# Patient Record
Sex: Female | Born: 1970 | Race: White | Hispanic: No | Marital: Single | Smoking: Current every day smoker
Health system: Southern US, Community
[De-identification: ages and names within clinical notes are randomized; demographics above are authoritative.]

## PROBLEM LIST (undated history)

## (undated) DIAGNOSIS — I8500 Esophageal varices without bleeding: Secondary | ICD-10-CM

## (undated) DIAGNOSIS — J45909 Unspecified asthma, uncomplicated: Secondary | ICD-10-CM

## (undated) DIAGNOSIS — R161 Splenomegaly, not elsewhere classified: Secondary | ICD-10-CM

## (undated) DIAGNOSIS — T7840XA Allergy, unspecified, initial encounter: Secondary | ICD-10-CM

## (undated) DIAGNOSIS — R059 Cough, unspecified: Secondary | ICD-10-CM

## (undated) DIAGNOSIS — G43909 Migraine, unspecified, not intractable, without status migrainosus: Secondary | ICD-10-CM

## (undated) DIAGNOSIS — F1010 Alcohol abuse, uncomplicated: Secondary | ICD-10-CM

## (undated) DIAGNOSIS — F32A Depression, unspecified: Secondary | ICD-10-CM

## (undated) DIAGNOSIS — D696 Thrombocytopenia, unspecified: Secondary | ICD-10-CM

## (undated) DIAGNOSIS — F419 Anxiety disorder, unspecified: Secondary | ICD-10-CM

## (undated) DIAGNOSIS — E119 Type 2 diabetes mellitus without complications: Secondary | ICD-10-CM

## (undated) DIAGNOSIS — E669 Obesity, unspecified: Secondary | ICD-10-CM

## (undated) DIAGNOSIS — A630 Anogenital (venereal) warts: Secondary | ICD-10-CM

## (undated) DIAGNOSIS — K7460 Unspecified cirrhosis of liver: Secondary | ICD-10-CM

## (undated) DIAGNOSIS — M1990 Unspecified osteoarthritis, unspecified site: Secondary | ICD-10-CM

## (undated) DIAGNOSIS — K8020 Calculus of gallbladder without cholecystitis without obstruction: Secondary | ICD-10-CM

## (undated) DIAGNOSIS — K219 Gastro-esophageal reflux disease without esophagitis: Secondary | ICD-10-CM

## (undated) DIAGNOSIS — I10 Essential (primary) hypertension: Secondary | ICD-10-CM

## (undated) DIAGNOSIS — G5603 Carpal tunnel syndrome, bilateral upper limbs: Secondary | ICD-10-CM

## (undated) HISTORY — DX: Calculus of gallbladder without cholecystitis without obstruction: K80.20

## (undated) HISTORY — DX: Unspecified osteoarthritis, unspecified site: M19.90

## (undated) HISTORY — DX: Gastro-esophageal reflux disease without esophagitis: K21.9

## (undated) HISTORY — DX: Unspecified asthma, uncomplicated: J45.909

## (undated) HISTORY — DX: Allergy, unspecified, initial encounter: T78.40XA

## (undated) HISTORY — DX: Anogenital (venereal) warts: A63.0

## (undated) HISTORY — DX: Alcohol abuse, uncomplicated: F10.10

## (undated) HISTORY — DX: Migraine, unspecified, not intractable, without status migrainosus: G43.909

## (undated) HISTORY — DX: Anxiety disorder, unspecified: F41.9

## (undated) HISTORY — DX: Obesity, unspecified: E66.9

## (undated) HISTORY — DX: Depression, unspecified: F32.A

## (undated) HISTORY — DX: Type 2 diabetes mellitus without complications: E11.9

---

## 2007-11-18 ENCOUNTER — Other Ambulatory Visit: Payer: Self-pay

## 2007-11-18 ENCOUNTER — Emergency Department: Payer: Self-pay | Admitting: Emergency Medicine

## 2007-11-22 ENCOUNTER — Emergency Department: Payer: Self-pay | Admitting: Emergency Medicine

## 2012-01-30 ENCOUNTER — Emergency Department: Payer: Self-pay | Admitting: Emergency Medicine

## 2012-02-22 ENCOUNTER — Ambulatory Visit: Payer: Self-pay

## 2015-02-05 ENCOUNTER — Encounter: Payer: Self-pay | Admitting: Emergency Medicine

## 2015-02-05 ENCOUNTER — Emergency Department
Admission: EM | Admit: 2015-02-05 | Discharge: 2015-02-05 | Disposition: A | Discharge status: Home / Self Care | Payer: Self-pay | Attending: Emergency Medicine | Admitting: Emergency Medicine

## 2015-02-05 ENCOUNTER — Emergency Department: Payer: Self-pay

## 2015-02-05 DIAGNOSIS — E1165 Type 2 diabetes mellitus with hyperglycemia: Secondary | ICD-10-CM | POA: Insufficient documentation

## 2015-02-05 DIAGNOSIS — Z880 Allergy status to penicillin: Secondary | ICD-10-CM | POA: Insufficient documentation

## 2015-02-05 DIAGNOSIS — I10 Essential (primary) hypertension: Secondary | ICD-10-CM | POA: Insufficient documentation

## 2015-02-05 DIAGNOSIS — Z720 Tobacco use: Secondary | ICD-10-CM | POA: Insufficient documentation

## 2015-02-05 HISTORY — DX: Essential (primary) hypertension: I10

## 2015-02-05 LAB — CBC WITH DIFFERENTIAL/PLATELET
BASOS PCT: 1 %
Basophils Absolute: 0.1 10*3/uL (ref 0–0.1)
EOS ABS: 0.1 10*3/uL (ref 0–0.7)
EOS PCT: 1 %
HCT: 44.1 % (ref 35.0–47.0)
Hemoglobin: 14.9 g/dL (ref 12.0–16.0)
Lymphocytes Relative: 33 %
Lymphs Abs: 2.5 10*3/uL (ref 1.0–3.6)
MCH: 29.5 pg (ref 26.0–34.0)
MCHC: 33.8 g/dL (ref 32.0–36.0)
MCV: 87.4 fL (ref 80.0–100.0)
Monocytes Absolute: 0.4 10*3/uL (ref 0.2–0.9)
Monocytes Relative: 6 %
Neutro Abs: 4.5 10*3/uL (ref 1.4–6.5)
Neutrophils Relative %: 59 %
PLATELETS: 110 10*3/uL — AB (ref 150–440)
RBC: 5.04 MIL/uL (ref 3.80–5.20)
RDW: 13.6 % (ref 11.5–14.5)
WBC: 7.5 10*3/uL (ref 3.6–11.0)

## 2015-02-05 LAB — BASIC METABOLIC PANEL
Anion gap: 9 (ref 5–15)
BUN: 10 mg/dL (ref 6–20)
CALCIUM: 9 mg/dL (ref 8.9–10.3)
CO2: 26 mmol/L (ref 22–32)
CREATININE: 0.63 mg/dL (ref 0.44–1.00)
Chloride: 97 mmol/L — ABNORMAL LOW (ref 101–111)
GFR calc Af Amer: >60 mL/min (ref >60)
GLUCOSE: 357 mg/dL — AB (ref 65–99)
Potassium: 3.8 mmol/L (ref 3.5–5.1)
SODIUM: 132 mmol/L — AB (ref 135–145)

## 2015-02-05 LAB — BRAIN NATRIURETIC PEPTIDE: B Natriuretic Peptide: 9 pg/mL (ref 0.0–100.0)

## 2015-02-05 LAB — TROPONIN I: Troponin I: <0.03 ng/mL (ref <0.031)

## 2015-02-05 IMAGING — CR DG CHEST 2V
1 series · 2 of 2 positions shown · non-contrast
Comparison: None.

CLINICAL DATA: Dyspnea, rapid heart beat

EXAM:
CHEST  2 VIEW

[Series 1: dg chest 2 view · 0.14mm/px · 2 of 2 slices shown]
[im 1/2]
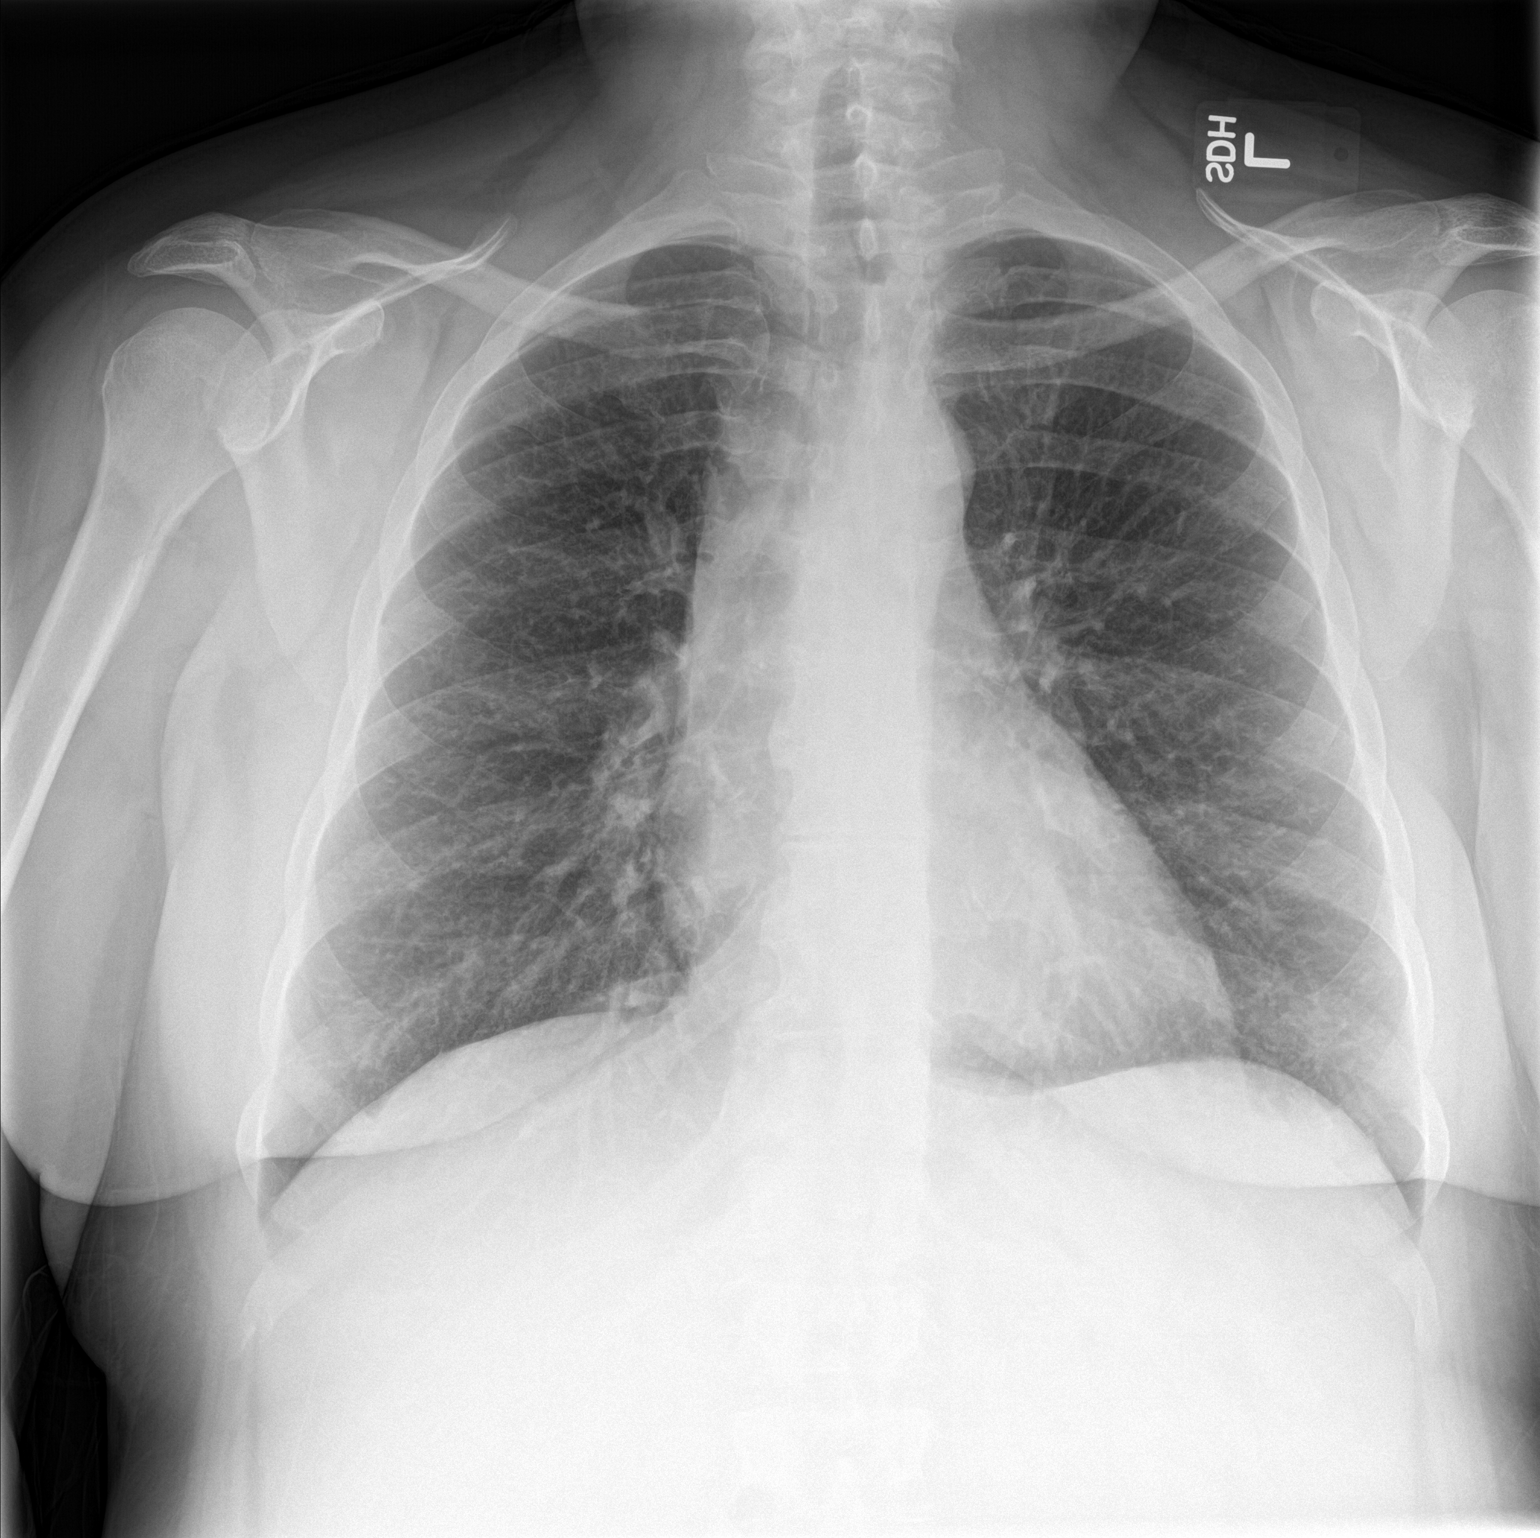
[im 2/2]
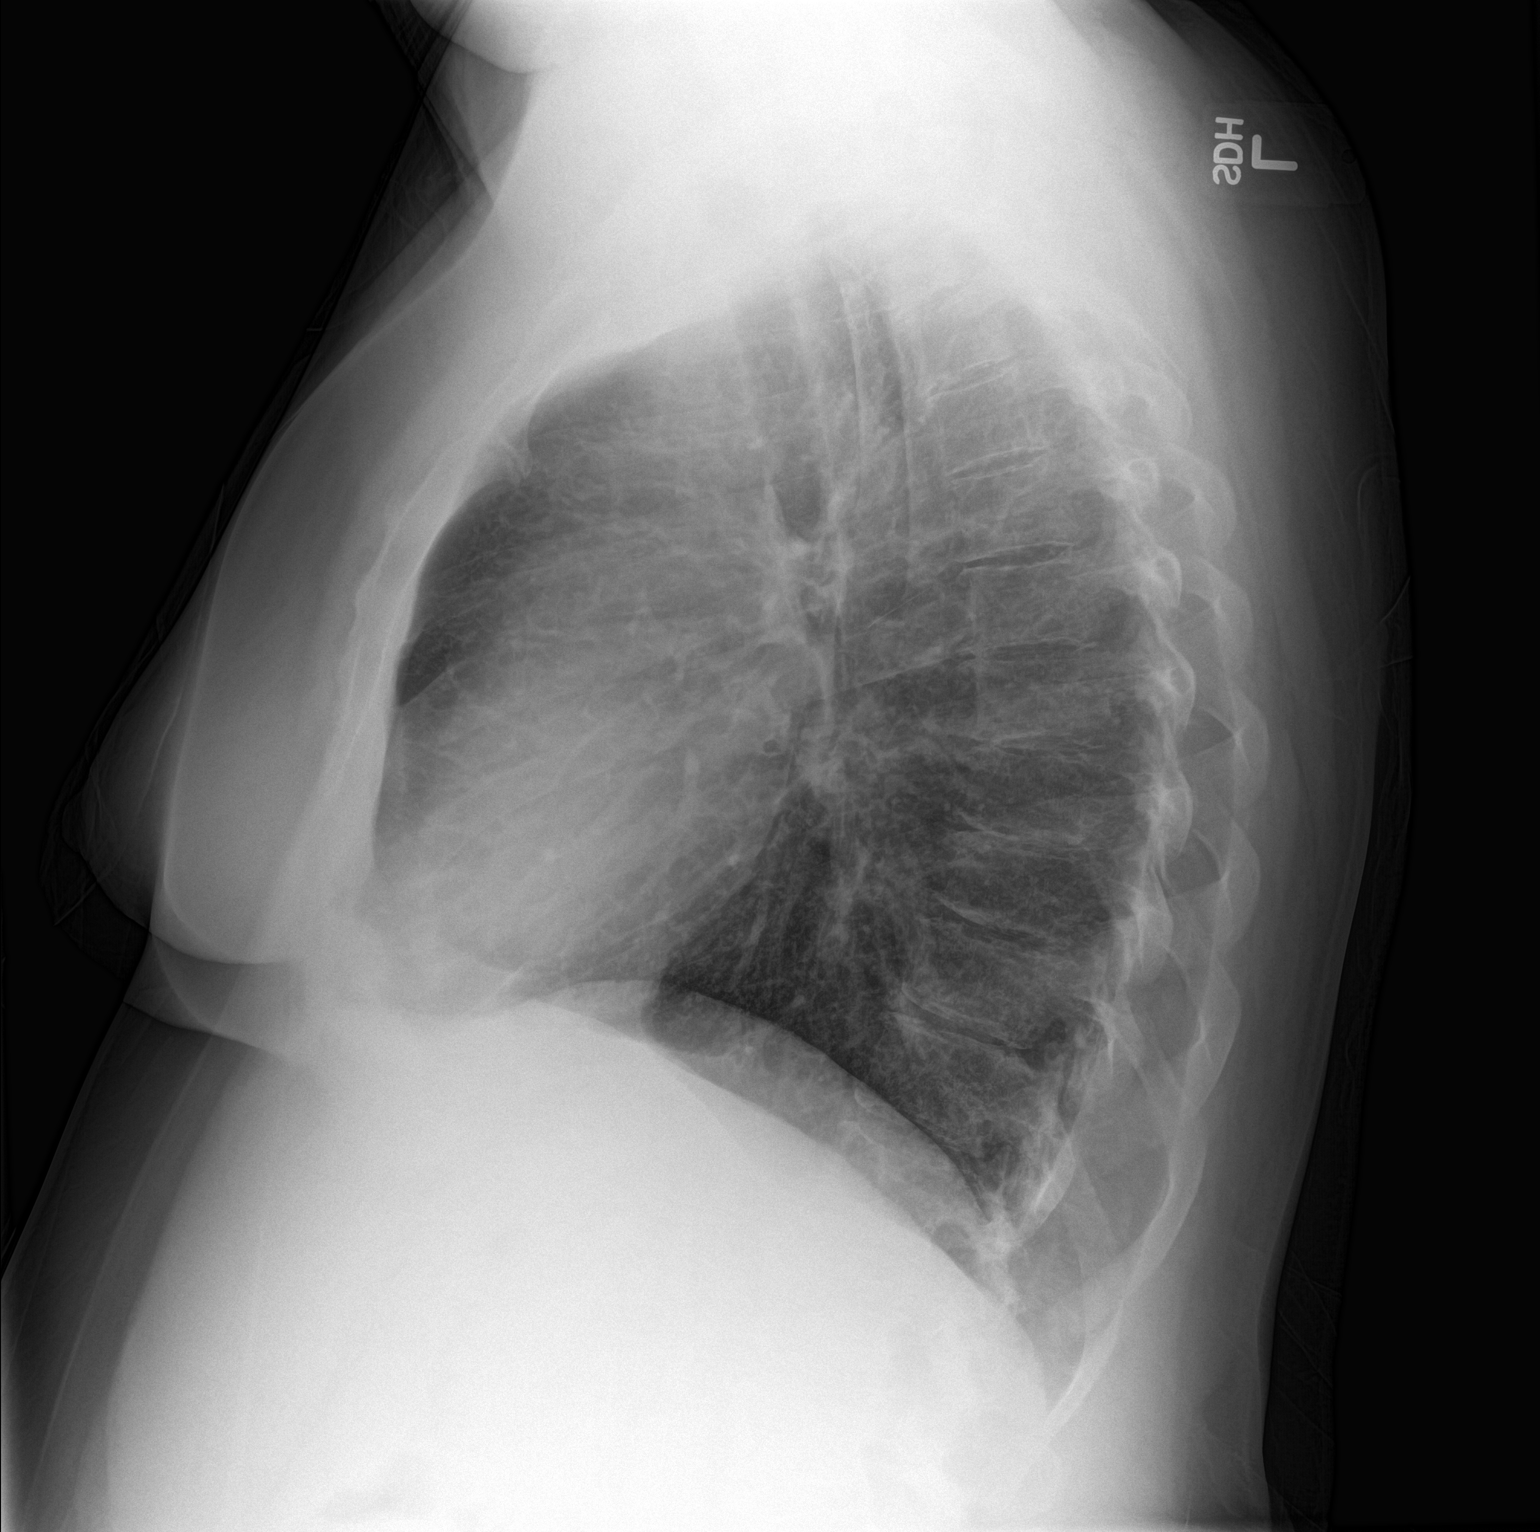

[2 of 2 positions shown; findings below may reference images not displayed]

FINDINGS: Cardiomediastinal silhouette is unremarkable. No acute infiltrate or
pleural effusion. No pulmonary edema. Mild degenerative changes
thoracic spine.
IMPRESSION: No active cardiopulmonary disease.

## 2015-02-05 MED ORDER — METFORMIN HCL 500 MG PO TABS: 500.0000 mg | ORAL_TABLET | Freq: Two times a day (BID) | ORAL | Status: DC

## 2015-02-05 MED ORDER — DIAZEPAM 5 MG PO TABS: 5.0000 mg | ORAL_TABLET | Freq: Three times a day (TID) | ORAL | Status: DC | PRN

## 2015-02-05 NOTE — ED Provider Notes (Signed)
[facility] Emergency Department Provider Note     Time seen: ----------------------------------------- 3:38 PM on 02/05/2015 -----------------------------------------    I have reviewed the triage vital signs and the nursing notes.   HISTORY  Chief Complaint Cough    HPI Alexandria Leblanc is a 44 y.o. female who presents ER for cough and fever for about 3 weeks. Patient states she has several attacks of chest pain and cough with shortness of breath. Patient states it lasts several minutes, nothing makes it better. Exertion seems to make her symptoms worse. She has noticed this over the last 3 weeks, prior to this she did not have this problem   Past Medical History  Diagnosis Date  . Hypertension     There are no active problems to display for this patient.   History reviewed. No pertinent past surgical history.  Allergies Penicillins and Sulfa antibiotics  Social History Social History  Substance Use Topics  . Smoking status: Current Every Day Smoker  . Smokeless tobacco: None  . Alcohol Use: Yes    Review of Systems Constitutional: Positive for fever Eyes: Negative for visual changes. ENT: Negative for sore throat. Cardiovascular: Positive for pleuritic chest pain Respiratory: Positive shortness of breath Gastrointestinal: Negative for abdominal pain, vomiting and diarrhea. Genitourinary: Negative for dysuria. Musculoskeletal: Negative for back pain. Skin: Negative for rash. Neurological: Negative for headaches, focal weakness or numbness.  10-point ROS otherwise negative.  ____________________________________________   PHYSICAL EXAM:  VITAL SIGNS: ED Triage Vitals  Enc Vitals Group     BP 02/05/15 1512 147/83 mmHg     Pulse Rate 02/05/15 1512 73     Resp 02/05/15 1512 20     Temp 02/05/15 1512 99.2 F (37.3 C)     Temp Source 02/05/15 1512 Oral     SpO2 02/05/15 1512 95 %     Weight 02/05/15 1512 198 lb (89.812 kg)      Height 02/05/15 1512 5\' 4"  (1.626 m)     Head Cir --      Peak Flow --      Pain Score --      Pain Loc --      Pain Edu? --      Excl. in [place]? --     Constitutional: Alert and oriented. Well appearing and in no distress. Eyes: Conjunctivae are normal. PERRL. Normal extraocular movements. ENT   Head: Normocephalic and atraumatic.   Nose: No congestion/rhinnorhea.   Mouth/Throat: Mucous membranes are moist.   Neck: No stridor. Cardiovascular: Normal rate, regular rhythm. Normal and symmetric distal pulses are present in all extremities. No murmurs, rubs, or gallops. Respiratory: Normal respiratory effort without tachypnea nor retractions. Breath sounds are clear and equal bilaterally. No wheezes/rales/rhonchi. Gastrointestinal: Soft and nontender. No distention. No abdominal bruits.  Musculoskeletal: Nontender with normal range of motion in all extremities. No joint effusions.  No lower extremity tenderness nor edema. Neurologic:  Normal speech and language. No gross focal neurologic deficits are appreciated. Speech is normal. No gait instability. Skin:  Skin is warm, dry and intact. No rash noted. Psychiatric: Mood and affect are normal. Speech and behavior are normal. Patient exhibits appropriate insight and judgment. ____________________________________________  EKG: Interpreted by me. Normal sinus rhythm with normal axis normal intervals. No evidence of hypertrophy or acute infarction. EKG is unchanged from June 2009  ____________________________________________  ED COURSE:  Pertinent labs & imaging results that were available during my care of the patient were reviewed by me and  considered in my medical decision making (see chart for details). Patient is in no acute distress, will check cardiac labs and x-ray and reevaluate. ____________________________________________    LABS (pertinent positives/negatives)  Labs Reviewed  CBC WITH DIFFERENTIAL/PLATELET -  Abnormal; Notable for the following:    Platelets 110 (*)    All other components within normal limits  BASIC METABOLIC PANEL - Abnormal; Notable for the following:    Sodium 132 (*)    Chloride 97 (*)    Glucose, Bld 357 (*)    All other components within normal limits  BRAIN NATRIURETIC PEPTIDE  TROPONIN I    RADIOLOGY   Chest x-ray Is unremarkable ____________________________________________  FINAL ASSESSMENT AND PLAN  Cough, hyperglycemia  Plan: Patient with labs and imaging as dictated above. Patient apparently with new onset diabetes. I wonder also if her symptoms are anxiety related. She'll be discharged with Valium and metformin. She is advised to follow-up with her doctor in the next week.   Earleen Newport, MD   Earleen Newport, MD 02/05/15 [id number]

## 2015-02-05 NOTE — ED Notes (Signed)
Pt presents with cough, fever for three weeks.

## 2015-02-05 NOTE — Discharge Instructions (Signed)
Blood Glucose Monitoring °Monitoring your blood glucose (also know as blood sugar) helps you to manage your diabetes. It also helps you and your health care provider monitor your diabetes and determine how well your treatment plan is working. °WHY SHOULD YOU MONITOR YOUR BLOOD GLUCOSE? °· It can help you understand how food, exercise, and medicine affect your blood glucose. °· It allows you to know what your blood glucose is at any given moment. You can quickly tell if you are having low blood glucose (hypoglycemia) or high blood glucose (hyperglycemia). °· It can help you and your health care provider know how to adjust your medicines. °· It can help you understand how to manage an illness or adjust medicine for exercise. °WHEN SHOULD YOU TEST? °Your health care provider will help you decide how often you should check your blood glucose. This may depend on the type of diabetes you have, your diabetes control, or the types of medicines you are taking. Be sure to write down all of your blood glucose readings so that this information can be reviewed with your health care provider. See below for examples of testing times that your health care provider may suggest. °Type 1 Diabetes °· Test 4 times a day if you are in good control, using an insulin pump, or perform multiple daily injections. °· If your diabetes is not well controlled or if you are sick, you may need to monitor more often. °· It is a good idea to also monitor: °¨ Before and after exercise. °¨ Between meals and 2 hours after a meal. °¨ Occasionally between 2:00 a.m. and 3:00 a.m. °Type 2 Diabetes °· It can vary with each person, but generally, if you are on insulin, test 4 times a day. °· If you take medicines by mouth (orally), test 2 times a day. °· If you are on a controlled diet, test once a day. °· If your diabetes is not well controlled or if you are sick, you may need to monitor more often. °HOW TO MONITOR YOUR BLOOD GLUCOSE °Supplies  Needed °· Blood glucose meter. °· Test strips for your meter. Each meter has its own strips. You must use the strips that go with your own meter. °· A pricking needle (lancet). °· A device that holds the lancet (lancing device). °· A journal or log book to write down your results. °Procedure °· Wash your hands with soap and water. Alcohol is not preferred. °· Prick the side of your finger (not the tip) with the lancet. °· Gently milk the finger until a small drop of blood appears. °· Follow the instructions that come with your meter for inserting the test strip, applying blood to the strip, and using your blood glucose meter. °Other Areas to Get Blood for Testing °Some meters allow you to use other areas of your body (other than your finger) to test your blood. These areas are called alternative sites. The most common alternative sites are: °· The forearm. °· The thigh. °· The back area of the lower leg. °· The palm of the hand. °The blood flow in these areas is slower. Therefore, the blood glucose values you get may be delayed, and the numbers are different from what you would get from your fingers. Do not use alternative sites if you think you are having hypoglycemia. Your reading will not be accurate. Always use a finger if you are having hypoglycemia. Also, if you cannot feel your lows (hypoglycemia unawareness), always use your fingers for your   blood glucose checks. °ADDITIONAL TIPS FOR GLUCOSE MONITORING °· Do not reuse lancets. °· Always carry your supplies with you. °· All blood glucose meters have a 24-hour "hotline" number to call if you have questions or need help. °· Adjust (calibrate) your blood glucose meter with a control solution after finishing a few boxes of strips. °BLOOD GLUCOSE RECORD KEEPING °It is a good idea to keep a daily record or log of your blood glucose readings. Most glucose meters, if not all, keep your glucose records stored in the meter. Some meters come with the ability to download  your records to your home computer. Keeping a record of your blood glucose readings is especially helpful if you are wanting to look for patterns. Make notes to go along with the blood glucose readings because you might forget what happened at that exact time. Keeping good records helps you and your health care provider to work together to achieve good diabetes management.  °Document Released: 05/20/2003 Document Revised: 10/01/2013 Document Reviewed: 10/09/2012 °ExitCare® Patient Information ©2015 [employer]. This information is not intended to replace advice given to you by your health care provider. Make sure you discuss any questions you have with your health care provider. ° °Diabetes and Exercise °Exercising regularly is important. It is not just about losing weight. It has many health benefits, such as: °· Improving your overall fitness, flexibility, and endurance. °· Increasing your bone density. °· Helping with weight control. °· Decreasing your body fat. °· Increasing your muscle strength. °· Reducing stress and tension. °· Improving your overall health. °People with diabetes who exercise gain additional benefits because exercise: °· Reduces appetite. °· Improves the body's use of blood sugar (glucose). °· Helps lower or control blood glucose. °· Decreases blood pressure. °· Helps control blood lipids (such as cholesterol and triglycerides). °· Improves the body's use of the hormone insulin by: °¨ Increasing the body's insulin sensitivity. °¨ Reducing the body's insulin needs. °· Decreases the risk for heart disease because exercising: °¨ Lowers cholesterol and triglycerides levels. °¨ Increases the levels of good cholesterol (such as high-density lipoproteins [HDL]) in the body. °¨ Lowers blood glucose levels. °YOUR ACTIVITY PLAN  °Choose an activity that you enjoy and set realistic goals. Your health care provider or diabetes educator can help you make an activity plan that works for you. Exercise  regularly as directed by your health care provider. This includes: °· Performing resistance training twice a week such as push-ups, sit-ups, lifting weights, or using resistance bands. °· Performing 150 minutes of cardio exercises each week such as walking, running, or playing sports. °· Staying active and spending no more than 90 minutes at one time being inactive. °Even short bursts of exercise are good for you. Three 10-minute sessions spread throughout the day are just as beneficial as a single 30-minute session. Some exercise ideas include: °· Taking the dog for a walk. °· Taking the stairs instead of the elevator. °· Dancing to your favorite song. °· Doing an exercise video. °· Doing your favorite exercise with a friend. °RECOMMENDATIONS FOR EXERCISING WITH TYPE 1 OR TYPE 2 DIABETES  °· Check your blood glucose before exercising. If blood glucose levels are greater than 240 mg/dL, check for urine ketones. Do not exercise if ketones are present. °· Avoid injecting insulin into areas of the body that are going to be exercised. For example, avoid injecting insulin into: °¨ The arms when playing tennis. °¨ The legs when jogging. °· Keep a record of: °¨   Food intake before and after you exercise. °¨ Expected peak times of insulin action. °¨ Blood glucose levels before and after you exercise. °¨ The type and amount of exercise you have done. °· Review your records with your health care provider. Your health care provider will help you to develop guidelines for adjusting food intake and insulin amounts before and after exercising. °· If you take insulin or oral hypoglycemic agents, watch for signs and symptoms of hypoglycemia. They include: °¨ Dizziness. °¨ Shaking. °¨ Sweating. °¨ Chills. °¨ Confusion. °· Drink plenty of water while you exercise to prevent dehydration or heat stroke. Body water is lost during exercise and must be replaced. °· Talk to your health care provider before starting an exercise program to  make sure it is safe for you. Remember, almost any type of activity is better than none. °Document Released: 08/07/2003 Document Revised: 10/01/2013 Document Reviewed: 10/24/2012 °ExitCare® Patient Information ©2015 [employer]. This information is not intended to replace advice given to you by your health care provider. Make sure you discuss any questions you have with your health care provider. ° °

## 2015-08-18 ENCOUNTER — Telehealth: Payer: Self-pay | Admitting: Primary Care

## 2015-08-18 ENCOUNTER — Ambulatory Visit (INDEPENDENT_AMBULATORY_CARE_PROVIDER_SITE_OTHER): Payer: Self-pay | Admitting: Primary Care

## 2015-08-18 ENCOUNTER — Encounter: Payer: Self-pay | Admitting: Primary Care

## 2015-08-18 VITALS — BP 154/94 | HR 71 | Temp 98.2°F | Ht 63.0 in | Wt 202.0 lb

## 2015-08-18 DIAGNOSIS — A6000 Herpesviral infection of urogenital system, unspecified: Secondary | ICD-10-CM

## 2015-08-18 DIAGNOSIS — E119 Type 2 diabetes mellitus without complications: Secondary | ICD-10-CM

## 2015-08-18 DIAGNOSIS — I10 Essential (primary) hypertension: Secondary | ICD-10-CM

## 2015-08-18 DIAGNOSIS — J4540 Moderate persistent asthma, uncomplicated: Secondary | ICD-10-CM

## 2015-08-18 DIAGNOSIS — E1165 Type 2 diabetes mellitus with hyperglycemia: Secondary | ICD-10-CM | POA: Insufficient documentation

## 2015-08-18 DIAGNOSIS — K219 Gastro-esophageal reflux disease without esophagitis: Secondary | ICD-10-CM

## 2015-08-18 LAB — HEMOGLOBIN A1C: HEMOGLOBIN A1C: 6.9 % — AB (ref 4.6–6.5)

## 2015-08-18 LAB — COMPREHENSIVE METABOLIC PANEL
ALT: 29 U/L (ref 0–35)
AST: 25 U/L (ref 0–37)
Albumin: 4.1 g/dL (ref 3.5–5.2)
Alkaline Phosphatase: 119 U/L — ABNORMAL HIGH (ref 39–117)
BILIRUBIN TOTAL: 0.4 mg/dL (ref 0.2–1.2)
BUN: 8 mg/dL (ref 6–23)
CO2: 29 meq/L (ref 19–32)
Calcium: 9.4 mg/dL (ref 8.4–10.5)
Chloride: 98 mEq/L (ref 96–112)
Creatinine, Ser: 0.53 mg/dL (ref 0.40–1.20)
GFR: 132.54 mL/min (ref >60.00)
GLUCOSE: 157 mg/dL — AB (ref 70–99)
Potassium: 4.1 mEq/L (ref 3.5–5.1)
SODIUM: 133 meq/L — AB (ref 135–145)
Total Protein: 7.7 g/dL (ref 6.0–8.3)

## 2015-08-18 LAB — LIPID PANEL
Cholesterol: 260 mg/dL — ABNORMAL HIGH (ref 0–200)
HDL: 40.6 mg/dL (ref >39.00)
NONHDL: 219.19
Total CHOL/HDL Ratio: 6
Triglycerides: 253 mg/dL — ABNORMAL HIGH (ref 0.0–149.0)
VLDL: 50.6 mg/dL — ABNORMAL HIGH (ref 0.0–40.0)

## 2015-08-18 LAB — LDL CHOLESTEROL, DIRECT: Direct LDL: 157 mg/dL

## 2015-08-18 MED ORDER — ACYCLOVIR 400 MG PO TABS: 400.0000 mg | ORAL_TABLET | Freq: Two times a day (BID) | ORAL | Status: DC

## 2015-08-18 MED ORDER — HYDROCHLOROTHIAZIDE 25 MG PO TABS: 25.0000 mg | ORAL_TABLET | Freq: Every day | ORAL | Status: DC

## 2015-08-18 MED ORDER — LOSARTAN POTASSIUM 50 MG PO TABS: 50.0000 mg | ORAL_TABLET | Freq: Every day | ORAL | Status: DC

## 2015-08-18 MED ORDER — OMEPRAZOLE 20 MG PO CPDR: 20.0000 mg | DELAYED_RELEASE_CAPSULE | Freq: Every day | ORAL | Status: DC

## 2015-08-18 MED ORDER — FLUTICASONE FUROATE-VILANTEROL 100-25 MCG/INH IN AEPB: 1.0000 | INHALATION_SPRAY | Freq: Every day | RESPIRATORY_TRACT | Status: DC

## 2015-08-18 MED ORDER — ALBUTEROL SULFATE HFA 108 (90 BASE) MCG/ACT IN AERS: 2.0000 | INHALATION_SPRAY | Freq: Four times a day (QID) | RESPIRATORY_TRACT | Status: DC | PRN

## 2015-08-18 NOTE — Patient Instructions (Addendum)
Refills were sent to your pharmacy for the Losartan, HCTZ, Acyclovir, albuterol inhaler, and omeprazole.  Start [employer]. Inhale 1 puff once every morning for Emphysema/Asthma.  Use the albuterol inhaler as needed for shortness of breath/wheezing.  Complete lab work prior to leaving today. I will notify you of your results once received.   Please schedule a physical with me within the next 3-6 months. You may also schedule a lab only appointment 3-4 days prior. We will discuss your lab results in detail during your physical.  It was a pleasure to meet you today! Please don't hesitate to call me with any questions. Welcome to [facility]!

## 2015-08-18 NOTE — Assessment & Plan Note (Signed)
Diagnosed in the ED in September 2016. Currently managed on Metformin BID. A1C due and is pending. Lipid panel pending.

## 2015-08-18 NOTE — Progress Notes (Signed)
Subjective:    Patient ID: Alexandria Leblanc, female    DOB: 24-Nov-1970, 45 y.o.   MRN: [id number]  HPI  Alexandria Leblanc is a 45 year old female who presents today to establish care and discuss the problems mentioned below. Will obtain old records.  1) Essential Hypertension: Currently managed on HCTZ 12.5 mg and Losartan 50 mg. BP is elevated today in the clinic at 154/94. She's taken HCTZ 12.5 mg this morning but has not had her Losartan in 2 weeks. Denies chest pain. Occasional dizziness, regular headaches.   2) GERD: Diagnosed 10 years ago. Currently managed on Omeprazole 20 mg. She's never had an endoscopy. She's tried going off of her omeprazole in the past but will experience indigestion.   3) Type 2 Diabetes: Diagnosed in September 2016. Currently managed on Metformin 500 mg BID. She's been taking her medication daily as prescribed. She's unsure of the date of her last A1C, believes it was over 3 months ago.  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 She was evaluated at the [facility] years ago and was told she had a genital wart. She's had this since her 17's. She's noticed it enlarging over the past 10 years. She's been taking her acyclovir scheduled for 10 years.   5) Asthma/Emphysema: Diagnosed years ago and is currently managed on Ventolin that she will use everyday. She is a current smoker and has smoked for the past 30 years. She is currently smoking 1.5 PPD. She experiences SOB upon exertion. She also experiences daily cough during the morning and at night, and also wheezing.   Review of Systems  Constitutional: Negative for fever.  Respiratory: Positive for shortness of breath and wheezing.   Cardiovascular: Negative for chest pain.  Gastrointestinal:       GERD  Endocrine: Negative for polyuria.  Genitourinary:       Endorses genital wart x 10 years  Allergic/Immunologic: Positive for environmental allergies.    Neurological: Positive for headaches.       Past Medical History  Diagnosis Date  . Hypertension   . GERD (gastroesophageal reflux disease)   . Type 2 diabetes mellitus ([place])     Social History   Social History  . Marital Status: Unknown    Spouse Name: N/A  . Number of Children: N/A  . Years of Education: N/A   Occupational History  . Not on file.   Social History Main Topics  . Smoking status: Current Every Day Smoker  . Smokeless tobacco: Not on file  . Alcohol Use: Yes  . Drug Use: Not on file  . Sexual Activity: Not on file   Other Topics Concern  . Not on file   Social History Narrative   Single.   No children.   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           No past surgical history on file.  Family History  Problem Relation Age of Onset  . Heart disease Mother   . COPD Father   . Hypertension Mother   . Hypertension Father   . Hyperlipidemia Brother   . Prostate cancer Paternal Uncle     Allergies  Allergen Reactions  . Penicillins Hives and Itching  . Sulfa Antibiotics     Current Outpatient Prescriptions on File Prior to Visit  Medication Sig Dispense Refill  . metFORMIN (GLUCOPHAGE) 500 MG tablet  Take 1 tablet (500 mg total) by mouth 2 (two) times daily with a meal. 60 tablet 11   No current facility-administered medications on file prior to visit.    BP 154/94 mmHg  Pulse 71  Temp(Src) 98.2 F (36.8 C) (Oral)  Ht 5\' 3"  (1.6 m)  Wt 202 lb (91.627 kg)  BMI 35.79 kg/m2  SpO2 99%  LMP 08/14/2015    Objective:   Physical Exam  Constitutional: She appears well-nourished.  Neck: Neck supple.  Cardiovascular: Normal rate and regular rhythm.   Pulmonary/Chest: Effort normal and breath sounds normal. She has no wheezes.  Skin: Skin is warm and dry.  Psychiatric: She has a normal mood and affect.          Assessment & Plan:

## 2015-08-18 NOTE — Telephone Encounter (Signed)
Please notify Ms. Eppler that i've reviewed her records and they are ready for pick up at her convenience.

## 2015-08-18 NOTE — Assessment & Plan Note (Signed)
Diagnosed in her 56's. Takes scheduled acylovir and has for past 7 years, wanting refills today. Endorses genital wart to vagina x 10 years. Would like this removed. Will consider removal at upcoming physical.

## 2015-08-18 NOTE — Assessment & Plan Note (Signed)
Diagnosed years ago. Currently managed on HCTZ  25 mg, but has taken 1/2 tablet daily due to muscle cramps. Also on Losartan 50 but has been out for 2 weeks. Refills provided on both. CMP pending today. Will continue to monitor BP.

## 2015-08-18 NOTE — Progress Notes (Signed)
Pre visit review using our clinic review tool, if applicable. No additional management support is needed unless otherwise documented below in the visit note. 

## 2015-08-18 NOTE — Assessment & Plan Note (Signed)
Managed on omeprazole 20 mg for years. No endoscopy. Will experience symptoms without meds. Continue meds for now.

## 2015-08-18 NOTE — Assessment & Plan Note (Signed)
Diagnosed years ago, was once told she had emphysema, also smoker for 30+ years. Daily use of Ventolin inhaler. Daily cough, SOB, wheezing. Symptoms consistent with moderate persistent asthma/emphysema. Start [employer] daily.  Will obtain update in 4 weeks.

## 2015-08-19 ENCOUNTER — Telehealth: Payer: Self-pay | Admitting: Primary Care

## 2015-08-19 ENCOUNTER — Other Ambulatory Visit: Payer: Self-pay | Admitting: Primary Care

## 2015-08-19 DIAGNOSIS — E785 Hyperlipidemia, unspecified: Secondary | ICD-10-CM

## 2015-08-19 MED ORDER — PRAVASTATIN SODIUM 40 MG PO TABS: 40.0000 mg | ORAL_TABLET | Freq: Every day | ORAL | Status: DC

## 2015-08-19 NOTE — Telephone Encounter (Signed)
Called and notified patient of Kate's comments. Patient verbalized understanding.  

## 2015-08-19 NOTE — Telephone Encounter (Signed)
Pt returned your call - please call [phone] Thanks

## 2015-08-19 NOTE — Telephone Encounter (Signed)
Called and notified patient of Alexandria Leblanc's comments. Patient verbalized understanding.  

## 2015-08-19 NOTE — Telephone Encounter (Signed)
Message left for patient to return my call.  

## 2015-09-01 ENCOUNTER — Telehealth: Payer: Self-pay | Admitting: Primary Care

## 2015-09-01 NOTE — Telephone Encounter (Signed)
I wish she would have notified us of the cost, we would have changed it sooner. Is she continuing to use her Ventolin inhaler daily? Any improvement in her breathing since her last visit? If not then I recommend we try another inhaler that is similar to [facility] that may be covered by her insurance. If she's agreeable I will send. Please have her notify us immediatly if the inhaler is too expensive.

## 2015-09-01 NOTE — Telephone Encounter (Signed)
Will you please call and check on Alexandria Leblanc. How's her breathing since starting the inhaler?

## 2015-09-01 NOTE — Telephone Encounter (Signed)
Spoken to patient. She stated that she is using the Ventolin daily. Patient also stated that she does not have insurance and would have to pay out of pocket.

## 2015-09-01 NOTE — Telephone Encounter (Signed)
Called and ask patient about her breathing with the [facility]. Patient stated that she did not purchased the inhaler, it was over $300.

## 2015-09-02 NOTE — Telephone Encounter (Signed)
Will send this to [facility] to see if she can offer any assistance. Rose, see notes below. This patient needs a Breo inhaler, she does not have insurance and cannot afford the cash price.  Would you mind taking a look at her options?

## 2015-09-04 NOTE — Telephone Encounter (Signed)
I have coupons for Breo, but they only benefit uninsured patients up to $100 off the cash price, so still too expensive.  I did find a web-site (https://[email or web address].com/eligibility) for patient assistance.  According to the chart and patient's income, she qualified for assistance, but I advised her she will need to go online and answer some further questions to see if she qualifies or not.  Hopefully, she will be able to get assistance that way.  I also asked her to let us know and if she doesn't qualify you may be able to call in a less expensive inhaler.  Hope this helps.  Thank you!

## 2015-09-04 NOTE — Telephone Encounter (Signed)
Noted. Will follow up in 1 week.

## 2015-09-08 ENCOUNTER — Telehealth: Payer: Self-pay

## 2015-09-08 DIAGNOSIS — J4540 Moderate persistent asthma, uncomplicated: Secondary | ICD-10-CM

## 2015-09-08 MED ORDER — ALBUTEROL SULFATE HFA 108 (90 BASE) MCG/ACT IN AERS: 2.0000 | INHALATION_SPRAY | Freq: Four times a day (QID) | RESPIRATORY_TRACT | Status: DC | PRN

## 2015-09-08 MED ORDER — FLUTICASONE FUROATE-VILANTEROL 100-25 MCG/INH IN AEPB: 1.0000 | INHALATION_SPRAY | Freq: Every day | RESPIRATORY_TRACT | Status: DC

## 2015-09-08 NOTE — Telephone Encounter (Signed)
Noted. Looks like there's a place for an advocate to place their information in section 5. Rose, do you know anything about that? Place forms and RX's on Rose's desk, please return to [place] as patient will require completed forms once we are done.

## 2015-09-08 NOTE — Telephone Encounter (Signed)
Alexandria Leblanc [phone]  [name] dropped off some forms to be filled out by doctor for her to get [place] Patient Assistance Program. Placed in RX box up front.

## 2015-09-08 NOTE — Telephone Encounter (Signed)
Placed forms in [place] for review and complete.

## 2015-09-17 NOTE — Telephone Encounter (Signed)
Great. Thank you, Rose. I appreciate your hard work to help this patient.

## 2015-09-17 NOTE — Telephone Encounter (Signed)
I have been working on this since last week.  I've been trying to get the shipping address changed from our address to patient's address.  They kept telling me someone would call back, but no one ever did.  This morning I called right at 8:00 and got Alexandria Leblanc who said he could change the address (finally).  I have faxed the signature pages and the rx to them and requested they expedite.  Hopefully, we'll hear from them soon and I will let you know.  I am also going to contact patient to make her aware we have been working on this so she doesn't feel forgotten.  If you have any questions, please let me know.  Thank you!

## 2015-09-18 NOTE — Telephone Encounter (Signed)
Alexandria Leblanc is finally set up w/the [employer]'s patient assistance program and should receive her first Memory Dance w/in 10 business days.  As long as she meets eligibility requirements she is eligible for the program through 09/16/2016.  I have notified her and advised her that the med should be delivered w/in 10 days and gave her the # to call if it doesn't rec w/in in that time and also when it's time to order her refill.  I am sending a copy of the approval letter to be scanned into her chart.  So glad they could help her!    Thank you!

## 2015-09-25 ENCOUNTER — Telehealth: Payer: Self-pay | Admitting: Primary Care

## 2015-09-25 NOTE — Telephone Encounter (Signed)
-----   Message from Pleas Koch, NP sent at 09/18/2015  7:15 AM EDT ----- Regarding: Memory Dance Will you please check on Ms. Barge to see if she's received her Breo Inhaler yet?

## 2015-09-25 NOTE — Telephone Encounter (Signed)
Glad to hear it! Thanks also to [facility] who made this possible!

## 2015-09-25 NOTE — Telephone Encounter (Signed)
Spoken to patient. She stated that she just received her Breo inhaler yesterday. She was able to get 3 months supply of Breo inhaler and albuterol inhaler. She stated she used the [facility] and noticed she breathing better. She also wanted Anda [employer] to know, she appreciated it.

## 2015-11-18 ENCOUNTER — Telehealth: Payer: Self-pay | Admitting: Primary Care

## 2015-11-18 NOTE — Telephone Encounter (Signed)
Noted  

## 2015-11-18 NOTE — Telephone Encounter (Signed)
[facility]  Patient Name: Alexandria Leblanc  DOB: [birth year]    Initial Comment Caller states c/o sinus congestion and drainage, ear pain and neck stiffness.    Nurse Assessment  Nurse: Harlow Mares, RN, Suanne Marker Date/Time (Eastern Time): 11/18/2015 2:06:15 PM  Confirm and document reason for call. If symptomatic, describe symptoms. You must click the next button to save text entered. ---Caller states c/o sinus congestion and drainage, ear pain and neck stiffness. The main concern the ear pain and neck stiffness. Reports symptoms began last Thursday but the neck and ear pain began on Monday. The left ear is involved. Denies having fever. Reports a steady headache. Has high BP and recently d/c one of her bp meds, bp is stable. Taking OTC sinus medication. Able to place her chin on her chest, but this hurts her ear.  Has the patient traveled out of the country within the last 30 days? ---No  Does the patient have any new or worsening symptoms? ---Yes  Will a triage be completed? ---Yes  Related visit to physician within the last 2 weeks? ---No  Does the PT have any chronic conditions? (i.e. diabetes, asthma, etc.) ---Yes  List chronic conditions. ---HTN, high chol, diabetes, herpes,  Is the patient pregnant or possibly pregnant? (Ask all females between the ages of 36-55) ---No  Is this a behavioral health or substance abuse call? ---No     Guidelines    Guideline Title Affirmed Question Affirmed Notes  Earache Diabetes mellitus or weak immune system (e.g., HIV positive, cancer chemo, splenectomy, organ transplant, chronic steroids)    Final Disposition User   See Physician within 4 Hours (or PCP triage) Harlow Mares, RN, Rhonda    Comments  Caller reports that she is unable to be seen today due to no $, she doesn't get paid until next week. Nurse called [facility] office and spoke with Vaughan Basta who reports that they can defer payment of visit to next week when  caller gets paid. No appts available today, so appt made for tomorrow morning at 9:30am at the [facility] location with Alma Friendly, NP. Caller voiced understanding.   Referrals  REFERRED TO PCP OFFICE   Disagree/Comply: Disagree  Disagree/Comply Reason: Disagree with instructions

## 2015-11-18 NOTE — Telephone Encounter (Signed)
TH scheduled appt 11/19/15 at 9:30 with Allie Bossier NP.

## 2015-11-19 ENCOUNTER — Encounter: Payer: Self-pay | Admitting: Primary Care

## 2015-11-19 ENCOUNTER — Ambulatory Visit (INDEPENDENT_AMBULATORY_CARE_PROVIDER_SITE_OTHER): Payer: Self-pay | Admitting: Primary Care

## 2015-11-19 VITALS — BP 160/86 | HR 66 | Temp 98.8°F | Wt 202.8 lb

## 2015-11-19 DIAGNOSIS — J019 Acute sinusitis, unspecified: Secondary | ICD-10-CM

## 2015-11-19 DIAGNOSIS — I10 Essential (primary) hypertension: Secondary | ICD-10-CM

## 2015-11-19 MED ORDER — DOXYCYCLINE HYCLATE 100 MG PO TABS: 100.0000 mg | ORAL_TABLET | Freq: Two times a day (BID) | ORAL | Status: DC

## 2015-11-19 MED ORDER — LOSARTAN POTASSIUM 100 MG PO TABS: 100.0000 mg | ORAL_TABLET | Freq: Every day | ORAL | Status: DC

## 2015-11-19 NOTE — Progress Notes (Signed)
Pre visit review using our clinic review tool, if applicable. No additional management support is needed unless otherwise documented below in the visit note. 

## 2015-11-19 NOTE — Progress Notes (Signed)
Subjective:    Patient ID: Alexandria Leblanc, female    DOB: 1970/07/09, 45 y.o.   MRN: [id number]  HPI  Ms. Cecilio is a 45 year old female who presents today with a chief complaint of ear pain and neck stiffness. Symptoms initially began Thursday last week with sinus pressure, congestion, and scratchy throat. She has a daily cough due to tobacco abuse, but cough became worse last Friday. Monday this week she noticed stiffness to her neck and right ear pain. She's taken sinus tablets, and Goody powder, and has been applying warm compresses to her neck with temporary improvement. She was around her family member who was ill with the same symptoms. She is blowing clear mucus from her nasal cavity. Denies fevers. Overall she's starting to feel worse.  Review of Systems  Constitutional: Negative for fever and chills.  HENT: Positive for congestion, ear pain and sinus pressure. Negative for sore throat.   Respiratory: Positive for cough.   Musculoskeletal: Positive for myalgias.       Past Medical History  Diagnosis Date  . Hypertension   . GERD (gastroesophageal reflux disease)   . Type 2 diabetes mellitus ([place])   . Asthma   . Genital warts   . Migraines      Social History   Social History  . Marital Status: Unknown    Spouse Name: N/A  . Number of Children: N/A  . Years of Education: N/A   Occupational History  . Not on file.   Social History Main Topics  . Smoking status: Current Every Day Smoker -- 1.00 packs/day    Types: Cigarettes  . Smokeless tobacco: Not on file  . Alcohol Use: 0.0 oz/week    0 Standard drinks or equivalent per week     Comment: occ  . Drug Use: No  . Sexual Activity: Not on file   Other Topics Concern  . Not on file   Social History Narrative   Single.   No children.   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           No past surgical history on file.  Family History  Problem Relation Age of  Onset  . Heart disease Mother   . COPD Father   . Hypertension Mother   . Hypertension Father   . Hyperlipidemia Brother   . Prostate cancer Paternal Uncle     Allergies  Allergen Reactions  . Penicillins Hives and Itching  . Sulfa Antibiotics     Current Outpatient Prescriptions on File Prior to Visit  Medication Sig Dispense Refill  . acyclovir (ZOVIRAX) 400 MG tablet Take 1 tablet (400 mg total) by mouth 2 (two) times daily. 60 tablet 5  . albuterol (PROVENTIL HFA;VENTOLIN HFA) 108 (90 Base) MCG/ACT inhaler Inhale 2 puffs into the lungs every 6 (six) hours as needed for wheezing or shortness of breath. 1 Inhaler 11  . fluticasone (FLONASE) 50 MCG/ACT nasal spray Place 2 sprays into both nostrils daily.    . fluticasone furoate-vilanterol (BREO ELLIPTA) 100-25 MCG/INH AEPB Inhale 1 puff into the lungs daily. 1 each 5  . metFORMIN (GLUCOPHAGE) 500 MG tablet Take 1 tablet (500 mg total) by mouth 2 (two) times daily with a meal. 60 tablet 11  . omeprazole (PRILOSEC) 20 MG capsule Take 1 capsule (20 mg total) by mouth daily. 30 capsule 5   No current facility-administered medications on file prior to visit.  BP 160/86 mmHg  Pulse 66  Temp(Src) 98.8 F (37.1 C) (Oral)  Wt 202 lb 12 oz (91.967 kg)  SpO2 97%  LMP 11/14/2015    Objective:   Physical Exam  Constitutional: She appears well-nourished.  HENT:  Right Ear: Tympanic membrane and ear canal normal.  Left Ear: Tympanic membrane and ear canal normal.  Nose: Right sinus exhibits maxillary sinus tenderness and frontal sinus tenderness. Left sinus exhibits maxillary sinus tenderness and frontal sinus tenderness.  Mouth/Throat: Oropharynx is clear and moist.  Eyes: Conjunctivae are normal.  Neck: Neck supple.  Cardiovascular: Normal rate and regular rhythm.   Pulmonary/Chest: Effort normal and breath sounds normal. She has no wheezes. She has no rales.  Lymphadenopathy:    She has no cervical adenopathy.  Skin: Skin  is warm and dry.          Assessment & Plan:  Acute sinusitis:  Sinus pressure, cough, fatigue 6 days. Sick contact with family member. Temporary improvement with OTC treatment. Starting to feel worse now. Exam with clear lungs, mild to moderate tenderness over frontal sinuses. Does appear tired, not toxic. Could be viral or beginning of bacterial infection. We'll print off prescription for Z-Pak and have her fill Friday if no improvement or if symptoms progress. Discussed overuse of unnecessary antibiotics and importance of refraining from antibiotic use if symptoms improve.

## 2015-11-19 NOTE — Patient Instructions (Signed)
Do not fill the antibiotic prescription until Friday this week, only if your symptoms progress.  Nasal Congestion/Sinus Pressure: Try using Flonase (fluticasone) nasal spray. Instill 2 sprays in each nostril once daily.   Throat drainage, ear pressure: Claritin or Zyrtec. These may be purchased over the counter.  Ensure your are staying hydrated with water.  Monitor your blood pressure and notify me if you get readings at or above 140/90.   It was a pleasure to see you today!

## 2015-11-28 ENCOUNTER — Encounter: Payer: Self-pay | Admitting: Primary Care

## 2016-01-14 ENCOUNTER — Other Ambulatory Visit: Payer: Self-pay

## 2016-01-21 ENCOUNTER — Other Ambulatory Visit (HOSPITAL_COMMUNITY)
Admission: RE | Admit: 2016-01-21 | Discharge: 2016-01-21 | Disposition: A | Discharge status: Home / Self Care | Payer: Self-pay | Source: Ambulatory Visit | Attending: Primary Care | Admitting: Primary Care

## 2016-01-21 ENCOUNTER — Other Ambulatory Visit (HOSPITAL_COMMUNITY)
Admission: RE | Admit: 2016-01-21 | Discharge: 2016-01-21 | Disposition: A | Discharge status: Home / Self Care | Payer: Self-pay | Source: Ambulatory Visit | Attending: Internal Medicine | Admitting: Internal Medicine

## 2016-01-21 ENCOUNTER — Encounter: Payer: Self-pay | Admitting: Primary Care

## 2016-01-21 ENCOUNTER — Ambulatory Visit (INDEPENDENT_AMBULATORY_CARE_PROVIDER_SITE_OTHER): Payer: Self-pay | Admitting: Primary Care

## 2016-01-21 VITALS — BP 138/76 | HR 72 | Temp 98.4°F | Ht 63.0 in | Wt 201.8 lb

## 2016-01-21 DIAGNOSIS — Z01411 Encounter for gynecological examination (general) (routine) with abnormal findings: Secondary | ICD-10-CM | POA: Insufficient documentation

## 2016-01-21 DIAGNOSIS — R87810 Cervical high risk human papillomavirus (HPV) DNA test positive: Secondary | ICD-10-CM | POA: Insufficient documentation

## 2016-01-21 DIAGNOSIS — J4540 Moderate persistent asthma, uncomplicated: Secondary | ICD-10-CM

## 2016-01-21 DIAGNOSIS — E785 Hyperlipidemia, unspecified: Secondary | ICD-10-CM

## 2016-01-21 DIAGNOSIS — Z124 Encounter for screening for malignant neoplasm of cervix: Secondary | ICD-10-CM

## 2016-01-21 DIAGNOSIS — I10 Essential (primary) hypertension: Secondary | ICD-10-CM

## 2016-01-21 DIAGNOSIS — E119 Type 2 diabetes mellitus without complications: Secondary | ICD-10-CM

## 2016-01-21 DIAGNOSIS — Z1151 Encounter for screening for human papillomavirus (HPV): Secondary | ICD-10-CM | POA: Insufficient documentation

## 2016-01-21 DIAGNOSIS — Z1211 Encounter for screening for malignant neoplasm of colon: Secondary | ICD-10-CM | POA: Insufficient documentation

## 2016-01-21 DIAGNOSIS — Z0000 Encounter for general adult medical examination without abnormal findings: Secondary | ICD-10-CM

## 2016-01-21 LAB — COMPREHENSIVE METABOLIC PANEL
ALBUMIN: 4.4 g/dL (ref 3.5–5.2)
ALT: 25 U/L (ref 0–35)
AST: 24 U/L (ref 0–37)
Alkaline Phosphatase: 99 U/L (ref 39–117)
BUN: 11 mg/dL (ref 6–23)
CALCIUM: 9 mg/dL (ref 8.4–10.5)
CHLORIDE: 101 meq/L (ref 96–112)
CO2: 27 mEq/L (ref 19–32)
CREATININE: 0.58 mg/dL (ref 0.40–1.20)
GFR: 119.22 mL/min (ref >60.00)
Glucose, Bld: 99 mg/dL (ref 70–99)
POTASSIUM: 4 meq/L (ref 3.5–5.1)
Sodium: 135 mEq/L (ref 135–145)
Total Bilirubin: 0.2 mg/dL (ref 0.2–1.2)
Total Protein: 8.1 g/dL (ref 6.0–8.3)

## 2016-01-21 LAB — MICROALBUMIN / CREATININE URINE RATIO
CREATININE, U: 18.5 mg/dL
MICROALB UR: 1.4 mg/dL (ref 0.0–1.9)
MICROALB/CREAT RATIO: 7.6 mg/g (ref 0.0–30.0)

## 2016-01-21 LAB — LIPID PANEL
CHOLESTEROL: 235 mg/dL — AB (ref 0–200)
HDL: 40.7 mg/dL (ref >39.00)
Total CHOL/HDL Ratio: 6

## 2016-01-21 LAB — LDL CHOLESTEROL, DIRECT: Direct LDL: 128 mg/dL

## 2016-01-21 LAB — HEMOGLOBIN A1C: HEMOGLOBIN A1C: 6.6 % — AB (ref 4.6–6.5)

## 2016-01-21 NOTE — Assessment & Plan Note (Signed)
A1c pending today. Continue metformin 500 mg twice a day. Managed on ARB. May require statin treatment, lipid panel pending. Declines pneumonia vaccination. Urine microalbumin pending.

## 2016-01-21 NOTE — Patient Instructions (Addendum)
Complete lab work prior to leaving today. I will notify you of your results once received.   We will receive your pap results in the next 3-4 days.  It is important that you improve your diet. Please limit carbohydrates in the form of white bread, rice, pasta, cakes, cookies, sugary drinks, etc. Increase your consumption of fresh fruits and vegetables, whole grains, lean protein.  Ensure you are consuming 64 ounces of water daily.  Start exercising. You should be getting 1 hour of moderate intensity exercise 3-5 days weekly.  I recommend a pneumonia vaccination. Please think about this.  Please schedule a 30 minute appointment for removal of the mass on your vagina.  Follow up in 6 months for re-evaluation.  It was a pleasure to see you today!  Diabetes Mellitus and Food It is important for you to manage your blood sugar (glucose) level. Your blood glucose level can be greatly affected by what you eat. Eating healthier foods in the appropriate amounts throughout the day at about the same time each day will help you control your blood glucose level. It can also help slow or prevent worsening of your diabetes mellitus. Healthy eating may even help you improve the level of your blood pressure and reach or maintain a healthy weight.  General recommendations for healthful eating and cooking habits include:  Eating meals and snacks regularly. Avoid going long periods of time without eating to lose weight.  Eating a diet that consists mainly of plant-based foods, such as fruits, vegetables, nuts, legumes, and whole grains.  Using low-heat cooking methods, such as baking, instead of high-heat cooking methods, such as deep frying. Work with your dietitian to make sure you understand how to use the Nutrition Facts information on food labels. HOW CAN FOOD AFFECT ME? Carbohydrates Carbohydrates affect your blood glucose level more than any other type of food. Your dietitian will help you determine  how many carbohydrates to eat at each meal and teach you how to count carbohydrates. Counting carbohydrates is important to keep your blood glucose at a healthy level, especially if you are using insulin or taking certain medicines for diabetes mellitus. Alcohol Alcohol can cause sudden decreases in blood glucose (hypoglycemia), especially if you use insulin or take certain medicines for diabetes mellitus. Hypoglycemia can be a life-threatening condition. Symptoms of hypoglycemia (sleepiness, dizziness, and disorientation) are similar to symptoms of having too much alcohol.  If your health care provider has given you approval to drink alcohol, do so in moderation and use the following guidelines:  Women should not have more than one drink per day, and men should not have more than two drinks per day. One drink is equal to:  12 oz of beer.  5 oz of wine.  1 oz of hard liquor.  Do not drink on an empty stomach.  Keep yourself hydrated. Have water, diet soda, or unsweetened iced tea.  Regular soda, juice, and other mixers might contain a lot of carbohydrates and should be counted. WHAT FOODS ARE NOT RECOMMENDED? As you make food choices, it is important to remember that all foods are not the same. Some foods have fewer nutrients per serving than other foods, even though they might have the same number of calories or carbohydrates. It is difficult to get your body what it needs when you eat foods with fewer nutrients. Examples of foods that you should avoid that are high in calories and carbohydrates but low in nutrients include:  Trans fats (most processed  foods list trans fats on the Nutrition Facts label).  Regular soda.  Juice.  Candy.  Sweets, such as cake, pie, doughnuts, and cookies.  Fried foods. WHAT FOODS CAN I EAT? Eat nutrient-rich foods, which will nourish your body and keep you healthy. The food you should eat also will depend on several factors, including:  The calories  you need.  The medicines you take.  Your weight.  Your blood glucose level.  Your blood pressure level.  Your cholesterol level. You should eat a variety of foods, including:  Protein.  Lean cuts of meat.  Proteins low in saturated fats, such as fish, egg whites, and beans. Avoid processed meats.  Fruits and vegetables.  Fruits and vegetables that may help control blood glucose levels, such as apples, mangoes, and yams.  Dairy products.  Choose fat-free or low-fat dairy products, such as milk, yogurt, and cheese.  Grains, bread, pasta, and rice.  Choose whole grain products, such as multigrain bread, whole oats, and brown rice. These foods may help control blood pressure.  Fats.  Foods containing healthful fats, such as nuts, avocado, olive oil, canola oil, and fish. DOES EVERYONE WITH DIABETES MELLITUS HAVE THE SAME MEAL PLAN? Because every person with diabetes mellitus is different, there is not one meal plan that works for everyone. It is very important that you meet with a dietitian who will help you create a meal plan that is just right for you.   This information is not intended to replace advice given to you by your health care provider. Make sure you discuss any questions you have with your health care provider.   Document Released: 02/11/2005 Document Revised: 06/07/2014 Document Reviewed: 04/13/2013 Elsevier Interactive Patient Education [employer].

## 2016-01-21 NOTE — Progress Notes (Signed)
Pre visit review using our clinic review tool, if applicable. No additional management support is needed unless otherwise documented below in the visit note. 

## 2016-01-21 NOTE — Assessment & Plan Note (Signed)
Much improved with Breo inhaler.

## 2016-01-21 NOTE — Assessment & Plan Note (Signed)
Stable on losartan 50 mg and HCTZ 12.5 mg.

## 2016-01-21 NOTE — Assessment & Plan Note (Signed)
Tetanus up-to-date, due in 2018. Declines pneumonia vaccination despite recommendations. Declines mammogram despite recommendations. Pap due and completed today, pending. Discussed the importance of a healthy diet and regular exercise in order for weight loss, especially given comorbidities of diabetes and hypertension. Exam today stable. Area of concern to vaginal labia representative of benign nevus. She will schedule a follow-up appointment for removal. Labs pending.  Follow-up in 6 months for reevaluation of chronic conditions, follow-up in one year for repeat physical examination.

## 2016-01-21 NOTE — Progress Notes (Signed)
Subjective:    Patient ID: Alexandria Leblanc, female    DOB: 01/19/1971, 45 y.o.   MRN: [id number]  HPI  Alexandria Leblanc is a 45 year old female who presents today for complete physical.  Immunizations: -Tetanus: Completed in 2008 -Influenza: Did not complete last season -Pneumonia: Declines   Diet: She endorses a fair diet. Breakfast: Left overs, eggs, sausage, bacon Lunch: Sandwich, frozen pizza Dinner: Some fried foods, chicken, pork chops, salads, vegetables Snacks: Chips, fruit Desserts: Occasionally Beverages: Water, diet soda, coffee, unsweet tea  Exercise: She does not currently exercise Eye exam: Has never completed, Declines. Dental exam: Completed in 2017 Pap Smear: Unsure, due today. Mammogram: Completed in 2015, Declines repeat mammogram.   Review of Systems  Constitutional: Negative for unexpected weight change.  HENT: Negative for rhinorrhea.   Respiratory: Negative for cough and shortness of breath.   Cardiovascular: Negative for chest pain.  Gastrointestinal: Negative for constipation and diarrhea.       Occasional constipation  Genitourinary: Negative for difficulty urinating and menstrual problem.       Vaginal skin growth 20 years.  Musculoskeletal: Positive for arthralgias. Negative for myalgias.  Skin: Negative for rash.  Allergic/Immunologic: Positive for environmental allergies.  Neurological: Positive for headaches. Negative for dizziness and numbness.  Psychiatric/Behavioral:       Does have concerns for anxiety or depression, overall manages well.        Past Medical History:  Diagnosis Date  . Asthma   . Genital warts   . GERD (gastroesophageal reflux disease)   . Hypertension   . Migraines   . Type 2 diabetes mellitus ([place])      Social History   Social History  . Marital status: Unknown    Spouse name: N/A  . Number of children: N/A  . Years of education: N/A   Occupational History  . Not on file.   Social History Main  Topics  . Smoking status: Current Every Day Smoker    Packs/day: 1.00    Types: Cigarettes  . Smokeless tobacco: Not on file  . Alcohol use 0.0 oz/week     Comment: occ  . Drug use: No  . Sexual activity: Not on file   Other Topics Concern  . Not on file   Social History Narrative   Single.   No children.   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           No past surgical history on file.  Family History  Problem Relation Age of Onset  . Heart disease Mother   . COPD Father   . Hypertension Mother   . Hypertension Father   . Hyperlipidemia Brother   . Prostate cancer Paternal Uncle     Allergies  Allergen Reactions  . Penicillins Hives and Itching  . Sulfa Antibiotics     Current Outpatient Prescriptions on File Prior to Visit  Medication Sig Dispense Refill  . acyclovir (ZOVIRAX) 400 MG tablet Take 1 tablet (400 mg total) by mouth 2 (two) times daily. 60 tablet 5  . albuterol (PROVENTIL HFA;VENTOLIN HFA) 108 (90 Base) MCG/ACT inhaler Inhale 2 puffs into the lungs every 6 (six) hours as needed for wheezing or shortness of breath. 1 Inhaler 11  . fluticasone (FLONASE) 50 MCG/ACT nasal spray Place 2 sprays into both nostrils daily.    . fluticasone furoate-vilanterol (BREO ELLIPTA) 100-25 MCG/INH AEPB Inhale 1 puff into the lungs daily. 1  each 5  . losartan (COZAAR) 100 MG tablet Take 1 tablet (100 mg total) by mouth daily. (Patient taking differently: Take 50 mg by mouth daily. ) 90 tablet 3  . metFORMIN (GLUCOPHAGE) 500 MG tablet Take 1 tablet (500 mg total) by mouth 2 (two) times daily with a meal. 60 tablet 11  . omeprazole (PRILOSEC) 20 MG capsule Take 1 capsule (20 mg total) by mouth daily. 30 capsule 5   No current facility-administered medications on file prior to visit.     BP 138/76   Pulse 72   Temp 98.4 F (36.9 C) (Oral)   Ht 5\' 3"  (1.6 m)   Wt 201 lb 12.8 oz (91.5 kg)   LMP  (LMP Unknown)   SpO2 95%   BMI  35.75 kg/m    Objective:   Physical Exam  Constitutional: She is oriented to person, place, and time. She appears well-nourished.  HENT:  Right Ear: Tympanic membrane and ear canal normal.  Left Ear: Tympanic membrane and ear canal normal.  Nose: Nose normal.  Mouth/Throat: Oropharynx is clear and moist.  Eyes: Conjunctivae and EOM are normal. Pupils are equal, round, and reactive to light.  Neck: Neck supple. No thyromegaly present.  Cardiovascular: Normal rate and regular rhythm.   No murmur heard. Pulmonary/Chest: Effort normal and breath sounds normal. She has no rales.  Mild wheezing to upper lobes, much improved when compared to last exam.  Abdominal: Soft. Bowel sounds are normal. There is no tenderness.  Genitourinary: There is no rash on the right labia. There is no rash on the left labia. Cervix exhibits no motion tenderness and no discharge. Right adnexum displays no tenderness. Left adnexum displays no tenderness. No vaginal discharge found.  Genitourinary Comments: 1 cm circular raised mass to right labia majora representative of nevus. Non-tender, nonerythematous.  Musculoskeletal: Normal range of motion.  Lymphadenopathy:    She has no cervical adenopathy.  Neurological: She is alert and oriented to person, place, and time. She has normal reflexes. No cranial nerve deficit.  Skin: Skin is warm and dry. No rash noted.  Psychiatric: She has a normal mood and affect.          Assessment & Plan:

## 2016-01-22 LAB — CYTOLOGY - PAP

## 2016-01-23 ENCOUNTER — Telehealth: Payer: Self-pay | Admitting: Primary Care

## 2016-01-23 ENCOUNTER — Other Ambulatory Visit: Payer: Self-pay | Admitting: Primary Care

## 2016-01-23 DIAGNOSIS — E785 Hyperlipidemia, unspecified: Secondary | ICD-10-CM

## 2016-01-23 MED ORDER — LOVASTATIN 20 MG PO TABS: 20.0000 mg | ORAL_TABLET | Freq: Every day | ORAL | 1 refills | Status: DC

## 2016-01-23 NOTE — Telephone Encounter (Signed)
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 I can remove this by freezing it off in the office. Please schedule this at her convenience.

## 2016-01-23 NOTE — Telephone Encounter (Signed)
Spoken and notified patient of Kate's comments. Patient verbalized understanding.  Patient will call back later for an office appointment.

## 2016-02-04 ENCOUNTER — Encounter: Payer: Self-pay | Admitting: Primary Care

## 2016-02-04 ENCOUNTER — Other Ambulatory Visit: Payer: Self-pay | Admitting: Primary Care

## 2016-02-04 DIAGNOSIS — J4540 Moderate persistent asthma, uncomplicated: Secondary | ICD-10-CM

## 2016-02-04 MED ORDER — FLUTICASONE FUROATE-VILANTEROL 100-25 MCG/INH IN AEPB: 1.0000 | INHALATION_SPRAY | Freq: Every day | RESPIRATORY_TRACT | 5 refills | Status: DC

## 2016-02-04 MED ORDER — METFORMIN HCL 500 MG PO TABS: 500.0000 mg | ORAL_TABLET | Freq: Two times a day (BID) | ORAL | 5 refills | Status: DC

## 2016-02-04 NOTE — Telephone Encounter (Signed)
Pt left v/m; pt is requesting free shipment of Breo from [place]. Pt request cb [phone].

## 2016-02-04 NOTE — Telephone Encounter (Signed)
Received faxed refill request for metformin 500 mg tablet.  Will refill as request per Kate's last assessment on patient's lab on 01/21/2016.

## 2016-02-18 ENCOUNTER — Telehealth: Payer: Self-pay

## 2016-02-18 NOTE — Telephone Encounter (Signed)
Alexandria Leblanc, please apologize for her symptoms but I just cannot send in antibiotics without formal evaluation. Rose, is there anything you can do to help this patient out financially? Does she qualify for any financial help?

## 2016-02-18 NOTE — Telephone Encounter (Signed)
Patient calls to report ongoing respiratory symptoms X 3 weeks and is concerned that she now has bronchitis.    Sx's:  Head and chest congestion, non productive cough w/ occasional clear to green sputum, SOB, wheeze.    Has been using OTC: mucinex, Alka Seltzer cold, Robitussin DM combinations to try and treat, sx's not improving.  She is afebrile and denies any sore throat or ear pain, just reports her breathing is the worst sx she is dealing with. Is using her albuterol frequently and the Breo q am.  I discussed with her needing to be seen at this point and tried to get her in tomorrow.  She refuses stating that money is a big issue for her and cannot afford to come in right now.  Actually, had an appointment this week but she called and cancelled due to fund issues.  Patient is hoping that NP will call in R/X for her to [place] until she can afford to come in.  Has an appointment scheduled for 03/05/16 currently.  Please Advise.

## 2016-02-18 NOTE — Telephone Encounter (Signed)
Spoken and notified patient of Kate's comments. Patient verbalized understanding. 

## 2016-02-20 ENCOUNTER — Ambulatory Visit: Payer: Self-pay | Admitting: Primary Care

## 2016-02-23 NOTE — Telephone Encounter (Signed)
Rose, do you mind explaining this to her? If you cannot find the time then please forward to [place].

## 2016-02-23 NOTE — Telephone Encounter (Signed)
Thank you, Rose. I appreciate all your help with this patient, I know she is grateful for your hard work.

## 2016-02-23 NOTE — Telephone Encounter (Signed)
°  I am just now seeing this since I was not in the office last Thursday or Friday and I do apologize.    Ms. Alexandria Leblanc gets a 55% discount off of whatever is normally billed to insurance since she is self pay.  If she still cannot afford medical care she needs to contact [facility] [employer] at [phone] and request a financial aid.  They will ask a few questions to see if she qualifies.  Depending on her financial situation they may be able to further discount her bill, possibly even up to 100% if she qualifies.  I hope this helps, but if not let me know and I'll dig further for her.  Thank you!

## 2016-02-23 NOTE — Telephone Encounter (Signed)
I contacted Alexandria Leblanc and gave her the info I sent to you.  She is going to contact [facility] to see if she qualifies for financial aid.  Hopefully, this will her her tremendously, but if not, let me know and we'll check for further avenues.  Thank you!

## 2016-03-05 ENCOUNTER — Other Ambulatory Visit: Payer: Self-pay | Admitting: Primary Care

## 2016-03-05 ENCOUNTER — Telehealth: Payer: Self-pay | Admitting: Primary Care

## 2016-03-05 ENCOUNTER — Encounter: Payer: Self-pay | Admitting: Primary Care

## 2016-03-05 ENCOUNTER — Ambulatory Visit (INDEPENDENT_AMBULATORY_CARE_PROVIDER_SITE_OTHER): Payer: Self-pay | Admitting: Primary Care

## 2016-03-05 VITALS — BP 138/86 | HR 97 | Temp 98.2°F | Ht 63.0 in | Wt 200.1 lb

## 2016-03-05 DIAGNOSIS — M5441 Lumbago with sciatica, right side: Secondary | ICD-10-CM

## 2016-03-05 DIAGNOSIS — Z23 Encounter for immunization: Secondary | ICD-10-CM

## 2016-03-05 DIAGNOSIS — M545 Low back pain, unspecified: Secondary | ICD-10-CM | POA: Insufficient documentation

## 2016-03-05 DIAGNOSIS — M5442 Lumbago with sciatica, left side: Secondary | ICD-10-CM

## 2016-03-05 DIAGNOSIS — J4540 Moderate persistent asthma, uncomplicated: Secondary | ICD-10-CM

## 2016-03-05 DIAGNOSIS — G8929 Other chronic pain: Secondary | ICD-10-CM | POA: Insufficient documentation

## 2016-03-05 DIAGNOSIS — A6000 Herpesviral infection of urogenital system, unspecified: Secondary | ICD-10-CM

## 2016-03-05 MED ORDER — ALBUTEROL SULFATE HFA 108 (90 BASE) MCG/ACT IN AERS: 2.0000 | INHALATION_SPRAY | Freq: Four times a day (QID) | RESPIRATORY_TRACT | 11 refills | Status: DC | PRN

## 2016-03-05 MED ORDER — PREDNISONE 10 MG PO TABS: ORAL_TABLET | ORAL | 0 refills | Status: DC

## 2016-03-05 MED ORDER — FLUTICASONE FUROATE-VILANTEROL 100-25 MCG/INH IN AEPB: 1.0000 | INHALATION_SPRAY | Freq: Every day | RESPIRATORY_TRACT | 11 refills | Status: DC

## 2016-03-05 NOTE — Assessment & Plan Note (Signed)
Used cryotherapy to treat vaginal lesion. Patient tolerated well.

## 2016-03-05 NOTE — Patient Instructions (Addendum)
Start Prednisone tablets for sciatica. Take 4 tablets for 2 days, then 3 tablets for 2 days, then 2 tablets for 2 days, then 1 tablet for 2 days.  Do not take ibuprofen when taking Prednisone.  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   It was a pleasure to see you today!   Genital Warts Genital warts are a common STD (sexually transmitted disease). They may appear as small bumps on the tissues of the genital area or anal area. Sometimes, they can become irritated and cause pain. Genital warts are easily passed to other people through sexual contact. Getting treatment is important because genital warts can lead to other problems. In females, the virus that causes genital warts may increase the risk of cervical cancer. CAUSES Genital warts are caused by a virus that is called human papillomavirus (HPV). HPV is spread by having unprotected sex with an infected person. It can be spread through vaginal, anal, and oral sex. Many people do not know that they are infected. They may be infected for years without problems. However, even if they do not have problems, they can pass the infection to their sexual partners. RISK FACTORS Genital warts are more likely to develop in:  People who have unprotected sex.  People who have multiple sexual partners.  People who become sexually active before they are 45 years of age.  Men who are not circumcised.  Women who have a female sexual partner who is not circumcised.  People who have a weakened body defense system (immune system) due to disease or medicine.  People who smoke. SYMPTOMS Symptoms of genital warts include:  Small growths in the genital area or anal area. These warts often grow in clusters.  Itching and irritation in the genital area or anal area.  Bleeding from the warts.  Painful sexual intercourse. DIAGNOSIS Genital warts can usually be diagnosed from their appearance on the vagina, vulva, penis, perineum,  anus, or rectum. Tests may also be done, such as:  Biopsy. A tissue sample is removed so it can be looked at under a microscope.  Colposcopy. In females, a magnifying tool is used to examine the vagina and cervix. Certain solutions may be used to make the HPV cells change color so they can be seen more easily.  A Pap test in females.  Tests for other STDs. TREATMENT Treatment for genital warts may include:  Applying prescription medicines to the warts. These may be solutions or creams.  Freezing the warts with liquid nitrogen (cryotherapy).  Burning the warts with:  Laser treatment.  An electrified probe (electrocautery).  Injecting a substance (Candida antigen or Trichophyton antigen) into the warts to help the body's immune system to fight off the warts.  Interferon injections.  Surgery to remove the warts. HOME CARE INSTRUCTIONS Medicines  Apply over-the-counter and prescription medicines only as told by your health care provider.  Do not treat genital warts with medicines that are used for treating hand warts.  Talk with your health care provider about using over-the-counter anti-itch creams. General Instructions  Do not touch or scratch the warts.  Do not have sex until your treatment has been completed.  Tell your current and past sexual partners about your condition because they may also need treatment.  Keep all follow-up visits as told by your health care provider. This is important.  After treatment, use condoms during sex to prevent future infections. Other Instructions for Women  Women who have genital warts might need  increased screening for cervical cancer. This type of cancer is slow growing and can be cured if it is found early. Chances of developing cervical cancer are increased with HPV.  If you become pregnant, tell your health care provider that you have had HPV. Your health care provider will monitor you closely during pregnancy to be sure that  your baby is safe. PREVENTION Talk with your health care provider about getting the HPV vaccines. These vaccines prevent some HPV infections and cancers. It is recommended that the vaccine be given to males and females who are 69-64 years of age. It will not work if you already have HPV, and it is not recommended for pregnant women. SEEK MEDICAL CARE IF:  You have redness, swelling, or pain in the area of the treated skin.  You have a fever.  You feel generally ill.  You feel lumps in and around your genital area or anal area.  You have bleeding in your genital area or anal area.  You have pain during sexual intercourse.   This information is not intended to replace advice given to you by your health care provider. Make sure you discuss any questions you have with your health care provider.   Document Released: 05/14/2000 Document Revised: 02/05/2015 Document Reviewed: 08/12/2014 Elsevier Interactive Patient Education [employer].

## 2016-03-05 NOTE — Progress Notes (Signed)
Pre visit review using our clinic review tool, if applicable. No additional management support is needed unless otherwise documented below in the visit note. 

## 2016-03-05 NOTE — Progress Notes (Signed)
Subjective:    Patient ID: Alexandria Leblanc, female    DOB: 08/05/1970, 45 y.o.   MRN: [id number]  HPI  [name] is a 45 year old female who presents today for removal of genital wart. She has a vaginal skin growth that has been present for the past 20 years and has increased in size gradually over the past 5 years. 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 Denies tenderness, drainage, no vaginal symptoms.   2) Nerve Pain/Back Pain: Sharp, burning, shock-like pain. Located within the buttocks that has been intermittently present for the past 8 years. Over the past 1 month she's noticed her pain getting worse. She's now developed radiation to bilateral lower extremities for the past 1 week. She's also noticed midline low back pain for the past 1 week. Denies injury/trauma, weakness, numbness/tinlging.   Review of Systems  Respiratory: Negative for shortness of breath.   Cardiovascular: Negative for chest pain.  Genitourinary: Positive for genital sores. Negative for frequency and vaginal discharge.  Musculoskeletal: Positive for back pain.  Neurological: Negative for weakness and numbness.       Past Medical History:  Diagnosis Date  . Asthma   . Genital warts   . GERD (gastroesophageal reflux disease)   . Hypertension   . Migraines   . Type 2 diabetes mellitus (Alexandria Leblanc)      Social History   Social History  . Marital status: Unknown    Spouse name: N/A  . Number of children: N/A  . Years of education: N/A   Occupational History  . Not on file.   Social History Main Topics  . Smoking status: Current Every Day Smoker    Packs/day: 1.00    Types: Cigarettes  . Smokeless tobacco: Not on file  . Alcohol use 0.0 oz/week     Comment: occ  . Drug use: No  . Sexual activity: Not on file   Other Topics Concern  . Not on file   Social History Narrative   Single.   No children.   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         No past surgical history on file.  Family History  Problem Relation Age of Onset  . Heart disease Mother   . COPD Father   . Hypertension Mother   . Hypertension Father   . Hyperlipidemia Brother   . Prostate cancer Paternal Uncle     Allergies  Allergen Reactions  . Penicillins Hives and Itching  . Sulfa Antibiotics     Current Outpatient Prescriptions on File Prior to Visit  Medication Sig Dispense Refill  . acyclovir (ZOVIRAX) 400 MG tablet Take 1 tablet (400 mg total) by mouth 2 (two) times daily. 60 tablet 5  . albuterol (PROVENTIL HFA;VENTOLIN HFA) 108 (90 Base) MCG/ACT inhaler Inhale 2 puffs into the lungs every 6 (six) hours as needed for wheezing or shortness of breath. 1 Inhaler 11  . fluticasone (FLONASE) 50 MCG/ACT nasal spray Place 2 sprays into both nostrils daily.    . fluticasone furoate-vilanterol (BREO ELLIPTA) 100-25 MCG/INH AEPB Inhale 1 puff into the lungs daily. 1 each 5  . hydrochlorothiazide (HYDRODIURIL) 25 MG tablet Take 12.5 mg by mouth daily.    Marland Kitchen losartan (COZAAR) 100 MG tablet Take 1 tablet (100 mg total) by mouth daily. (Patient taking differently: Take 50 mg by mouth daily. ) 90 tablet 3  . lovastatin (MEVACOR) 20 MG  tablet Take 1 tablet (20 mg total) by mouth at bedtime. 90 tablet 1  . metFORMIN (GLUCOPHAGE) 500 MG tablet Take 1 tablet (500 mg total) by mouth 2 (two) times daily with a meal. 60 tablet 5  . omeprazole (PRILOSEC) 20 MG capsule Take 1 capsule (20 mg total) by mouth daily. 30 capsule 5   No current facility-administered medications on file prior to visit.     BP 138/86   Pulse 97   Temp 98.2 F (36.8 C) (Oral)   Ht 5\' 3"  (1.6 m)   Wt 200 lb 1.9 oz (90.8 kg)   LMP 02/29/2016   SpO2 97%   BMI 35.45 kg/m    Objective:   Physical Exam  Constitutional: She appears well-nourished.  Cardiovascular: Normal rate and regular rhythm.   Pulmonary/Chest: Effort normal and breath sounds normal.  Genitourinary:    There is  lesion on the right labia. No tenderness in the vagina. No vaginal discharge found.  Genitourinary Comments: 1.5 cm circular lesion to right internal labia   Musculoskeletal:       Lumbar back: She exhibits decreased range of motion and pain. She exhibits no bony tenderness.  Skin: Skin is warm and dry.          Assessment & Plan:  Genital Lesion:  Located to right internal labia x 20 years, 1.5 cm in circumference, non tender. 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 Consent signed for Cryotherapy today. Witnessed by CMA. Prepared site with betadine soap. Sprayed site with Pain Ease. Applied Liquid Nitrogen x 3 rounds. Patient tolerated procedure well. Discussed home care instructions.  Sheral Flow, NP

## 2016-03-05 NOTE — Telephone Encounter (Signed)
I called [facility] to find out why Ms. Edds was not rec'ing her Breo.  They said they need a new prescription and you can fax it to [phone] Attn:  Tiffany.   Tiffany also said to be sure to include the pt's ID # [id number] on the Rx.  It looks like [facility] has the albuterol 0.83% inh soln  25x10ml or the 0.5%in sol 20 ml - 30 days for $10 or 90 days for $20  Hope this helps!  Let me know if you have further questions.  Thank you!

## 2016-03-05 NOTE — Assessment & Plan Note (Signed)
Chronic nerve buttocks pain x 8 years. Worse over the past month. Now with low back pain and radiculopathy to bilateral lower extremities. Suspect lower lumbar involvement. Patient declines xray given lack of insurance. Will treat with round of prednisone, heating pad, stretching. Discussed NSAID use post prednisone.

## 2016-03-05 NOTE — Telephone Encounter (Signed)
Rx printed and placed in Chan's inbox. Please notify patient this was completed.

## 2016-03-08 MED ORDER — ALBUTEROL SULFATE HFA 108 (90 BASE) MCG/ACT IN AERS: 2.0000 | INHALATION_SPRAY | Freq: Four times a day (QID) | RESPIRATORY_TRACT | 11 refills | Status: DC | PRN

## 2016-03-08 NOTE — Telephone Encounter (Signed)
Faxed Rx to [place] at [address] [facility]

## 2016-03-09 ENCOUNTER — Encounter: Payer: Self-pay | Admitting: Primary Care

## 2016-03-10 ENCOUNTER — Encounter: Payer: Self-pay | Admitting: Primary Care

## 2016-03-10 ENCOUNTER — Other Ambulatory Visit: Payer: Self-pay | Admitting: Primary Care

## 2016-03-10 DIAGNOSIS — M5441 Lumbago with sciatica, right side: Secondary | ICD-10-CM

## 2016-03-10 DIAGNOSIS — M5442 Lumbago with sciatica, left side: Secondary | ICD-10-CM

## 2016-03-11 ENCOUNTER — Encounter: Payer: Self-pay | Admitting: Primary Care

## 2016-03-11 ENCOUNTER — Ambulatory Visit (INDEPENDENT_AMBULATORY_CARE_PROVIDER_SITE_OTHER): Payer: Self-pay | Admitting: Primary Care

## 2016-03-11 VITALS — BP 144/82 | HR 79 | Temp 98.4°F | Ht 63.0 in | Wt 198.0 lb

## 2016-03-11 DIAGNOSIS — A630 Anogenital (venereal) warts: Secondary | ICD-10-CM

## 2016-03-11 MED ORDER — IMIQUIMOD 5 % EX CREA: TOPICAL_CREAM | CUTANEOUS | 0 refills | Status: DC

## 2016-03-11 NOTE — Patient Instructions (Addendum)
Apply imiquimod 5% cream three times weekly, on alternating days, until wart is gone. Leave cream on for 6-10 hours at a time. This is typically applied at bedtime.  Please e-mail me if no improvement in 4 weeks.  It was a pleasure to see you today!

## 2016-03-11 NOTE — Progress Notes (Signed)
Subjective:    Patient ID: Alexandria Leblanc, female    DOB: [birth year], 45 y.o.   MRN: [id number]  HPI  Ms. Blechman is a 45 year old female who presents today for follow up of wart destruction. She presented last week for cryotherapy of a genital wart that had been present for the past 20+ years.   Since her last visit the wart has been draining a pale pink and is tender. She believes the wart turned black for several days but is now turning back to her fleshy color. She believes the wart has reduced in size. Denies fevers, chills.   Review of Systems  Constitutional: Negative for chills and fever.  Skin: Positive for color change.       Genital wart       Past Medical History:  Diagnosis Date  . Asthma   . Genital warts   . GERD (gastroesophageal reflux disease)   . Hypertension   . Migraines   . Type 2 diabetes mellitus ([place])      Social History   Social History  . Marital status: Unknown    Spouse name: N/A  . Number of children: N/A  . Years of education: N/A   Occupational History  . Not on file.   Social History Main Topics  . Smoking status: Current Every Day Smoker    Packs/day: 1.00    Types: Cigarettes  . Smokeless tobacco: Not on file  . Alcohol use 0.0 oz/week     Comment: occ  . Drug use: No  . Sexual activity: Not on file   Other Topics Concern  . Not on file   Social History Narrative   Single.   No children.   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           No past surgical history on file.  Family History  Problem Relation Age of Onset  . Heart disease Mother   . COPD Father   . Hypertension Mother   . Hypertension Father   . Hyperlipidemia Brother   . Prostate cancer Paternal Uncle     Allergies  Allergen Reactions  . Penicillins Hives and Itching  . Sulfa Antibiotics     Current Outpatient Prescriptions on File Prior to Visit  Medication Sig Dispense Refill  . acyclovir (ZOVIRAX)  400 MG tablet Take 1 tablet (400 mg total) by mouth 2 (two) times daily. 60 tablet 5  . albuterol (PROVENTIL HFA;VENTOLIN HFA) 108 (90 Base) MCG/ACT inhaler Inhale 2 puffs into the lungs every 6 (six) hours as needed for wheezing or shortness of breath. 1 Inhaler 11  . fluticasone (FLONASE) 50 MCG/ACT nasal spray Place 2 sprays into both nostrils daily.    . fluticasone furoate-vilanterol (BREO ELLIPTA) 100-25 MCG/INH AEPB Inhale 1 puff into the lungs daily. 1 each 11  . hydrochlorothiazide (HYDRODIURIL) 25 MG tablet Take 12.5 mg by mouth daily.    Marland Kitchen losartan (COZAAR) 100 MG tablet Take 1 tablet (100 mg total) by mouth daily. (Patient taking differently: Take 50 mg by mouth daily. ) 90 tablet 3  . lovastatin (MEVACOR) 20 MG tablet Take 1 tablet (20 mg total) by mouth at bedtime. 90 tablet 1  . metFORMIN (GLUCOPHAGE) 500 MG tablet Take 1 tablet (500 mg total) by mouth 2 (two) times daily with a meal. 60 tablet 5  . omeprazole (PRILOSEC) 20 MG capsule Take 1 capsule (20 mg total) by mouth  daily. 30 capsule 5  . predniSONE (DELTASONE) 10 MG tablet Take 4 tablets for 2 days, then 3 tablets for 2 days, then 2 tablets for 2 days, then 1 tablet for 2 days. 20 tablet 0   No current facility-administered medications on file prior to visit.     BP (!) 144/82   Pulse 79   Temp 98.4 F (36.9 C) (Oral)   Ht 5\' 3"  (1.6 m)   Wt 198 lb (89.8 kg)   LMP 02/29/2016   SpO2 96%   BMI 35.07 kg/m    Objective:   Physical Exam  Constitutional: She appears well-nourished. She does not appear ill.  Neck: Neck supple.  Genitourinary:  Genitourinary Comments: Genital wart to right labia with reduction in size, tender. Does not appear acutely infected.  Skin: Skin is warm and dry.          Assessment & Plan:  Post Cryotherapy Destruction of Genital Wart:  Genital wart to right labia majora present for 20 years. Last visit cryotherapy was used for destruction. Today wart appears smaller and healing  well.  No s/s of infection.  Rx for Aldara cream provided for further destruction if needed. Return precautions provided.  Sheral Flow, NP

## 2016-03-11 NOTE — Progress Notes (Signed)
Pre visit review using our clinic review tool, if applicable. No additional management support is needed unless otherwise documented below in the visit note. 

## 2016-03-31 ENCOUNTER — Encounter: Payer: Self-pay | Admitting: Primary Care

## 2016-04-23 ENCOUNTER — Encounter: Payer: Self-pay | Admitting: Primary Care

## 2016-04-24 ENCOUNTER — Other Ambulatory Visit: Payer: Self-pay | Admitting: Primary Care

## 2016-04-24 DIAGNOSIS — E782 Mixed hyperlipidemia: Secondary | ICD-10-CM

## 2016-04-30 ENCOUNTER — Other Ambulatory Visit: Payer: Self-pay

## 2016-05-11 ENCOUNTER — Other Ambulatory Visit: Payer: Self-pay | Admitting: Primary Care

## 2016-05-11 DIAGNOSIS — E785 Hyperlipidemia, unspecified: Secondary | ICD-10-CM

## 2016-05-14 ENCOUNTER — Other Ambulatory Visit (INDEPENDENT_AMBULATORY_CARE_PROVIDER_SITE_OTHER): Payer: Self-pay

## 2016-05-14 ENCOUNTER — Other Ambulatory Visit: Payer: Self-pay | Admitting: *Deleted

## 2016-05-14 DIAGNOSIS — E782 Mixed hyperlipidemia: Secondary | ICD-10-CM

## 2016-05-14 LAB — LIPID PANEL
CHOLESTEROL: 191 mg/dL (ref 0–200)
HDL: 40.5 mg/dL (ref >39.00)
NONHDL: 150.08
TRIGLYCERIDES: 269 mg/dL — AB (ref 0.0–149.0)
Total CHOL/HDL Ratio: 5
VLDL: 53.8 mg/dL — ABNORMAL HIGH (ref 0.0–40.0)

## 2016-05-14 LAB — LDL CHOLESTEROL, DIRECT: Direct LDL: 97 mg/dL

## 2016-05-14 MED ORDER — FLUTICASONE PROPIONATE 50 MCG/ACT NA SUSP: 2.0000 | Freq: Every day | NASAL | 3 refills | Status: DC

## 2016-05-25 ENCOUNTER — Encounter: Payer: Self-pay | Admitting: Primary Care

## 2016-05-26 ENCOUNTER — Other Ambulatory Visit: Payer: Self-pay | Admitting: Primary Care

## 2016-05-26 ENCOUNTER — Encounter: Payer: Self-pay | Admitting: Primary Care

## 2016-05-26 DIAGNOSIS — A6000 Herpesviral infection of urogenital system, unspecified: Secondary | ICD-10-CM

## 2016-05-26 NOTE — Telephone Encounter (Signed)
Pt read your mychart note about it being ok to miss 1-2 doses but pt is very afraid to miss more than one dose and request acyclovir refilled ASAP. [facility].

## 2016-05-26 NOTE — Telephone Encounter (Signed)
Pt returned your call, requesting a cb °

## 2016-06-08 ENCOUNTER — Encounter: Payer: Self-pay | Admitting: Primary Care

## 2016-06-08 ENCOUNTER — Other Ambulatory Visit: Payer: Self-pay | Admitting: Primary Care

## 2016-06-08 DIAGNOSIS — K219 Gastro-esophageal reflux disease without esophagitis: Secondary | ICD-10-CM

## 2016-06-08 MED ORDER — OMEPRAZOLE 20 MG PO CPDR: 20.0000 mg | DELAYED_RELEASE_CAPSULE | Freq: Every day | ORAL | 1 refills | Status: DC

## 2016-07-26 ENCOUNTER — Encounter: Payer: Self-pay | Admitting: Primary Care

## 2016-07-27 NOTE — Telephone Encounter (Signed)
Hi Rose,  Can you assist Ms. Palmieri? Thanks! Allie Bossier, NP-C

## 2016-08-19 ENCOUNTER — Other Ambulatory Visit: Payer: Self-pay | Admitting: Primary Care

## 2016-08-19 DIAGNOSIS — E119 Type 2 diabetes mellitus without complications: Secondary | ICD-10-CM

## 2016-08-20 NOTE — Telephone Encounter (Signed)
Last lab 12/2015-abnormal.

## 2016-09-12 ENCOUNTER — Emergency Department (HOSPITAL_COMMUNITY)
Admission: EM | Admit: 2016-09-12 | Discharge: 2016-09-12 | Disposition: A | Discharge status: Home / Self Care | Payer: Self-pay | Attending: Emergency Medicine | Admitting: Emergency Medicine

## 2016-09-12 DIAGNOSIS — Z79899 Other long term (current) drug therapy: Secondary | ICD-10-CM | POA: Insufficient documentation

## 2016-09-12 DIAGNOSIS — I10 Essential (primary) hypertension: Secondary | ICD-10-CM | POA: Insufficient documentation

## 2016-09-12 DIAGNOSIS — F17210 Nicotine dependence, cigarettes, uncomplicated: Secondary | ICD-10-CM | POA: Insufficient documentation

## 2016-09-12 DIAGNOSIS — E119 Type 2 diabetes mellitus without complications: Secondary | ICD-10-CM | POA: Insufficient documentation

## 2016-09-12 DIAGNOSIS — J45909 Unspecified asthma, uncomplicated: Secondary | ICD-10-CM | POA: Insufficient documentation

## 2016-09-12 DIAGNOSIS — R197 Diarrhea, unspecified: Secondary | ICD-10-CM | POA: Insufficient documentation

## 2016-09-12 DIAGNOSIS — Z794 Long term (current) use of insulin: Secondary | ICD-10-CM | POA: Insufficient documentation

## 2016-09-12 DIAGNOSIS — R112 Nausea with vomiting, unspecified: Secondary | ICD-10-CM | POA: Insufficient documentation

## 2016-09-12 LAB — COMPREHENSIVE METABOLIC PANEL
ALT: 42 U/L (ref 14–54)
ANION GAP: 11 (ref 5–15)
AST: 36 U/L (ref 15–41)
Albumin: 4.5 g/dL (ref 3.5–5.0)
Alkaline Phosphatase: 118 U/L (ref 38–126)
BUN: 13 mg/dL (ref 6–20)
CO2: 22 mmol/L (ref 22–32)
Calcium: 9.4 mg/dL (ref 8.9–10.3)
Chloride: 106 mmol/L (ref 101–111)
Creatinine, Ser: 0.83 mg/dL (ref 0.44–1.00)
Glucose, Bld: 191 mg/dL — ABNORMAL HIGH (ref 65–99)
POTASSIUM: 4.2 mmol/L (ref 3.5–5.1)
Sodium: 139 mmol/L (ref 135–145)
TOTAL PROTEIN: 8.4 g/dL — AB (ref 6.5–8.1)
Total Bilirubin: 0.9 mg/dL (ref 0.3–1.2)

## 2016-09-12 LAB — CBC
HEMATOCRIT: 54.5 % — AB (ref 36.0–46.0)
HEMOGLOBIN: 18.9 g/dL — AB (ref 12.0–15.0)
MCH: 31.5 pg (ref 26.0–34.0)
MCHC: 34.7 g/dL (ref 30.0–36.0)
MCV: 90.8 fL (ref 78.0–100.0)
Platelets: 142 10*3/uL — ABNORMAL LOW (ref 150–400)
RBC: 6 MIL/uL — ABNORMAL HIGH (ref 3.87–5.11)
RDW: 13.3 % (ref 11.5–15.5)
WBC: 11.8 10*3/uL — AB (ref 4.0–10.5)

## 2016-09-12 LAB — LIPASE, BLOOD: Lipase: 33 U/L (ref 11–51)

## 2016-09-12 LAB — CBG MONITORING, ED: GLUCOSE-CAPILLARY: 201 mg/dL — AB (ref 65–99)

## 2016-09-12 LAB — I-STAT BETA HCG BLOOD, ED (MC, WL, AP ONLY): I-stat hCG, quantitative: <5 m[IU]/mL (ref <5)

## 2016-09-12 MED ORDER — DIPHENOXYLATE-ATROPINE 2.5-0.025 MG PO TABS
2.0000 | ORAL_TABLET | Freq: Once | ORAL | Status: AC
  Administered 2016-09-12: 2 via ORAL
  Filled 2016-09-12: qty 2

## 2016-09-12 MED ORDER — METOCLOPRAMIDE HCL 5 MG/ML IJ SOLN
10.0000 mg | Freq: Once | INTRAMUSCULAR | Status: AC
  Administered 2016-09-12: 10 mg via INTRAVENOUS
  Filled 2016-09-12: qty 2

## 2016-09-12 MED ORDER — ONDANSETRON 4 MG PO TBDP: 4.0000 mg | ORAL_TABLET | Freq: Four times a day (QID) | ORAL | 0 refills | Status: AC | PRN

## 2016-09-12 MED ORDER — ONDANSETRON HCL 4 MG/2ML IJ SOLN
4.0000 mg | Freq: Once | INTRAMUSCULAR | Status: DC | PRN
  Filled 2016-09-12: qty 2

## 2016-09-12 MED ORDER — SODIUM CHLORIDE 0.9 % IV BOLUS (SEPSIS)
1000.0000 mL | Freq: Once | INTRAVENOUS | Status: AC
Start: 2016-09-12 — End: 2016-09-12
  Administered 2016-09-12: 1000 mL via INTRAVENOUS

## 2016-09-12 MED ORDER — SODIUM CHLORIDE 0.9 % IV BOLUS (SEPSIS)
1000.0000 mL | Freq: Once | INTRAVENOUS | Status: AC
  Administered 2016-09-12: 1000 mL via INTRAVENOUS

## 2016-09-12 NOTE — ED Notes (Signed)
Pt reminded of need for urine 

## 2016-09-12 NOTE — ED Notes (Signed)
Bed: WA08 Expected date: 09/12/16 Expected time: 11:42 AM Means of arrival: Ambulance Comments: N/V

## 2016-09-12 NOTE — ED Notes (Signed)
Pt aware of need for urine specimen. 

## 2016-09-12 NOTE — ED Notes (Signed)
Pt eating ice chips

## 2016-09-12 NOTE — ED Provider Notes (Signed)
[facility] DEPT Provider Note   CSN: [id number] Arrival date & time: 09/12/16  1143     History   Chief Complaint No chief complaint on file.   HPI Alexandria Leblanc is a 46 y.o. female.  The history is provided by the patient.  GI Problem  This is a new problem. The current episode started 3 to 5 hours ago. Episode frequency: frequent. The problem has not changed since onset.Pertinent negatives include no chest pain, no abdominal pain, no headaches and no shortness of breath. Nothing aggravates the symptoms. Nothing relieves the symptoms. The treatment provided no relief.   Has had N/V/D since this am. No sick contact. No recent Abx or trips. Possible suspicious food. intake.  Past Medical History:  Diagnosis Date  . Asthma   . Genital warts   . GERD (gastroesophageal reflux disease)   . Hypertension   . Migraines   . Type 2 diabetes mellitus [facility])     Patient Active Problem List   Diagnosis Date Noted  . Acute bilateral low back pain with bilateral sciatica 03/05/2016  . Preventative health care 01/21/2016  . Asthma, moderate persistent 08/18/2015  . Genital herpes 08/18/2015  . Essential hypertension 08/18/2015  . Type 2 diabetes mellitus without complication, without long-term current use of insulin ([place]) 08/18/2015  . Esophageal reflux 08/18/2015    No past surgical history on file.  OB History    No data available       Home Medications    Prior to Admission medications   Medication Sig Start Date End Date Taking? Authorizing Provider  acyclovir (ZOVIRAX) 400 MG tablet TAKE 1 TABLET BY MOUTH TWICE A DAY 05/26/16   Pleas Koch, NP  albuterol (PROVENTIL HFA;VENTOLIN HFA) 108 (90 Base) MCG/ACT inhaler Inhale 2 puffs into the lungs every 6 (six) hours as needed for wheezing or shortness of breath. 03/08/16   Pleas Koch, NP  fluticasone (FLONASE) 50 MCG/ACT nasal spray Place 2 sprays into both nostrils daily. 05/14/16   Pleas Koch, NP    fluticasone furoate-vilanterol (BREO ELLIPTA) 100-25 MCG/INH AEPB Inhale 1 puff into the lungs daily. 03/05/16   Pleas Koch, NP  hydrochlorothiazide (HYDRODIURIL) 25 MG tablet Take 12.5 mg by mouth daily.    Historical Provider, MD  imiquimod Leroy Sea) 5 % cream Apply a thin lay three times weekly, on alternating days, until warts is gone. Leave on for 6 to 10 hours. 03/11/16   Pleas Koch, NP  losartan (COZAAR) 100 MG tablet Take 1 tablet (100 mg total) by mouth daily. Patient taking differently: Take 50 mg by mouth daily.  11/19/15   Pleas Koch, NP  lovastatin (MEVACOR) 20 MG tablet TAKE 1 TABLET BY MOUTH AT BEDTIME 05/11/16   Pleas Koch, NP  metFORMIN (GLUCOPHAGE) 500 MG tablet TAKE 1 TABLET BY MOUTH 2 TIMES DAILY WITH A MEAL 08/20/16   Pleas Koch, NP  omeprazole (PRILOSEC) 20 MG capsule Take 1 capsule (20 mg total) by mouth daily. 06/08/16   Pleas Koch, NP  ondansetron (ZOFRAN ODT) 4 MG disintegrating tablet Take 1 tablet (4 mg total) by mouth every 6 (six) hours as needed for nausea or vomiting. 09/12/16 09/17/16  Fatima Blank, MD  predniSONE (DELTASONE) 10 MG tablet Take 4 tablets for 2 days, then 3 tablets for 2 days, then 2 tablets for 2 days, then 1 tablet for 2 days. 03/05/16   Pleas Koch, NP    Family History Family  History  Problem Relation Age of Onset  . Heart disease Mother   . COPD Father   . Hypertension Mother   . Hypertension Father   . Hyperlipidemia Brother   . Prostate cancer Paternal Uncle     Social History Social History  Substance Use Topics  . Smoking status: Current Every Day Smoker    Packs/day: 1.00    Types: Cigarettes  . Smokeless tobacco: Not on file  . Alcohol use 0.0 oz/week     Comment: occ     Allergies   Penicillins and Sulfa antibiotics   Review of Systems Review of Systems  Respiratory: Negative for shortness of breath.   Cardiovascular: Negative for chest pain.  Gastrointestinal:  Negative for abdominal pain.  Neurological: Negative for headaches.  All other systems are reviewed and are negative for acute change except as noted in the HPI    Physical Exam Updated Vital Signs BP 120/68 (BP Location: Left Arm)   Pulse 91   Temp 99.4 F (37.4 C) (Oral)   Resp (!) 22   Ht 5\' 4"  (1.626 m)   Wt 203 lb (92.1 kg)   LMP 08/23/2016 Comment: irregular menses  SpO2 96%   BMI 34.84 kg/m   Physical Exam  Constitutional: She is oriented to person, place, and time. She appears well-developed and well-nourished. No distress.  HENT:  Head: Normocephalic and atraumatic.  Nose: Nose normal.  Eyes: Conjunctivae and EOM are normal. Pupils are equal, round, and reactive to light. Right eye exhibits no discharge. Left eye exhibits no discharge. No scleral icterus.  Neck: Normal range of motion. Neck supple.  Cardiovascular: Normal rate and regular rhythm.  Exam reveals no gallop and no friction rub.   No murmur heard. Pulmonary/Chest: Effort normal and breath sounds normal. No stridor. No respiratory distress. She has no rales.  Abdominal: Soft. She exhibits no distension. There is generalized tenderness (discomfort). There is no rigidity, no rebound and no guarding.  Musculoskeletal: She exhibits no edema or tenderness.  Neurological: She is alert and oriented to person, place, and time.  Skin: Skin is warm and dry. No rash noted. She is not diaphoretic. No erythema.  Psychiatric: She has a normal mood and affect.  Vitals reviewed.    ED Treatments / Results  Labs (all labs ordered are listed, but only abnormal results are displayed) Labs Reviewed  COMPREHENSIVE METABOLIC PANEL - Abnormal; Notable for the following:       Result Value   Glucose, Bld 191 (*)    Total Protein 8.4 (*)    All other components within normal limits  CBC - Abnormal; Notable for the following:    WBC 11.8 (*)    RBC 6.00 (*)    Hemoglobin 18.9 (*)    HCT 54.5 (*)    Platelets 142 (*)     All other components within normal limits  CBG MONITORING, ED - Abnormal; Notable for the following:    Glucose-Capillary 201 (*)    All other components within normal limits  LIPASE, BLOOD  I-STAT BETA HCG BLOOD, ED ([facility], [facility], [facility] ONLY)    EKG  EKG Interpretation None       Radiology No results found.  Procedures Procedures (including critical care time)  Medications Ordered in ED Medications  ondansetron (ZOFRAN) injection 4 mg (not administered)  metoCLOPramide (REGLAN) injection 10 mg (10 mg Intravenous Given 09/12/16 1419)  sodium chloride 0.9 % bolus 1,000 mL (0 mLs Intravenous Stopped 09/12/16 1420)  Initial Impression / Assessment and Plan / ED Course  I have reviewed the triage vital signs and the nursing notes.  Pertinent labs & imaging results that were available during my care of the patient were reviewed by me and considered in my medical decision making (see chart for details).     46 y.o. female presents with vomiting, diarrhea for 1 day. possible suspicious food intake. No historical evidence to suggest suspicious toxic ingestion or exposure. decreased oral tolerance. Rest of history as above.  Patient appears well, not in distress, and with no signs of toxicity or dehydration. Abdomen not peritonitic.  Rest of the exam as above  Most consistent with viral gastroenteritis vs food poisoning.   No evidence of suggestive of cholinergic toxidrome. Doubt appendicitis, diverticulitis, severe colitis, dysentery.    Able to tolerate oral intake in the ED.  Discussed symptomatic treatment with the patient and they will follow closely with their PCP.   Final Clinical Impressions(s) / ED Diagnoses   Final diagnoses:  Nausea vomiting and diarrhea   Disposition: Discharge  Condition: Good  I have discussed the results, Dx and Tx plan with the patient who expressed understanding and agree(s) with the plan. Discharge instructions discussed at great length. The  patient was given strict return precautions who verbalized understanding of the instructions. No further questions at time of discharge.    New Prescriptions   ONDANSETRON (ZOFRAN ODT) 4 MG DISINTEGRATING TABLET    Take 1 tablet (4 mg total) by mouth every 6 (six) hours as needed for nausea or vomiting.    Follow Up: Pleas Koch, NP [place] [id number] [phone]  Schedule an appointment as soon as possible for a visit  in 5-7 days, If symptoms do not improve or  worsen      Fatima Blank, MD 09/12/16 [phone]

## 2016-09-12 NOTE — ED Triage Notes (Signed)
Pt BIB EMS from home, N/V/D since 0700. Per EMS diarrhea dark brown, yellowish color emesis, generalized weakness. Denies dizziness, chest pain.

## 2016-09-13 ENCOUNTER — Encounter: Payer: Self-pay | Admitting: Primary Care

## 2016-09-13 NOTE — Telephone Encounter (Signed)
See My Chart message and schedule for follow up office visit.  Thanks.

## 2016-09-14 NOTE — Telephone Encounter (Signed)
Spoken to patient. Follow up schedule on 09/15/2016.

## 2016-09-15 ENCOUNTER — Ambulatory Visit (INDEPENDENT_AMBULATORY_CARE_PROVIDER_SITE_OTHER): Payer: Self-pay | Admitting: Primary Care

## 2016-09-15 VITALS — BP 144/82 | HR 83 | Temp 98.7°F | Ht 63.0 in | Wt 191.8 lb

## 2016-09-15 DIAGNOSIS — I10 Essential (primary) hypertension: Secondary | ICD-10-CM

## 2016-09-15 DIAGNOSIS — E785 Hyperlipidemia, unspecified: Secondary | ICD-10-CM

## 2016-09-15 DIAGNOSIS — J302 Other seasonal allergic rhinitis: Secondary | ICD-10-CM

## 2016-09-15 DIAGNOSIS — J4540 Moderate persistent asthma, uncomplicated: Secondary | ICD-10-CM

## 2016-09-15 DIAGNOSIS — M5441 Lumbago with sciatica, right side: Secondary | ICD-10-CM

## 2016-09-15 DIAGNOSIS — E119 Type 2 diabetes mellitus without complications: Secondary | ICD-10-CM

## 2016-09-15 DIAGNOSIS — M5442 Lumbago with sciatica, left side: Secondary | ICD-10-CM

## 2016-09-15 DIAGNOSIS — G8929 Other chronic pain: Secondary | ICD-10-CM

## 2016-09-15 LAB — LIPID PANEL
CHOL/HDL RATIO: 7
Cholesterol: 140 mg/dL (ref 0–200)
HDL: 21.4 mg/dL — ABNORMAL LOW (ref >39.00)
NonHDL: 118.12
Triglycerides: 395 mg/dL — ABNORMAL HIGH (ref 0.0–149.0)
VLDL: 79 mg/dL — AB (ref 0.0–40.0)

## 2016-09-15 LAB — HEMOGLOBIN A1C: Hgb A1c MFr Bld: 6.5 % (ref 4.6–6.5)

## 2016-09-15 LAB — LDL CHOLESTEROL, DIRECT: LDL DIRECT: 57 mg/dL

## 2016-09-15 MED ORDER — FLUTICASONE PROPIONATE 50 MCG/ACT NA SUSP: 1.0000 | Freq: Two times a day (BID) | NASAL | 3 refills | Status: DC

## 2016-09-15 MED ORDER — DICLOFENAC SODIUM 75 MG PO TBEC: 75.0000 mg | DELAYED_RELEASE_TABLET | Freq: Two times a day (BID) | ORAL | 0 refills | Status: DC | PRN

## 2016-09-15 NOTE — Assessment & Plan Note (Signed)
Overall improved on Breo and albuterol, some wheezing and SOB during seasonal changes. Start Zyrtec HS. She will update if no improvement.

## 2016-09-15 NOTE — Patient Instructions (Signed)
Complete lab work prior to leaving today. I will notify you of your results once received.   You may try diclofenac 75 mg tablets for back pain. Take 1 tablet by mouth twice daily as needed for pain.  Work to stay hydrated and advance your diet as tolerated. Take a look at the information below.  Start Allegra, Claritin, or Zyrtec daily for allergies/asthma. Get the off brand.  Follow up in 6 months for re-evaluation. It was a pleasure to see you today!   Food Choices to Help Relieve Diarrhea, Adult When you have diarrhea, the foods you eat and your eating habits are very important. Choosing the right foods and drinks can help:  Relieve diarrhea.  Replace lost fluids and nutrients.  Prevent dehydration. What general guidelines should I follow? Relieving diarrhea   Choose foods with less than 2 g or .07 oz. of fiber per serving.  Limit fats to less than 8 tsp (38 g or 1.34 oz.) a day.  Avoid the following:  Foods and beverages sweetened with high-fructose corn syrup, honey, or sugar alcohols such as xylitol, sorbitol, and mannitol.  Foods that contain a lot of fat or sugar.  Fried, greasy, or spicy foods.  High-fiber grains, breads, and cereals.  Raw fruits and vegetables.  Eat foods that are rich in probiotics. These foods include dairy products such as yogurt and fermented milk products. They help increase healthy bacteria in the stomach and intestines (gastrointestinal tract, or GI tract).  If you have lactose intolerance, avoid dairy products. These may make your diarrhea worse.  Take medicine to help stop diarrhea (antidiarrheal medicine) only as told by your health care provider. Replacing nutrients   Eat small meals or snacks every 3-4 hours.  Eat bland foods, such as white rice, toast, or baked potato, until your diarrhea starts to get better. Gradually reintroduce nutrient-rich foods as tolerated or as told by your health care provider. This  includes:  Well-cooked protein foods.  Peeled, seeded, and soft-cooked fruits and vegetables.  Low-fat dairy products.  Take vitamin and mineral supplements as told by your health care provider. Preventing dehydration    Start by sipping water or a special solution to prevent dehydration (oral rehydration solution, ORS). Urine that is clear or pale yellow means that you are getting enough fluid.  Try to drink at least 8-10 cups of fluid each day to help replace lost fluids.  You may add other liquids in addition to water, such as clear juice or decaffeinated sports drinks, as tolerated or as told by your health care provider.  Avoid drinks with caffeine, such as coffee, tea, or soft drinks.  Avoid alcohol. What foods are recommended? The items listed may not be a complete list. Talk with your health care provider about what dietary choices are best for you. Grains  White rice. White, [place], or pita breads (fresh or toasted), including plain rolls, buns, or bagels. White pasta. Saltine, soda, or graham crackers. Pretzels. Low-fiber cereal. Cooked cereals made with water (such as cornmeal, farina, or cream cereals). Plain muffins. Matzo. Melba toast. Zwieback. Vegetables  Potatoes (without the skin). Most well-cooked and canned vegetables without skins or seeds. Tender lettuce. Fruits  Apple sauce. Fruits canned in juice. Cooked apricots, cherries, grapefruit, peaches, pears, or plums. Fresh bananas and cantaloupe. Meats and other protein foods  Baked or boiled chicken. Eggs. Tofu. Fish. Seafood. Smooth nut butters. Ground or well-cooked tender beef, ham, veal, lamb, pork, or poultry. Dairy  Plain yogurt, kefir,  and unsweetened liquid yogurt. Lactose-free milk, buttermilk, skim milk, or soy milk. Low-fat or nonfat hard cheese. Beverages  Water. Low-calorie sports drinks. Fruit juices without pulp. Strained tomato and vegetable juices. Decaffeinated teas. Sugar-free beverages not  sweetened with sugar alcohols. Oral rehydration solutions, if approved by your health care provider. Seasoning and other foods  Bouillon, broth, or soups made from recommended foods. What foods are not recommended? The items listed may not be a complete list. Talk with your health care provider about what dietary choices are best for you. Grains  Whole grain, whole wheat, bran, or rye breads, rolls, pastas, and crackers. Wild or brown rice. Whole grain or bran cereals. Barley. Oats and oatmeal. Corn tortillas or taco shells. Granola. Popcorn. Vegetables  Raw vegetables. Fried vegetables. Cabbage, broccoli, Brussels sprouts, artichokes, baked beans, beet greens, corn, kale, legumes, peas, sweet potatoes, and yams. Potato skins. Cooked spinach and cabbage. Fruits  Dried fruit, including raisins and dates. Raw fruits. Stewed or dried prunes. Canned fruits with syrup. Meat and other protein foods  Fried or fatty meats. Deli meats. Chunky nut butters. Nuts and seeds. Beans and lentils. Berniece Salines. Hot dogs. Sausage. Dairy  High-fat cheeses. Whole milk, chocolate milk, and beverages made with milk, such as milk shakes. Half-and-half. Cream. sour cream. Ice cream. Beverages  Caffeinated beverages (such as coffee, tea, soda, or energy drinks). Alcoholic beverages. Fruit juices with pulp. Prune juice. Soft drinks sweetened with high-fructose corn syrup or sugar alcohols. High-calorie sports drinks. Fats and oils  Butter. Cream sauces. Margarine. Salad oils. Plain salad dressings. Olives. Avocados. Mayonnaise. Sweets and desserts  Sweet rolls, doughnuts, and sweet breads. Sugar-free desserts sweetened with sugar alcohols such as xylitol and sorbitol. Seasoning and other foods  Honey. Hot sauce. Chili powder. Gravy. Cream-based or milk-based soups. Pancakes and waffles. Summary  When you have diarrhea, the foods you eat and your eating habits are very important.  Make sure you get at least 8-10 cups of  fluid each day, or enough to keep your urine clear or pale yellow.  Eat bland foods and gradually reintroduce healthy, nutrient-rich foods as tolerated, or as told by your health care provider.  Avoid high-fiber, fried, greasy, or spicy foods. This information is not intended to replace advice given to you by your health care provider. Make sure you discuss any questions you have with your health care provider. Document Released: 08/07/2003 Document Revised: 05/14/2016 Document Reviewed: 05/14/2016 Elsevier Interactive Patient Education  2017 [employer].

## 2016-09-15 NOTE — Progress Notes (Signed)
Pre visit review using our clinic review tool, if applicable. No additional management support is needed unless otherwise documented below in the visit note. 

## 2016-09-15 NOTE — Assessment & Plan Note (Signed)
Will trial diclofenac BID. Discussed not to use Aleve or ibuprofen. She cannot afford PT, will continue to monitor.

## 2016-09-15 NOTE — Progress Notes (Signed)
Subjective:    Patient ID: Alexandria Leblanc, female    DOB: 1970-06-01, 46 y.o.   MRN: [id number]  HPI  Alexandria Leblanc is a 46 year old female who presents today for follow up.  1) Type 2 Diabetes: Currently managed on metformin 500 mg BID. Her last A1C was 6.6 in August 2017. She's checking her sugars infrequently which ranges in 140-150. Her lowest number was 82 after not eating all day.  2) Hyperlipidemia: Currently managed on lovastatin 20 mg. Her last lipid panel was above goal in December 2017 but improved. She was managed on Fish Oil but stopped this after three weeks as this caused diarrhea.   3) Essential Hypertension: Currently managed on HCTZ 25 mg, Losartan 100 mg. She's compliant to her BP medication, is not checking her BP at home.  4) Asthma: Currently managed on albuterol, Breo Ellipta. Overall she's noticed improvement in wheezing. She does experience wheezing and shortness of breath occasional and will use her albuterol PRN. She's using her albuterol inhaler sometimes three times daily during seasonal changes, sometimes doesn't use it all.  5) Nausea/Vomiting/Diarrhea: Also with fatigue. Symptoms began 3 days ago. She presented to [facility] on 09/12/16 and was diagnosed with viral gastroenteritis vs food poisoning. She continues to feel weak with some dizziness. She denies vomiting since Sunday, her diarrhea has improved, denies fevers and abdominal pain. She's taken the Zofran several times with improvement. She's able to eat small meals and stay hydrated with water.  Review of Systems  Constitutional: Positive for fatigue. Negative for chills and fever.  Respiratory: Negative for shortness of breath and wheezing.   Cardiovascular: Negative for chest pain.  Gastrointestinal: Positive for diarrhea and nausea. Negative for abdominal pain and vomiting.  Neurological: Negative for dizziness and numbness.       Past Medical History:  Diagnosis Date  . Asthma   . Genital warts     . GERD (gastroesophageal reflux disease)   . Hypertension   . Migraines   . Type 2 diabetes mellitus ([place])      Social History   Social History  . Marital status: Unknown    Spouse name: N/A  . Number of children: N/A  . Years of education: N/A   Occupational History  . Not on file.   Social History Main Topics  . Smoking status: Current Every Day Smoker    Packs/day: 1.00    Types: Cigarettes  . Smokeless tobacco: Not on file  . Alcohol use 0.0 oz/week     Comment: occ  . Drug use: No  . Sexual activity: Not on file   Other Topics Concern  . Not on file   Social History Narrative   Single.   No children.   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           No past surgical history on file.  Family History  Problem Relation Age of Onset  . Heart disease Mother   . COPD Father   . Hypertension Mother   . Hypertension Father   . Hyperlipidemia Brother   . Prostate cancer Paternal Uncle     Allergies  Allergen Reactions  . Penicillins Hives and Itching  . Sulfa Antibiotics     Current Outpatient Prescriptions on File Prior to Visit  Medication Sig Dispense Refill  . acyclovir (ZOVIRAX) 400 MG tablet TAKE 1 TABLET BY MOUTH TWICE A DAY 180 tablet 1  .  albuterol (PROVENTIL HFA;VENTOLIN HFA) 108 (90 Base) MCG/ACT inhaler Inhale 2 puffs into the lungs every 6 (six) hours as needed for wheezing or shortness of breath. 1 Inhaler 11  . hydrochlorothiazide (HYDRODIURIL) 25 MG tablet Take 12.5 mg by mouth daily.    . imiquimod (ALDARA) 5 % cream Apply a thin lay three times weekly, on alternating days, until warts is gone. Leave on for 6 to 10 hours. 12 each 0  . losartan (COZAAR) 100 MG tablet Take 1 tablet (100 mg total) by mouth daily. (Patient taking differently: Take 50 mg by mouth daily. ) 90 tablet 3  . lovastatin (MEVACOR) 20 MG tablet TAKE 1 TABLET BY MOUTH AT BEDTIME 90 tablet 0  . metFORMIN (GLUCOPHAGE) 500 MG tablet  TAKE 1 TABLET BY MOUTH 2 TIMES DAILY WITH A MEAL 60 tablet 0  . omeprazole (PRILOSEC) 20 MG capsule Take 1 capsule (20 mg total) by mouth daily. 90 capsule 1  . ondansetron (ZOFRAN ODT) 4 MG disintegrating tablet Take 1 tablet (4 mg total) by mouth every 6 (six) hours as needed for nausea or vomiting. 20 tablet 0  . fluticasone furoate-vilanterol (BREO ELLIPTA) 100-25 MCG/INH AEPB Inhale 1 puff into the lungs daily. (Patient not taking: Reported on 09/15/2016) 1 each 11   No current facility-administered medications on file prior to visit.     BP (!) 144/82   Pulse 83   Temp 98.7 F (37.1 C) (Oral)   Ht 5\' 3"  (1.6 m)   Wt 191 lb 12.8 oz (87 kg)   LMP 08/23/2016 Comment: irregular menses  SpO2 99%   BMI 33.98 kg/m    Objective:   Physical Exam  Constitutional: She appears well-nourished.  Neck: Neck supple.  Cardiovascular: Normal rate and regular rhythm.   Pulmonary/Chest: She has no decreased breath sounds. She has wheezes in the right upper field, the right lower field and the left upper field.  Mild wheezing  Abdominal: Soft. Bowel sounds are normal. There is no tenderness.  Skin: Skin is warm and dry.          Assessment & Plan:

## 2016-09-15 NOTE — Assessment & Plan Note (Signed)
Slightly above goal today, likely from acute GI illness. Will have her monitor at home and report elevated readings.

## 2016-09-15 NOTE — Assessment & Plan Note (Signed)
Due for repeat lipids today. Continue statin for now. Could not tolerate Fish Oil, consider fenofibrate.

## 2016-09-15 NOTE — Assessment & Plan Note (Signed)
Due for A1C recheck today. Continue Metformin BID for now. Foot exam unremarkable. Managed on ARB and statin.

## 2016-09-16 ENCOUNTER — Other Ambulatory Visit: Payer: Self-pay | Admitting: Primary Care

## 2016-09-16 DIAGNOSIS — E781 Pure hyperglyceridemia: Secondary | ICD-10-CM

## 2016-09-16 MED ORDER — FENOFIBRATE 48 MG PO TABS: 48.0000 mg | ORAL_TABLET | Freq: Every day | ORAL | 1 refills | Status: DC

## 2016-09-21 ENCOUNTER — Encounter: Payer: Self-pay | Admitting: Primary Care

## 2016-09-21 DIAGNOSIS — R1084 Generalized abdominal pain: Secondary | ICD-10-CM

## 2016-09-22 ENCOUNTER — Other Ambulatory Visit (INDEPENDENT_AMBULATORY_CARE_PROVIDER_SITE_OTHER): Payer: Self-pay

## 2016-09-22 ENCOUNTER — Encounter: Payer: Self-pay | Admitting: Primary Care

## 2016-09-22 DIAGNOSIS — R1084 Generalized abdominal pain: Secondary | ICD-10-CM

## 2016-09-22 LAB — CBC WITH DIFFERENTIAL/PLATELET
BASOS ABS: 0 10*3/uL (ref 0.0–0.1)
Basophils Relative: 0.4 % (ref 0.0–3.0)
Eosinophils Absolute: 0.1 10*3/uL (ref 0.0–0.7)
Eosinophils Relative: 0.8 % (ref 0.0–5.0)
HCT: 42 % (ref 36.0–46.0)
Hemoglobin: 14.1 g/dL (ref 12.0–15.0)
LYMPHS ABS: 4 10*3/uL (ref 0.7–4.0)
Lymphocytes Relative: 35 % (ref 12.0–46.0)
MCHC: 33.6 g/dL (ref 30.0–36.0)
MCV: 91.2 fl (ref 78.0–100.0)
MONO ABS: 0.8 10*3/uL (ref 0.1–1.0)
Monocytes Relative: 6.6 % (ref 3.0–12.0)
NEUTROS PCT: 57.2 % (ref 43.0–77.0)
Neutro Abs: 6.6 10*3/uL (ref 1.4–7.7)
PLATELETS: 170 10*3/uL (ref 150.0–400.0)
RBC: 4.61 Mil/uL (ref 3.87–5.11)
RDW: 13.2 % (ref 11.5–15.5)
WBC: 11.5 10*3/uL — ABNORMAL HIGH (ref 4.0–10.5)

## 2016-10-11 ENCOUNTER — Encounter: Payer: Self-pay | Admitting: Primary Care

## 2016-10-11 ENCOUNTER — Other Ambulatory Visit: Payer: Self-pay | Admitting: Primary Care

## 2016-10-11 DIAGNOSIS — I10 Essential (primary) hypertension: Secondary | ICD-10-CM

## 2016-10-11 DIAGNOSIS — E119 Type 2 diabetes mellitus without complications: Secondary | ICD-10-CM

## 2016-10-11 MED ORDER — HYDROCHLOROTHIAZIDE 25 MG PO TABS: 25.0000 mg | ORAL_TABLET | Freq: Every day | ORAL | 2 refills | Status: DC

## 2016-10-13 ENCOUNTER — Telehealth: Payer: Self-pay | Admitting: Primary Care

## 2016-10-13 NOTE — Telephone Encounter (Signed)
Pt called stating she was returning a missed call.

## 2016-10-14 ENCOUNTER — Encounter: Payer: Self-pay | Admitting: Primary Care

## 2016-10-14 NOTE — Telephone Encounter (Signed)
Spoken to patient that received a letter in the mail regarding the need proof of income for the [place] patient assistance program application.  Patient stated that she will bring it by the office soon.

## 2016-10-18 ENCOUNTER — Telehealth: Payer: Self-pay | Admitting: Primary Care

## 2016-10-18 NOTE — Telephone Encounter (Signed)
Pt's mother dropped off pay stubs. Placed in [place] tower.

## 2016-10-18 NOTE — Telephone Encounter (Signed)
Pay stubs has been faxed to [place] patient assistance as requested.

## 2016-10-18 NOTE — Telephone Encounter (Signed)
Faxed to pay stubs to [place] assistance program application.

## 2016-11-15 ENCOUNTER — Other Ambulatory Visit: Payer: Self-pay | Admitting: Primary Care

## 2016-11-15 DIAGNOSIS — E119 Type 2 diabetes mellitus without complications: Secondary | ICD-10-CM

## 2016-11-15 DIAGNOSIS — A6000 Herpesviral infection of urogenital system, unspecified: Secondary | ICD-10-CM

## 2016-11-30 ENCOUNTER — Encounter: Payer: Self-pay | Admitting: Primary Care

## 2016-12-14 ENCOUNTER — Other Ambulatory Visit: Payer: Self-pay | Admitting: Primary Care

## 2016-12-14 ENCOUNTER — Encounter: Payer: Self-pay | Admitting: Primary Care

## 2016-12-14 DIAGNOSIS — K219 Gastro-esophageal reflux disease without esophagitis: Secondary | ICD-10-CM

## 2016-12-14 NOTE — Telephone Encounter (Signed)
Spoken to patient and I went ahead and re-schedule patient's lab appt

## 2016-12-14 NOTE — Telephone Encounter (Signed)
Patient returned your call.  She said she is at home now.

## 2016-12-15 ENCOUNTER — Other Ambulatory Visit (INDEPENDENT_AMBULATORY_CARE_PROVIDER_SITE_OTHER): Payer: Self-pay

## 2016-12-15 DIAGNOSIS — E785 Hyperlipidemia, unspecified: Secondary | ICD-10-CM

## 2016-12-15 LAB — LDL CHOLESTEROL, DIRECT: LDL DIRECT: 109 mg/dL

## 2016-12-15 LAB — LIPID PANEL
CHOL/HDL RATIO: 5
Cholesterol: 188 mg/dL (ref 0–200)
HDL: 38.3 mg/dL — ABNORMAL LOW (ref >39.00)
NONHDL: 149.97
Triglycerides: 228 mg/dL — ABNORMAL HIGH (ref 0.0–149.0)
VLDL: 45.6 mg/dL — ABNORMAL HIGH (ref 0.0–40.0)

## 2016-12-17 ENCOUNTER — Other Ambulatory Visit: Payer: Self-pay

## 2016-12-20 ENCOUNTER — Telehealth: Payer: Self-pay | Admitting: Primary Care

## 2016-12-20 NOTE — Telephone Encounter (Signed)
Per DPR, left detail message of Kate's comments for patient. 

## 2016-12-20 NOTE — Telephone Encounter (Signed)
PT returned your call from Friday 7/20. She gets off work at [employer] today but said you can leave a detailed msg on vm if before then.

## 2016-12-21 ENCOUNTER — Encounter: Payer: Self-pay | Admitting: Primary Care

## 2016-12-21 ENCOUNTER — Other Ambulatory Visit: Payer: Self-pay | Admitting: Primary Care

## 2016-12-21 DIAGNOSIS — E785 Hyperlipidemia, unspecified: Secondary | ICD-10-CM

## 2016-12-21 MED ORDER — LOVASTATIN 20 MG PO TABS: 20.0000 mg | ORAL_TABLET | Freq: Every day | ORAL | 3 refills | Status: DC

## 2016-12-21 NOTE — Progress Notes (Signed)
Per patient's last telephone note to leave detail message. I have left detail message of Kate's comments for patient to call back.

## 2016-12-21 NOTE — Telephone Encounter (Signed)
Alexandria Leblanc, will you please ensure patient doesn't get a no show fee? She did get her labs. See My Chart message.

## 2016-12-22 NOTE — Telephone Encounter (Signed)
Alexandria Leblanc, please make sure that this patient's lab appointment for 12/17/16 that is showing "No Show" be properly cancelled and NO CHARGE be applied for this.  Appears this was a duplicate appointment made in error on our end and therefore patient should not be charged.   Thanks.

## 2016-12-23 ENCOUNTER — Encounter: Payer: Self-pay | Admitting: Primary Care

## 2016-12-30 ENCOUNTER — Telehealth: Payer: Self-pay | Admitting: Primary Care

## 2016-12-31 NOTE — Telephone Encounter (Signed)
Opened in error

## 2017-02-06 ENCOUNTER — Encounter: Payer: Self-pay | Admitting: Primary Care

## 2017-02-12 ENCOUNTER — Other Ambulatory Visit: Payer: Self-pay | Admitting: Primary Care

## 2017-02-12 DIAGNOSIS — I10 Essential (primary) hypertension: Secondary | ICD-10-CM

## 2017-02-12 DIAGNOSIS — E119 Type 2 diabetes mellitus without complications: Secondary | ICD-10-CM

## 2017-02-12 DIAGNOSIS — A6000 Herpesviral infection of urogenital system, unspecified: Secondary | ICD-10-CM

## 2017-03-22 ENCOUNTER — Other Ambulatory Visit: Payer: Self-pay | Admitting: Primary Care

## 2017-03-22 ENCOUNTER — Encounter: Payer: Self-pay | Admitting: Primary Care

## 2017-03-22 ENCOUNTER — Ambulatory Visit (INDEPENDENT_AMBULATORY_CARE_PROVIDER_SITE_OTHER): Payer: 59 | Admitting: Primary Care

## 2017-03-22 VITALS — BP 144/94 | HR 62 | Temp 98.2°F | Ht 63.0 in | Wt 204.8 lb

## 2017-03-22 DIAGNOSIS — E785 Hyperlipidemia, unspecified: Secondary | ICD-10-CM

## 2017-03-22 DIAGNOSIS — M5441 Lumbago with sciatica, right side: Principal | ICD-10-CM

## 2017-03-22 DIAGNOSIS — I10 Essential (primary) hypertension: Secondary | ICD-10-CM

## 2017-03-22 DIAGNOSIS — E119 Type 2 diabetes mellitus without complications: Secondary | ICD-10-CM | POA: Diagnosis not present

## 2017-03-22 DIAGNOSIS — G8929 Other chronic pain: Secondary | ICD-10-CM

## 2017-03-22 DIAGNOSIS — R002 Palpitations: Secondary | ICD-10-CM | POA: Diagnosis not present

## 2017-03-22 DIAGNOSIS — M5442 Lumbago with sciatica, left side: Principal | ICD-10-CM

## 2017-03-22 LAB — LIPID PANEL
CHOL/HDL RATIO: 4
Cholesterol: 176 mg/dL (ref 0–200)
HDL: 42.6 mg/dL (ref >39.00)
LDL CALC: 98 mg/dL (ref 0–99)
NONHDL: 133.32
TRIGLYCERIDES: 178 mg/dL — AB (ref 0.0–149.0)
VLDL: 35.6 mg/dL (ref 0.0–40.0)

## 2017-03-22 LAB — BASIC METABOLIC PANEL
BUN: 9 mg/dL (ref 6–23)
CALCIUM: 9.3 mg/dL (ref 8.4–10.5)
CHLORIDE: 103 meq/L (ref 96–112)
CO2: 27 mEq/L (ref 19–32)
CREATININE: 0.5 mg/dL (ref 0.40–1.20)
GFR: 140.76 mL/min (ref >60.00)
Glucose, Bld: 126 mg/dL — ABNORMAL HIGH (ref 70–99)
Potassium: 3.9 mEq/L (ref 3.5–5.1)
SODIUM: 137 meq/L (ref 135–145)

## 2017-03-22 LAB — CBC
HCT: 41.5 % (ref 36.0–46.0)
Hemoglobin: 14.1 g/dL (ref 12.0–15.0)
MCHC: 34 g/dL (ref 30.0–36.0)
MCV: 94.7 fl (ref 78.0–100.0)
Platelets: 137 10*3/uL — ABNORMAL LOW (ref 150.0–400.0)
RBC: 4.38 Mil/uL (ref 3.87–5.11)
RDW: 13.5 % (ref 11.5–15.5)
WBC: 7.4 10*3/uL (ref 4.0–10.5)

## 2017-03-22 LAB — COMPREHENSIVE METABOLIC PANEL
ALT: 22 U/L (ref 0–35)
AST: 23 U/L (ref 0–37)
Albumin: 3.8 g/dL (ref 3.5–5.2)
Alkaline Phosphatase: 74 U/L (ref 39–117)
BUN: 9 mg/dL (ref 6–23)
CHLORIDE: 103 meq/L (ref 96–112)
CO2: 27 meq/L (ref 19–32)
CREATININE: 0.5 mg/dL (ref 0.40–1.20)
Calcium: 9.3 mg/dL (ref 8.4–10.5)
GFR: 140.76 mL/min (ref >60.00)
GLUCOSE: 126 mg/dL — AB (ref 70–99)
Potassium: 3.9 mEq/L (ref 3.5–5.1)
SODIUM: 137 meq/L (ref 135–145)
Total Bilirubin: 0.4 mg/dL (ref 0.2–1.2)
Total Protein: 7.1 g/dL (ref 6.0–8.3)

## 2017-03-22 LAB — HEMOGLOBIN A1C: HEMOGLOBIN A1C: 6.4 % (ref 4.6–6.5)

## 2017-03-22 LAB — TSH: TSH: 1.64 u[IU]/mL (ref 0.35–4.50)

## 2017-03-22 MED ORDER — ATORVASTATIN CALCIUM 20 MG PO TABS: 20.0000 mg | ORAL_TABLET | Freq: Every evening | ORAL | 3 refills | Status: DC

## 2017-03-22 NOTE — Patient Instructions (Addendum)
Complete lab work prior to leaving today. I will notify you of your results once received.   Start exercising. You should be getting 150 minutes of moderate intensity exercise weekly.  Limit fast food, fried food, fatty food.  Start taking Losartan 100 mg once daily. Continue HCTZ 25 mg tablets.  Check your blood pressure daily, around the same time of day, for the next 2 weeks.  Ensure that you have rested for 30 minutes prior to checking your blood pressure. Record your readings and message me with readings in 2 weeks.  Continue lovastatin 20 mg for now, we will likely changes this to atorvastatin.   Schedule a follow up visit in 6 months.   It was a pleasure to see you today!

## 2017-03-22 NOTE — Assessment & Plan Note (Signed)
Above goal, only taking losartan 50 mg and HCTZ 25. discussed to take the entire 100 mg of losartan and HCTZ 25 mg. She will message [place] with readings in 2 weeks.

## 2017-03-22 NOTE — Assessment & Plan Note (Signed)
Due for repeat A1C today. Continue metformin 500 mg BID. Foot exam UTD. Recommended diabetic eye exam. Managed on statin and ARB. Pneumonia vaccination UTD.

## 2017-03-22 NOTE — Progress Notes (Signed)
Subjective:    Patient ID: Alexandria Leblanc, female    DOB: 1971/04/04, 46 y.o.   MRN: [id number]  HPI  Alexandria Leblanc is a 46 year old female who presents today for follow up.  1) Essential Hypertension: Currently managed on losartan 100 mg and HCTZ 25 mg, but is only taking 50 mg of Losartan and HCTZ 25 mg. She is not checking her BP at home.  BP Readings from Last 3 Encounters:  03/22/17 (!) 144/94  09/15/16 (!) 144/82  09/12/16 109/63     2) Hyperlipidemia/Hypertriglyceridemia: Currently managed on lovastatin 20 mg. She is not taking fenofibrate. She is fasting today.  3) Type 2 Diabetes:   Current medications include: Metformin 500 mg BID.  She is checking her blood glucose once monthly or when she feels "shakey". Fasting: 144, 120 Highest reading: 244 Lowest reading: 64 when she had food poisoning   Last A1C: 6.5 in April 2018 Last Eye Exam: Due, plans on scheduling Last Foot Exam:  Completed in April 2018 Pneumonia Vaccination: Completed in 2017 ACE/ARB: Losartan Statin: Lovastatin  Diet currently consists of:  Breakfast: Skips Lunch: Fast food Dinner: Restaurants, meat loaf, pasta, soup, hamburgers, steak Snacks: Sometimes, chips Desserts: None Beverages: Diet [employer], water.  Exercise: She is not currently exercising.  4) Heart Palpitations: Occurred intermittently throughout the day for a 3 week time span, worse at night when laying down. She denies palpitations for 4 days. She denies chest pain, dizziness. She's had headaches daily. She's been taking Goody Powder and Ibuprofen.   BP Readings from Last 3 Encounters:  03/22/17 (!) 144/94  09/15/16 (!) 144/82  09/12/16 109/63       Review of Systems  Constitutional: Negative for fatigue.  Respiratory: Negative for shortness of breath.   Cardiovascular: Positive for palpitations. Negative for chest pain.  Neurological: Positive for headaches. Negative for dizziness and numbness.    Psychiatric/Behavioral: The patient is not nervous/anxious.        Past Medical History:  Diagnosis Date  . Asthma   . Genital warts   . GERD (gastroesophageal reflux disease)   . Hypertension   . Migraines   . Type 2 diabetes mellitus ([place])      Social History   Social History  . Marital status: Unknown    Spouse name: N/A  . Number of children: N/A  . Years of education: N/A   Occupational History  . Not on file.   Social History Main Topics  . Smoking status: Current Every Day Smoker    Packs/day: 1.00    Types: Cigarettes  . Smokeless tobacco: Never Used  . Alcohol use 0.0 oz/week     Comment: occ  . Drug use: No  . Sexual activity: Not on file   Other Topics Concern  . Not on file   Social History Narrative   Single.   No children.   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           No past surgical history on file.  Family History  Problem Relation Age of Onset  . Heart disease Mother   . COPD Father   . Hypertension Mother   . Hypertension Father   . Hyperlipidemia Brother   . Prostate cancer Paternal Uncle     Allergies  Allergen Reactions  . Penicillins Hives and Itching  . Sulfa Antibiotics     Current Outpatient Prescriptions on File Prior to  Visit  Medication Sig Dispense Refill  . acyclovir (ZOVIRAX) 400 MG tablet TAKE 1 TABLET BY MOUTH TWICE (2) DAILY 180 tablet 0  . albuterol (PROVENTIL HFA;VENTOLIN HFA) 108 (90 Base) MCG/ACT inhaler Inhale 2 puffs into the lungs every 6 (six) hours as needed for wheezing or shortness of breath. 1 Inhaler 11  . fenofibrate (TRICOR) 48 MG tablet Take 1 tablet (48 mg total) by mouth daily. 90 tablet 1  . fluticasone (FLONASE) 50 MCG/ACT nasal spray Place 1 spray into both nostrils 2 (two) times daily. 16 g 3  . fluticasone furoate-vilanterol (BREO ELLIPTA) 100-25 MCG/INH AEPB Inhale 1 puff into the lungs daily. 1 each 11  . hydrochlorothiazide (HYDRODIURIL) 25 MG  tablet Take 1 tablet (25 mg total) by mouth daily. 90 tablet 2  . losartan (COZAAR) 100 MG tablet TAKE 1 TABLET BY MOUTH ONCE A DAY 90 tablet 0  . lovastatin (MEVACOR) 20 MG tablet Take 1 tablet (20 mg total) by mouth at bedtime. 90 tablet 3  . metFORMIN (GLUCOPHAGE) 500 MG tablet TAKE 1 TABLET BY MOUTH 2 TIMES DAILY WITH A MEAL 60 tablet 1  . omeprazole (PRILOSEC) 20 MG capsule TAKE 1 CAPSULE BY MOUTH ONCE DAILY 90 capsule 1   No current facility-administered medications on file prior to visit.     BP (!) 144/94   Pulse 62   Temp 98.2 F (36.8 C) (Oral)   Ht 5\' 3"  (1.6 m)   Wt 204 lb 12.8 oz (92.9 kg)   LMP 03/21/2017   SpO2 99%   BMI 36.28 kg/m    Objective:   Physical Exam  Constitutional: She appears well-nourished.  Neck: Neck supple.  Cardiovascular: Normal rate, regular rhythm and normal heart sounds.   Pulmonary/Chest: Effort normal and breath sounds normal.  Skin: Skin is warm and dry.  Psychiatric: She has a normal mood and affect.          Assessment & Plan:  Palpitations:   Present intermittently for 3 weeks, no palpitations x 4 days. Exam today with normal rate and rhythm. ECG: Sinus Brady, rate of 59. No t-wave inversion, ST elevation. Improved when compared to ECG in 2016. Check TSH, CBC.  Doesn't seem to be anxiety related, could be attributed to uncontrolled HTN. Will gain better control over BP. She will send [place] readings in two weeks.  Sheral Flow, NP

## 2017-03-22 NOTE — Telephone Encounter (Signed)
Noted, RX sent to pharmacy

## 2017-03-22 NOTE — Telephone Encounter (Signed)
Ok to refill? Electronically refill request for diclofenac (VOLTAREN) 75 MG EC tablet  This was prescribed on 09/15/2016. Patient never pick up medication since it was expensive but now she would like to have this medication filled at [facility].

## 2017-03-22 NOTE — Assessment & Plan Note (Signed)
Taking lovastatin 20 mg only. No fenofibrate use. Will check lipids today. Likely need to change to atorvastatin now that she has insurance. Will await labs.

## 2017-03-28 ENCOUNTER — Encounter: Payer: Self-pay | Admitting: Primary Care

## 2017-03-30 ENCOUNTER — Encounter: Payer: Self-pay | Admitting: Primary Care

## 2017-03-31 NOTE — Telephone Encounter (Signed)
FYI

## 2017-04-07 ENCOUNTER — Encounter: Payer: Self-pay | Admitting: Family Medicine

## 2017-04-07 ENCOUNTER — Other Ambulatory Visit: Payer: Self-pay

## 2017-04-07 ENCOUNTER — Ambulatory Visit (INDEPENDENT_AMBULATORY_CARE_PROVIDER_SITE_OTHER): Payer: 59 | Admitting: Family Medicine

## 2017-04-07 VITALS — BP 100/60 | HR 75 | Temp 98.5°F | Ht 63.0 in | Wt 201.2 lb

## 2017-04-07 DIAGNOSIS — R1031 Right lower quadrant pain: Secondary | ICD-10-CM | POA: Diagnosis not present

## 2017-04-07 LAB — LIPASE: Lipase: 42 U/L (ref 11.0–59.0)

## 2017-04-07 LAB — BASIC METABOLIC PANEL
BUN: 9 mg/dL (ref 6–23)
CHLORIDE: 98 meq/L (ref 96–112)
CO2: 29 meq/L (ref 19–32)
Calcium: 9.3 mg/dL (ref 8.4–10.5)
Creatinine, Ser: 0.83 mg/dL (ref 0.40–1.20)
GFR: 78.42 mL/min (ref >60.00)
Glucose, Bld: 128 mg/dL — ABNORMAL HIGH (ref 70–99)
Potassium: 3.6 mEq/L (ref 3.5–5.1)
SODIUM: 134 meq/L — AB (ref 135–145)

## 2017-04-07 LAB — CBC WITH DIFFERENTIAL/PLATELET
BASOS PCT: 0.4 % (ref 0.0–3.0)
Basophils Absolute: 0 10*3/uL (ref 0.0–0.1)
Eosinophils Absolute: 0.2 10*3/uL (ref 0.0–0.7)
Eosinophils Relative: 1.5 % (ref 0.0–5.0)
HEMATOCRIT: 42.6 % (ref 36.0–46.0)
Hemoglobin: 14.4 g/dL (ref 12.0–15.0)
LYMPHS PCT: 28.6 % (ref 12.0–46.0)
Lymphs Abs: 3 10*3/uL (ref 0.7–4.0)
MCHC: 33.9 g/dL (ref 30.0–36.0)
MCV: 94.5 fl (ref 78.0–100.0)
MONOS PCT: 7.7 % (ref 3.0–12.0)
Monocytes Absolute: 0.8 10*3/uL (ref 0.1–1.0)
NEUTROS ABS: 6.4 10*3/uL (ref 1.4–7.7)
Neutrophils Relative %: 61.8 % (ref 43.0–77.0)
PLATELETS: 158 10*3/uL (ref 150.0–400.0)
RBC: 4.51 Mil/uL (ref 3.87–5.11)
RDW: 13.1 % (ref 11.5–15.5)
WBC: 10.4 10*3/uL (ref 4.0–10.5)

## 2017-04-07 LAB — HEPATIC FUNCTION PANEL
ALBUMIN: 4.3 g/dL (ref 3.5–5.2)
ALK PHOS: 85 U/L (ref 39–117)
ALT: 27 U/L (ref 0–35)
AST: 17 U/L (ref 0–37)
Bilirubin, Direct: 0.1 mg/dL (ref 0.0–0.3)
TOTAL PROTEIN: 7.4 g/dL (ref 6.0–8.3)
Total Bilirubin: 0.4 mg/dL (ref 0.2–1.2)

## 2017-04-07 NOTE — Progress Notes (Signed)
Dr. Frederico Hamman T. Valmore Arabie, MD, [facility] [place], [address] Phone: [phone] Fax: [phone]  04/07/2017  Patient: Alexandria Leblanc, MRN: [id number], DOB: 1970/08/31, 46 y.o.  Primary Physician:  Pleas Koch, NP   Chief Complaint  Patient presents with  . Abdominal Pain    Gas & Bloating   Subjective:   Alexandria Leblanc is a 46 y.o. very pleasant female patient who presents with the following:  46 yo with DM and GERD presents with 10 d of abd pain, lower quadrants with gas and bloating.   10 days ago, went to [place]. Ate some crabmeat and salad, and since then started to feel sick. Had food poisoning a year ago. Salad and tacos back then.   Now, will get sick on her stomach, pain in her stomach. Difficulty - even veg soup. Diffuse lower quadrant pains.   No intercourse in 1 year.  Past Medical History, Surgical History, Social History, Family History, Problem List, Medications, and Allergies have been reviewed and updated if relevant.  Patient Active Problem List   Diagnosis Date Noted  . Hyperlipidemia 09/15/2016  . Chronic low back pain 03/05/2016  . Preventative health care 01/21/2016  . Asthma, moderate persistent 08/18/2015  . Genital herpes 08/18/2015  . Essential hypertension 08/18/2015  . Type 2 diabetes mellitus without complication, without long-term current use of insulin ([place]) 08/18/2015  . Esophageal reflux 08/18/2015    Past Medical History:  Diagnosis Date  . Asthma   . Genital warts   . GERD (gastroesophageal reflux disease)   . Hypertension   . Migraines   . Type 2 diabetes mellitus ([place])     History reviewed. No pertinent surgical history.  Social History   Socioeconomic History  . Marital status: Single    Spouse name: Not on file  . Number of children: Not on file  . Years of education: Not on file  . Highest education level: Not on file  Social Needs  .  Financial resource strain: Not on file  . Food insecurity - worry: Not on file  . Food insecurity - inability: Not on file  . Transportation needs - medical: Not on file  . Transportation needs - non-medical: Not on file  Occupational History  . Not on file  Tobacco Use  . Smoking status: Current Every Day Smoker    Packs/day: 1.00    Types: Cigarettes  . Smokeless tobacco: Never Used  Substance and Sexual Activity  . Alcohol use: Yes    Alcohol/week: 0.0 oz    Comment: occ  . Drug use: No  . Sexual activity: Not on file  Other Topics Concern  . Not on file  Social History Narrative   Single.   No children.   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           Family History  Problem Relation Age of Onset  . Heart disease Mother   . COPD Father   . Hypertension Mother   . Hypertension Father   . Hyperlipidemia Brother   . Prostate cancer Paternal Uncle     Allergies  Allergen Reactions  . Penicillins Hives and Itching  . Sulfa Antibiotics     Medication list reviewed and updated in full in [place].  ROS: GEN: Acute illness details above GI: Tolerating PO intake, as above GU: maintaining adequate hydration  and urination Pulm: No SOB Interactive and getting along well at home.  Otherwise, ROS is as per the HPI.  Objective:   BP 100/60   Pulse 75   Temp 98.5 F (36.9 C) (Oral)   Ht 5\' 3"  (1.6 m)   Wt 201 lb 4 oz (91.3 kg)   LMP 03/21/2017   BMI 35.65 kg/m   GEN: WDWN, NAD, Non-toxic, A & O x 3 HEENT: Atraumatic, Normocephalic. Neck supple. No masses, No LAD. Ears and Nose: No external deformity. CV: RRR, No M/G/R. No JVD. No thrill. No extra heart sounds. PULM: CTA B, no wheezes, crackles, rhonchi. No retractions. No resp. distress. No accessory muscle use. ABD: S, RLQ pain, mild distension, hyperactive BS. No rebound. No HSM. EXTR: No c/c/e NEURO Normal gait.  PSYCH: Normally interactive. Conversant.  Not depressed or anxious appearing.  Calm demeanor.     Laboratory and Imaging Data:  Assessment and Plan:   Right lower quadrant pain - Plan: Basic metabolic panel, CBC with Differential/Platelet, Hepatic function panel, Lipase  Right lower quadrant abdominal pain - Plan: CT Abdomen Pelvis W Contrast  Hypogastric and right lower quadrant abdominal pain of unclear source. Obtain CT of the abdomen and pelvis with by mouth and IV contrast to evaluate for potential intra-abdominal pathology including diverticulitis, ovarian cyst, gallbladder disease, or other intra-abdominal pathology.  Follow-up: No Follow-up on file.  Orders Placed This Encounter  Procedures  . CT Abdomen Pelvis W Contrast  . Basic metabolic panel  . CBC with Differential/Platelet  . Hepatic function panel  . Lipase    Signed,  Kael Keetch T. Keni Elison, MD   Allergies as of 04/07/2017      Reactions   Penicillins Hives, Itching   Sulfa Antibiotics       Medication List        Accurate as of 04/07/17  2:02 PM. Always use your most recent med list.          acyclovir 400 MG tablet Commonly known as:  ZOVIRAX TAKE 1 TABLET BY MOUTH TWICE (2) DAILY   albuterol 108 (90 Base) MCG/ACT inhaler Commonly known as:  PROVENTIL HFA;VENTOLIN HFA Inhale 2 puffs into the lungs every 6 (six) hours as needed for wheezing or shortness of breath.   atorvastatin 20 MG tablet Commonly known as:  LIPITOR Take 1 tablet (20 mg total) by mouth every evening.   diclofenac 75 MG EC tablet Commonly known as:  VOLTAREN TAKE 1 TABLET BY MOUTH TWICE A DAY AS NEEDED FOR MODERATE PAIN.   fluticasone 50 MCG/ACT nasal spray Commonly known as:  FLONASE Place 1 spray into both nostrils 2 (two) times daily.   fluticasone furoate-vilanterol 100-25 MCG/INH Aepb Commonly known as:  BREO ELLIPTA Inhale 1 puff into the lungs daily.   hydrochlorothiazide 25 MG tablet Commonly known as:  HYDRODIURIL Take 1 tablet (25 mg total) by mouth  daily.   losartan 100 MG tablet Commonly known as:  COZAAR TAKE 1 TABLET BY MOUTH ONCE A DAY   metFORMIN 500 MG tablet Commonly known as:  GLUCOPHAGE TAKE 1 TABLET BY MOUTH 2 TIMES DAILY WITH A MEAL   omeprazole 20 MG capsule Commonly known as:  PRILOSEC TAKE 1 CAPSULE BY MOUTH ONCE DAILY

## 2017-04-07 NOTE — Patient Instructions (Signed)

## 2017-04-11 ENCOUNTER — Other Ambulatory Visit: Payer: Self-pay | Admitting: Primary Care

## 2017-04-11 DIAGNOSIS — E119 Type 2 diabetes mellitus without complications: Secondary | ICD-10-CM

## 2017-04-12 ENCOUNTER — Encounter: Payer: Self-pay | Admitting: Family Medicine

## 2017-04-13 ENCOUNTER — Ambulatory Visit: Admission: RE | Admit: 2017-04-13 | Payer: 59 | Source: Ambulatory Visit

## 2017-04-13 ENCOUNTER — Encounter: Payer: Self-pay | Admitting: *Deleted

## 2017-05-01 ENCOUNTER — Encounter: Payer: Self-pay | Admitting: Primary Care

## 2017-06-04 ENCOUNTER — Other Ambulatory Visit: Payer: Self-pay | Admitting: Primary Care

## 2017-06-04 DIAGNOSIS — K219 Gastro-esophageal reflux disease without esophagitis: Secondary | ICD-10-CM

## 2017-06-08 ENCOUNTER — Encounter: Payer: Self-pay | Admitting: Primary Care

## 2017-06-08 DIAGNOSIS — J4540 Moderate persistent asthma, uncomplicated: Secondary | ICD-10-CM

## 2017-06-08 NOTE — Telephone Encounter (Signed)
Please advise how these refills are handled? Send message back to [facility] in [facility].

## 2017-06-08 NOTE — Telephone Encounter (Signed)
FYI, do you remember how we did this last year? She gets a discount through [facility] for both inhalers.

## 2017-06-09 MED ORDER — ALBUTEROL SULFATE HFA 108 (90 BASE) MCG/ACT IN AERS: 2.0000 | INHALATION_SPRAY | RESPIRATORY_TRACT | 3 refills | Status: DC | PRN

## 2017-06-09 MED ORDER — FLUTICASONE FUROATE-VILANTEROL 100-25 MCG/INH IN AEPB: 1.0000 | INHALATION_SPRAY | Freq: Every day | RESPIRATORY_TRACT | 11 refills | Status: DC

## 2017-06-09 MED ORDER — ALBUTEROL SULFATE HFA 108 (90 BASE) MCG/ACT IN AERS: 2.0000 | INHALATION_SPRAY | RESPIRATORY_TRACT | 11 refills | Status: DC | PRN

## 2017-07-01 ENCOUNTER — Other Ambulatory Visit: Payer: Self-pay | Admitting: Primary Care

## 2017-07-01 ENCOUNTER — Encounter: Payer: Self-pay | Admitting: Primary Care

## 2017-07-01 DIAGNOSIS — I10 Essential (primary) hypertension: Secondary | ICD-10-CM

## 2017-07-05 ENCOUNTER — Other Ambulatory Visit: Payer: Self-pay | Admitting: Primary Care

## 2017-07-05 DIAGNOSIS — I10 Essential (primary) hypertension: Secondary | ICD-10-CM

## 2017-07-18 ENCOUNTER — Other Ambulatory Visit: Payer: Self-pay | Admitting: Primary Care

## 2017-07-18 DIAGNOSIS — E119 Type 2 diabetes mellitus without complications: Secondary | ICD-10-CM

## 2017-07-18 DIAGNOSIS — A6000 Herpesviral infection of urogenital system, unspecified: Secondary | ICD-10-CM

## 2017-09-12 ENCOUNTER — Encounter: Payer: Self-pay | Admitting: Primary Care

## 2017-10-19 ENCOUNTER — Telehealth: Payer: Self-pay | Admitting: Primary Care

## 2017-10-19 NOTE — Telephone Encounter (Signed)
I received notification that her losartan may be on the recall list. Can we verify with [facility]?

## 2017-10-20 ENCOUNTER — Other Ambulatory Visit: Payer: Self-pay | Admitting: Primary Care

## 2017-10-20 DIAGNOSIS — E119 Type 2 diabetes mellitus without complications: Secondary | ICD-10-CM

## 2017-10-20 NOTE — Telephone Encounter (Signed)
Spoken to the pharmacist at [facility] and stated that patient's losartan is not on the recall list.

## 2017-10-20 NOTE — Telephone Encounter (Signed)
Noted  

## 2017-11-21 ENCOUNTER — Other Ambulatory Visit: Payer: Self-pay | Admitting: Primary Care

## 2017-11-21 ENCOUNTER — Encounter: Payer: Self-pay | Admitting: Primary Care

## 2017-11-21 DIAGNOSIS — E119 Type 2 diabetes mellitus without complications: Secondary | ICD-10-CM

## 2017-11-21 DIAGNOSIS — A6000 Herpesviral infection of urogenital system, unspecified: Secondary | ICD-10-CM

## 2017-11-30 ENCOUNTER — Other Ambulatory Visit: Payer: Self-pay | Admitting: Primary Care

## 2017-11-30 DIAGNOSIS — I10 Essential (primary) hypertension: Secondary | ICD-10-CM

## 2017-12-04 ENCOUNTER — Encounter: Payer: Self-pay | Admitting: Primary Care

## 2017-12-08 ENCOUNTER — Encounter: Payer: Self-pay | Admitting: Primary Care

## 2017-12-09 ENCOUNTER — Other Ambulatory Visit: Payer: Self-pay | Admitting: Primary Care

## 2017-12-09 DIAGNOSIS — K219 Gastro-esophageal reflux disease without esophagitis: Secondary | ICD-10-CM

## 2017-12-17 ENCOUNTER — Encounter: Payer: Self-pay | Admitting: Primary Care

## 2017-12-21 ENCOUNTER — Other Ambulatory Visit: Payer: Self-pay | Admitting: Primary Care

## 2017-12-21 ENCOUNTER — Encounter: Payer: Self-pay | Admitting: Primary Care

## 2017-12-21 DIAGNOSIS — J4540 Moderate persistent asthma, uncomplicated: Secondary | ICD-10-CM

## 2017-12-21 DIAGNOSIS — E119 Type 2 diabetes mellitus without complications: Secondary | ICD-10-CM

## 2017-12-21 NOTE — Telephone Encounter (Addendum)
We gave this printed Rx on 06/18/2017 to [facility] but due to the delay on the application. Patient is asking need a refill send to pharmacy.  Last office visit on 03/22/2017

## 2017-12-21 NOTE — Telephone Encounter (Signed)
Noted, refill sent to pharmacy. 

## 2017-12-26 ENCOUNTER — Encounter: Payer: Self-pay | Admitting: Internal Medicine

## 2017-12-26 ENCOUNTER — Ambulatory Visit (INDEPENDENT_AMBULATORY_CARE_PROVIDER_SITE_OTHER): Payer: 59 | Admitting: Internal Medicine

## 2017-12-26 VITALS — BP 126/84 | HR 68 | Temp 98.7°F | Wt 201.0 lb

## 2017-12-26 DIAGNOSIS — M542 Cervicalgia: Secondary | ICD-10-CM

## 2017-12-26 MED ORDER — NAPROXEN 375 MG PO TBEC: 1.0000 | DELAYED_RELEASE_TABLET | Freq: Two times a day (BID) | ORAL | 0 refills | Status: DC

## 2017-12-26 MED ORDER — TIZANIDINE HCL 4 MG PO CAPS: 4.0000 mg | ORAL_CAPSULE | Freq: Every evening | ORAL | 0 refills | Status: DC | PRN

## 2017-12-26 NOTE — Progress Notes (Signed)
Subjective:    Patient ID: Alexandria Leblanc, female    DOB: [birth year], 47 y.o.   MRN: [id number]  HPI  Pt presents to the clinic today with c/o neck pain. She reports this started 1 week ago. The neck pain is intermittent. She describes the pain as achy, sharp, shooting with muscle spasms. She denies numbness, tingling or weakness in her arms. She denies any injury to the area. She has tried cold compresses and Ibuprofen as needed with minimal relief.  Review of Systems      Past Medical History:  Diagnosis Date  . Asthma   . Genital warts   . GERD (gastroesophageal reflux disease)   . Hypertension   . Migraines   . Type 2 diabetes mellitus ([place])     Current Outpatient Medications  Medication Sig Dispense Refill  . acyclovir (ZOVIRAX) 400 MG tablet Take 1 tablet (400 mg total) by mouth 2 (two) times daily. NEED APPOINTMENT FOR ANY MORE REFILLS 180 tablet 0  . atorvastatin (LIPITOR) 20 MG tablet Take 1 tablet (20 mg total) by mouth every evening. 90 tablet 3  . fluticasone (FLONASE) 50 MCG/ACT nasal spray Place 1 spray into both nostrils 2 (two) times daily. 16 g 3  . fluticasone furoate-vilanterol (BREO ELLIPTA) 100-25 MCG/INH AEPB Inhale 1 puff into the lungs daily. 1 each 11  . hydrochlorothiazide (HYDRODIURIL) 25 MG tablet TAKE 1 TABLET BY MOUTH ONCE A DAY 90 tablet 1  . losartan (COZAAR) 100 MG tablet TAKE 1 TABLET BY MOUTH ONCE A DAY 90 tablet 1  . metFORMIN (GLUCOPHAGE) 500 MG tablet Take 1 tablet (500 mg total) by mouth 2 (two) times daily with a meal. DUE FOR A FOLLOW UP APPOINTMENT 60 tablet 0  . omeprazole (PRILOSEC) 20 MG capsule Take 1 capsule (20 mg total) by mouth daily. NEED APPOINTMENT FOR ANY MORE REFILLS 90 capsule 0  . VENTOLIN HFA 108 (90 Base) MCG/ACT inhaler INHALE 2 PUFFS INTO THE LUNGS EVERY 6 HOURS AS NEEDED FOR WHEEZING ORSHORTNESS OF BREATH 18 g 0  . Naproxen 375 MG TBEC Take 1 tablet (375 mg total) by mouth 2 (two) times daily. 14 each 0  . tiZANidine  (ZANAFLEX) 4 MG capsule Take 1 capsule (4 mg total) by mouth at bedtime as needed for muscle spasms. 14 capsule 0   No current facility-administered medications for this visit.     Allergies  Allergen Reactions  . Penicillins Hives and Itching  . Sulfa Antibiotics     Family History  Problem Relation Age of Onset  . Heart disease Mother   . COPD Father   . Hypertension Mother   . Hypertension Father   . Hyperlipidemia Brother   . Prostate cancer Paternal Uncle     Social History   Socioeconomic History  . Marital status: Single    Spouse name: Not on file  . Number of children: Not on file  . Years of education: Not on file  . Highest education level: Not on file  Occupational History  . Not on file  Social Needs  . Financial resource strain: Not on file  . Food insecurity:    Worry: Not on file    Inability: Not on file  . Transportation needs:    Medical: Not on file    Non-medical: Not on file  Tobacco Use  . Smoking status: Current Every Day Smoker    Packs/day: 1.00    Types: Cigarettes  . Smokeless tobacco: Never Used  Substance and Sexual Activity  . Alcohol use: Yes    Alcohol/week: 0.0 oz    Comment: occ  . Drug use: No  . Sexual activity: Not on file  Lifestyle  . Physical activity:    Days per week: Not on file    Minutes per session: Not on file  . Stress: Not on file  Relationships  . Social connections:    Talks on phone: Not on file    Gets together: Not on file    Attends religious service: Not on file    Active member of club or organization: Not on file    Attends meetings of clubs or organizations: Not on file    Relationship status: Not on file  . Intimate partner violence:    Fear of current or ex partner: Not on file    Emotionally abused: Not on file    Physically abused: Not on file    Forced sexual activity: Not on file  Other Topics Concern  . Not on file  Social History Narrative   Single.   No children.   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   Highest level of education GED.            Constitutional: Denies fever, malaise, fatigue, headache or abrupt weight changes.  Musculoskeletal: Pt reports neck pain, muscle spasms. Denies decrease in range of motion, difficulty with gait, or joint pain and swelling.  Skin: Denies redness, rashes, lesions or ulcercations.  Neurological: Denies dizziness, difficulty with memory, difficulty with speech or problems with coordination.   No other specific complaints in a complete review of systems (except as listed in HPI above).  Objective:   Physical Exam   BP 126/84   Pulse 68   Temp 98.7 F (37.1 C) (Oral)   Wt 201 lb (91.2 kg)   LMP 11/12/2017 Comment: irregular  SpO2 97%   BMI 35.61 kg/m  Wt Readings from Last 3 Encounters:  12/26/17 201 lb (91.2 kg)  04/07/17 201 lb 4 oz (91.3 kg)  03/22/17 204 lb 12.8 oz (92.9 kg)    General: Appears her stated age, obese, in NAD. Musculoskeletal: Normal flexion, extension, rotation and lateral bending of the cervical spine. No bony tenderness noted over the spine. Pain with palpation of the left paracervical muscles and trapezius. Strength 5/5 BUE/BLE.  Neurological: Alert and oriented. Sensation intact to BUE.   BMET    Component Value Date/Time   NA 134 (L) 04/07/2017 1230   K 3.6 04/07/2017 1230   CL 98 04/07/2017 1230   CO2 29 04/07/2017 1230   GLUCOSE 128 (H) 04/07/2017 1230   BUN 9 04/07/2017 1230   CREATININE 0.83 04/07/2017 1230   CALCIUM 9.3 04/07/2017 1230   GFRNONAA >60 09/12/2016 1230   GFRAA >60 09/12/2016 1230    Lipid Panel     Component Value Date/Time   CHOL 176 03/22/2017 0943   TRIG 178.0 (H) 03/22/2017 0943   HDL 42.60 03/22/2017 0943   CHOLHDL 4 03/22/2017 0943   VLDL 35.6 03/22/2017 0943   LDLCALC 98 03/22/2017 0943    CBC    Component Value Date/Time   WBC 10.4 04/07/2017 1230   RBC 4.51 04/07/2017 1230   HGB 14.4 04/07/2017 1230   HCT 42.6 04/07/2017 1230     PLT 158.0 04/07/2017 1230   MCV 94.5 04/07/2017 1230   MCH 31.5 09/12/2016 1230   MCHC 33.9 04/07/2017 1230   RDW 13.1 04/07/2017 1230  LYMPHSABS 3.0 04/07/2017 1230   MONOABS 0.8 04/07/2017 1230   EOSABS 0.2 04/07/2017 1230   BASOSABS 0.0 04/07/2017 1230    Hgb A1C Lab Results  Component Value Date   HGBA1C 6.4 03/22/2017           Assessment & Plan:   Acute Neck Pain:  Likely muscular strain No indication for xray at this time Encouraged heat instead of ice Massage may be helpful eRx for Naproxen 375 mg BID x 7 days- avoid NSAID's OTC, can take Tylenol if needed eRx for Zanaflex 4 mg QHS prn- sedation caution given Neck exercises given Work note provided  RTC as needed or if symptoms persist or worsen Webb Silversmith, NP

## 2017-12-26 NOTE — Patient Instructions (Signed)
Neck Exercises Neck exercises can be important for many reasons:  They can help you to improve and maintain flexibility in your neck. This can be especially important as you age.  They can help to make your neck stronger. This can make movement easier.  They can reduce or prevent neck pain.  They may help your upper back.  Ask your health care provider which neck exercises would be best for you. Exercises Neck Press Repeat this exercise 10 times. Do it first thing in the morning and right before bed or as told by your health care provider. 1. Lie on your back on a firm bed or on the floor with a pillow under your head. 2. Use your neck muscles to push your head down on the pillow and straighten your spine. 3. Hold the position as well as you can. Keep your head facing up and your chin tucked. 4. Slowly count to 5 while holding this position. 5. Relax for a few seconds. Then repeat.  Isometric Strengthening Do a full set of these exercises 2 times a day or as told by your health care provider. 1. Sit in a supportive chair and place your hand on your forehead. 2. Push forward with your head and neck while pushing back with your hand. Hold for 10 seconds. 3. Relax. Then repeat the exercise 3 times. 4. Next, do thesequence again, this time putting your hand against the back of your head. Use your head and neck to push backward against the hand pressure. 5. Finally, do the same exercise on either side of your head, pushing sideways against the pressure of your hand.  Prone Head Lifts Repeat this exercise 5 times. Do this 2 times a day or as told by your health care provider. 1. Lie face-down, resting on your elbows so that your chest and upper back are raised. 2. Start with your head facing downward, near your chest. Position your chin either on or near your chest. 3. Slowly lift your head upward. Lift until you are looking straight ahead. Then continue lifting your head as far back as  you can stretch. 4. Hold your head up for 5 seconds. Then slowly lower it to your starting position.  Supine Head Lifts Repeat this exercise 8-10 times. Do this 2 times a day or as told by your health care provider. 1. Lie on your back, bending your knees to point to the ceiling and keeping your feet flat on the floor. 2. Lift your head slowly off the floor, raising your chin toward your chest. 3. Hold for 5 seconds. 4. Relax and repeat.  Scapular Retraction Repeat this exercise 5 times. Do this 2 times a day or as told by your health care provider. 1. Stand with your arms at your sides. Look straight ahead. 2. Slowly pull both shoulders backward and downward until you feel a stretch between your shoulder blades in your upper back. 3. Hold for 10-30 seconds. 4. Relax and repeat.  Contact a health care provider if:  Your neck pain or discomfort gets much worse when you do an exercise.  Your neck pain or discomfort does not improve within 2 hours after you exercise. If you have any of these problems, stop exercising right away. Do not do the exercises again unless your health care provider says that you can. Get help right away if:  You develop sudden, severe neck pain. If this happens, stop exercising right away. Do not do the exercises again unless your   health care provider says that you can. Exercises Neck Stretch  Repeat this exercise 3-5 times. 1. Do this exercise while standing or while sitting in a chair. 2. Place your feet flat on the floor, shoulder-width apart. 3. Slowly turn your head to the right. Turn it all the way to the right so you can look over your right shoulder. Do not tilt or tip your head. 4. Hold this position for 10-30 seconds. 5. Slowly turn your head to the left, to look over your left shoulder. 6. Hold this position for 10-30 seconds.  Neck Retraction Repeat this exercise 8-10 times. Do this 3-4 times a day or as told by your health care  provider. 1. Do this exercise while standing or while sitting in a sturdy chair. 2. Look straight ahead. Do not bend your neck. 3. Use your fingers to push your chin backward. Do not bend your neck for this movement. Continue to face straight ahead. If you are doing the exercise properly, you will feel a slight sensation in your throat and a stretch at the back of your neck. 4. Hold the stretch for 1-2 seconds. Relax and repeat.  This information is not intended to replace advice given to you by your health care provider. Make sure you discuss any questions you have with your health care provider. Document Released: 04/28/2015 Document Revised: 10/23/2015 Document Reviewed: 11/25/2014 Elsevier Interactive Patient Education  2018 [employer] Inc[employer]  

## 2018-01-17 ENCOUNTER — Other Ambulatory Visit: Payer: Self-pay | Admitting: Primary Care

## 2018-01-17 DIAGNOSIS — E119 Type 2 diabetes mellitus without complications: Secondary | ICD-10-CM

## 2018-01-20 ENCOUNTER — Other Ambulatory Visit: Payer: Self-pay | Admitting: Primary Care

## 2018-01-20 DIAGNOSIS — J4540 Moderate persistent asthma, uncomplicated: Secondary | ICD-10-CM

## 2018-01-20 NOTE — Telephone Encounter (Signed)
Copied from [place] [id number]. Topic: Quick Communication - Rx Refill/Question >> Jan 20, 2018  9:41 AM Sheran Luz wrote: Medication: VENTOLIN HFA 108 (90 Base) MCG/ACT inhaler [[id number]]   Pt states that she gets her medication through [facility]. Pt is requesting it be faxed with a cover sheet to [phone], qty 90 days. Pt stated that is it okay to contact her through [facility].

## 2018-01-23 ENCOUNTER — Other Ambulatory Visit: Payer: Self-pay | Admitting: Primary Care

## 2018-01-23 DIAGNOSIS — E119 Type 2 diabetes mellitus without complications: Secondary | ICD-10-CM

## 2018-01-24 NOTE — Telephone Encounter (Signed)
Last prescribed on 12/21/2017 Last office visit on 12/26/2017 with Rollene Fare for an acute

## 2018-01-24 NOTE — Telephone Encounter (Signed)
Approved, please print and send.

## 2018-01-24 NOTE — Telephone Encounter (Signed)
Overdue for follow up, please schedule. One month refill provided. Same for albuterol inhaler please.

## 2018-01-25 MED ORDER — ALBUTEROL SULFATE HFA 108 (90 BASE) MCG/ACT IN AERS: INHALATION_SPRAY | RESPIRATORY_TRACT | 0 refills | Status: DC

## 2018-01-25 NOTE — Telephone Encounter (Signed)
Printed. Send patient a message thru [facility]

## 2018-01-26 NOTE — Telephone Encounter (Addendum)
Send patient a message regarding the inhaler. Patient was notified that she is due for a follow up.

## 2018-02-22 ENCOUNTER — Other Ambulatory Visit: Payer: Self-pay | Admitting: Primary Care

## 2018-02-22 DIAGNOSIS — E119 Type 2 diabetes mellitus without complications: Secondary | ICD-10-CM

## 2018-02-24 ENCOUNTER — Other Ambulatory Visit: Payer: Self-pay | Admitting: Primary Care

## 2018-02-24 DIAGNOSIS — A6000 Herpesviral infection of urogenital system, unspecified: Secondary | ICD-10-CM

## 2018-02-27 NOTE — Telephone Encounter (Signed)
Last prescribed on 11/22/2017 Last office visit on 12/26/2017 with Rollene Fare for an acute

## 2018-02-28 NOTE — Telephone Encounter (Signed)
Declined refill. She needs a follow up visit with me, please schedule.

## 2018-03-03 NOTE — Telephone Encounter (Signed)
Message left for patient to return my call.  

## 2018-03-07 NOTE — Telephone Encounter (Signed)
Sent patient a message through My Chart.

## 2018-03-13 ENCOUNTER — Encounter: Payer: Self-pay | Admitting: Primary Care

## 2018-03-13 ENCOUNTER — Ambulatory Visit (INDEPENDENT_AMBULATORY_CARE_PROVIDER_SITE_OTHER): Payer: 59 | Admitting: Primary Care

## 2018-03-13 VITALS — BP 116/68 | HR 72 | Temp 98.9°F | Ht 63.0 in | Wt 197.0 lb

## 2018-03-13 DIAGNOSIS — G8929 Other chronic pain: Secondary | ICD-10-CM

## 2018-03-13 DIAGNOSIS — I10 Essential (primary) hypertension: Secondary | ICD-10-CM

## 2018-03-13 DIAGNOSIS — K219 Gastro-esophageal reflux disease without esophagitis: Secondary | ICD-10-CM | POA: Diagnosis not present

## 2018-03-13 DIAGNOSIS — E785 Hyperlipidemia, unspecified: Secondary | ICD-10-CM

## 2018-03-13 DIAGNOSIS — M5441 Lumbago with sciatica, right side: Secondary | ICD-10-CM

## 2018-03-13 DIAGNOSIS — J4540 Moderate persistent asthma, uncomplicated: Secondary | ICD-10-CM

## 2018-03-13 DIAGNOSIS — E119 Type 2 diabetes mellitus without complications: Secondary | ICD-10-CM | POA: Diagnosis not present

## 2018-03-13 DIAGNOSIS — A6000 Herpesviral infection of urogenital system, unspecified: Secondary | ICD-10-CM

## 2018-03-13 DIAGNOSIS — M5442 Lumbago with sciatica, left side: Secondary | ICD-10-CM

## 2018-03-13 LAB — COMPREHENSIVE METABOLIC PANEL
ALK PHOS: 151 U/L — AB (ref 39–117)
ALT: 55 U/L — ABNORMAL HIGH (ref 0–35)
AST: 38 U/L — AB (ref 0–37)
Albumin: 3.8 g/dL (ref 3.5–5.2)
BUN: 14 mg/dL (ref 6–23)
CALCIUM: 9.3 mg/dL (ref 8.4–10.5)
CO2: 30 mEq/L (ref 19–32)
CREATININE: 0.6 mg/dL (ref 0.40–1.20)
Chloride: 97 mEq/L (ref 96–112)
GFR: 113.58 mL/min (ref >60.00)
Glucose, Bld: 326 mg/dL — ABNORMAL HIGH (ref 70–99)
Potassium: 3.8 mEq/L (ref 3.5–5.1)
SODIUM: 132 meq/L — AB (ref 135–145)
Total Bilirubin: 0.5 mg/dL (ref 0.2–1.2)
Total Protein: 7.5 g/dL (ref 6.0–8.3)

## 2018-03-13 LAB — LIPID PANEL
Cholesterol: 182 mg/dL (ref 0–200)
HDL: 27.8 mg/dL — AB (ref >39.00)
Total CHOL/HDL Ratio: 7

## 2018-03-13 LAB — LDL CHOLESTEROL, DIRECT: Direct LDL: 44 mg/dL

## 2018-03-13 LAB — HEMOGLOBIN A1C: Hgb A1c MFr Bld: 10.3 % — ABNORMAL HIGH (ref 4.6–6.5)

## 2018-03-13 MED ORDER — HYDROCHLOROTHIAZIDE 25 MG PO TABS: ORAL_TABLET | ORAL | 3 refills | Status: DC

## 2018-03-13 MED ORDER — OMEPRAZOLE 20 MG PO CPDR: DELAYED_RELEASE_CAPSULE | ORAL | 3 refills | Status: DC

## 2018-03-13 NOTE — Progress Notes (Signed)
Subjective:    Patient ID: Alexandria Leblanc, female    DOB: Feb 26, 1971, 47 y.o.   MRN: [id number]  HPI  Alexandria Leblanc is a 47 year old female who presents today for follow up.  1) Type 2 Diabetes:  Current medications include: Metformin 500 mg BID  Last A1C: 6.4 in October 2018 Last Eye Exam: No recent exam.  Last Foot Exam: Due  Pneumonia Vaccination: Completed in 2017 ACE/ARB: Losartan  Statin: Atorvastatin   Diet currently consists of:  Breakfast: Eggs, sausage, bacon Lunch: Skips Dinner: Meat, tacos, salad, potatoes, restaurant/take out food Snacks: Crackers, pretzels, avocado  Desserts: Occasional ice cream  Beverages: Diet soda, water  Exercise: She is not exercising   2) Essential Hypertension: Currently managed on losartan 100 mg and HCTZ 25 mg. She denies chest pain, dizziness.   BP Readings from Last 3 Encounters:  03/13/18 116/68  12/26/17 126/84  04/07/17 100/60    3) Asthma: Currently managed on Breo Ellipta daily and albuterol HFA. She is using her Breo every other day and albuterol inhaler daily. She continues to smoke 1.5 packs of cigarettes daily. She does experience exertional shortness of breath and wheezing.  4) GERD: Currently managed on omeprazole 20 mg. Overall feels well managed, tries to avoid trigger foods. She is needing a refill today.  5) Genital Herpes: Currently managed on acyclovir 400 mg BID PRN. She denies recent herpes outbreaks.   Review of Systems  Respiratory:       Intermittent exertional SOB and wheezing  Cardiovascular: Negative for chest pain.  Gastrointestinal:       GERD  Genitourinary:       Denies herpes outbreaks  Neurological:       Intermittent burning and tingling to plantar feet       Past Medical History:  Diagnosis Date  . Asthma   . Genital warts   . GERD (gastroesophageal reflux disease)   . Hypertension   . Migraines   . Type 2 diabetes mellitus ([place])      Social History   Socioeconomic  History  . Marital status: Single    Spouse name: Not on file  . Number of children: Not on file  . Years of education: Not on file  . Highest education level: Not on file  Occupational History  . Not on file  Social Needs  . Financial resource strain: Not on file  . Food insecurity:    Worry: Not on file    Inability: Not on file  . Transportation needs:    Medical: Not on file    Non-medical: Not on file  Tobacco Use  . Smoking status: Current Every Day Smoker    Packs/day: 1.00    Types: Cigarettes  . Smokeless tobacco: Never Used  Substance and Sexual Activity  . Alcohol use: Yes    Alcohol/week: 0.0 standard drinks    Comment: occ  . Drug use: No  . Sexual activity: Not on file  Lifestyle  . Physical activity:    Days per week: Not on file    Minutes per session: Not on file  . Stress: Not on file  Relationships  . Social connections:    Talks on phone: Not on file    Gets together: Not on file    Attends religious service: Not on file    Active member of club or organization: Not on file    Attends meetings of clubs or organizations: Not on file  Relationship status: Not on file  . Intimate partner violence:    Fear of current or ex partner: Not on file    Emotionally abused: Not on file    Physically abused: Not on file    Forced sexual activity: Not on file  Other Topics Concern  . Not on file  Social History Narrative   Single.   No children.   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           No past surgical history on file.  Family History  Problem Relation Age of Onset  . Heart disease Mother   . COPD Father   . Hypertension Mother   . Hypertension Father   . Hyperlipidemia Brother   . Prostate cancer Paternal Uncle     Allergies  Allergen Reactions  . Penicillins Hives and Itching  . Sulfa Antibiotics     Current Outpatient Medications on File Prior to Visit  Medication Sig Dispense Refill  .  acyclovir (ZOVIRAX) 400 MG tablet Take 1 tablet (400 mg total) by mouth 2 (two) times daily. NEED APPOINTMENT FOR ANY MORE REFILLS 180 tablet 0  . albuterol (VENTOLIN HFA) 108 (90 Base) MCG/ACT inhaler INHALE 2 PUFFS INTO THE LUNGS EVERY 6 HOURS AS NEEDED FOR WHEEZING ORSHORTNESS OF BREATH 54 g 0  . atorvastatin (LIPITOR) 20 MG tablet Take 1 tablet (20 mg total) by mouth every evening. 90 tablet 3  . fluticasone (FLONASE) 50 MCG/ACT nasal spray Place 1 spray into both nostrils 2 (two) times daily. 16 g 3  . fluticasone furoate-vilanterol (BREO ELLIPTA) 100-25 MCG/INH AEPB Inhale 1 puff into the lungs daily. 1 each 11  . losartan (COZAAR) 100 MG tablet TAKE 1 TABLET BY MOUTH ONCE A DAY 90 tablet 1  . metFORMIN (GLUCOPHAGE) 500 MG tablet TAKE 1 TABLET BY MOUTH TWICE A DAY WITH A MEAL 60 tablet 0  . Naproxen 375 MG TBEC Take 1 tablet (375 mg total) by mouth 2 (two) times daily. 14 each 0   No current facility-administered medications on file prior to visit.     BP 116/68   Pulse 72   Temp 98.9 F (37.2 C) (Oral)   Ht 5\' 3"  (1.6 m)   Wt 197 lb (89.4 kg)   LMP 11/11/2017   SpO2 97%   BMI 34.90 kg/m    Objective:   Physical Exam  Constitutional: She appears well-nourished.  Neck: Neck supple.  Cardiovascular: Normal rate and regular rhythm.  Respiratory: Effort normal. She has wheezes in the right upper field and the left upper field.  Skin: Skin is warm and dry.           Assessment & Plan:

## 2018-03-13 NOTE — Assessment & Plan Note (Signed)
Stable on omeprazole, continue same. Refill sent pharmacy.

## 2018-03-13 NOTE — Assessment & Plan Note (Signed)
Uncontrolled and is not using LABA/ICS as prescribed, also using albuterol inappropriately. Discussed to use Breo daily, use albuterol PRN. Also strongly advise she work on quitting smoking.

## 2018-03-13 NOTE — Patient Instructions (Addendum)
Stop by the lab prior to leaving today. I will notify you of your results once received.   Use your Breo inhaler daily for asthma. Use the albuterol inhaler as needed for wheezing, shortness of breath.   It is important that you improve your diet. Please limit carbohydrates in the form of white bread, rice, pasta, sweets, fast food, fried food, sugary drinks, etc. Increase your consumption of fresh fruits and vegetables, whole grains, lean protein.  Ensure you are consuming 64 ounces of water daily.  Start exercising. You should be getting 150 minutes of exercise weekly.  Please schedule a follow up appointment in 6 months.   It was a pleasure to see you today!

## 2018-03-13 NOTE — Assessment & Plan Note (Signed)
Repeat A1C pending.  Managed on ARB and statin.  Discussed to work on diet and exercise. Foot exam today.  Referral placed for eye exam. Continue Metformin, may need to consider additional medication if A1C above goal.   Follow up in 6 months.

## 2018-03-13 NOTE — Assessment & Plan Note (Signed)
Overall stable.   

## 2018-03-13 NOTE — Assessment & Plan Note (Signed)
Stable in the office today, continue losartan and HCTZ. BMP pending.

## 2018-03-13 NOTE — Assessment & Plan Note (Signed)
No recent outbreaks. 

## 2018-03-13 NOTE — Assessment & Plan Note (Signed)
Repeat lipid panel pending. Continue atorvastatin.  

## 2018-03-14 ENCOUNTER — Other Ambulatory Visit: Payer: Self-pay | Admitting: Primary Care

## 2018-03-14 DIAGNOSIS — E119 Type 2 diabetes mellitus without complications: Secondary | ICD-10-CM

## 2018-03-14 MED ORDER — METFORMIN HCL 1000 MG PO TABS: 1000.0000 mg | ORAL_TABLET | Freq: Two times a day (BID) | ORAL | 1 refills | Status: DC

## 2018-03-17 ENCOUNTER — Other Ambulatory Visit: Payer: Self-pay | Admitting: Primary Care

## 2018-03-17 MED ORDER — GLIPIZIDE 5 MG PO TABS: 5.0000 mg | ORAL_TABLET | Freq: Two times a day (BID) | ORAL | 2 refills | Status: DC

## 2018-03-24 ENCOUNTER — Encounter: Payer: Self-pay | Admitting: Primary Care

## 2018-03-24 ENCOUNTER — Other Ambulatory Visit: Payer: Self-pay | Admitting: Primary Care

## 2018-03-24 DIAGNOSIS — E785 Hyperlipidemia, unspecified: Secondary | ICD-10-CM

## 2018-03-24 DIAGNOSIS — A6000 Herpesviral infection of urogenital system, unspecified: Secondary | ICD-10-CM

## 2018-03-24 LAB — HM DIABETES EYE EXAM

## 2018-04-24 ENCOUNTER — Telehealth: Payer: Self-pay | Admitting: Primary Care

## 2018-04-24 DIAGNOSIS — A6000 Herpesviral infection of urogenital system, unspecified: Secondary | ICD-10-CM

## 2018-04-25 ENCOUNTER — Other Ambulatory Visit: Payer: Self-pay | Admitting: Primary Care

## 2018-04-25 DIAGNOSIS — A6000 Herpesviral infection of urogenital system, unspecified: Secondary | ICD-10-CM

## 2018-04-25 MED ORDER — ACYCLOVIR 400 MG PO TABS: 400.0000 mg | ORAL_TABLET | Freq: Two times a day (BID) | ORAL | 1 refills | Status: DC

## 2018-04-25 NOTE — Telephone Encounter (Signed)
Please call patient:  I am happy to help her but based on our last conversation I have a few clarifying questions. How often is she taking the acyclovir? How often is she experiencing outbreaks? Based on her last office visit she says she was taking this as needed and we provided her with 60 tablets in late October 2019.  If she is taking this as needed and she should not be out of pills.

## 2018-04-25 NOTE — Telephone Encounter (Signed)
Last prescribed on 03/24/2018  Last office visit on 03/13/2018

## 2018-04-25 NOTE — Telephone Encounter (Signed)
Patient is using acyclovir on an as-needed basis, she should not be due for refill at this time.

## 2018-04-25 NOTE — Telephone Encounter (Signed)
Pt called office stating she is out of the Acyclovir. I did speak with Alexandria Leblanc and was given the okay to send to [facility]. Please send refill request to [facility].

## 2018-04-25 NOTE — Telephone Encounter (Signed)
Noted, refill sent to pharmacy. 

## 2018-04-25 NOTE — Telephone Encounter (Signed)
Spoken to patient. She stated that she is taking this 1 tablet BID. She has not have an outbreak since the last refill. She stated when she didn't get the refill on time, she would start to have pain and discomfort in the genial area.

## 2018-05-18 ENCOUNTER — Other Ambulatory Visit: Payer: Self-pay | Admitting: Primary Care

## 2018-05-18 DIAGNOSIS — I10 Essential (primary) hypertension: Secondary | ICD-10-CM

## 2018-06-16 ENCOUNTER — Ambulatory Visit (INDEPENDENT_AMBULATORY_CARE_PROVIDER_SITE_OTHER): Payer: 59 | Admitting: Primary Care

## 2018-06-16 ENCOUNTER — Encounter: Payer: Self-pay | Admitting: Primary Care

## 2018-06-16 VITALS — BP 124/78 | HR 76 | Temp 98.5°F | Ht 63.0 in | Wt 198.5 lb

## 2018-06-16 DIAGNOSIS — E119 Type 2 diabetes mellitus without complications: Secondary | ICD-10-CM

## 2018-06-16 LAB — POCT GLYCOSYLATED HEMOGLOBIN (HGB A1C): Hemoglobin A1C: 6.3 % — AB (ref 4.0–5.6)

## 2018-06-16 MED ORDER — GLIPIZIDE ER 5 MG PO TB24: 5.0000 mg | ORAL_TABLET | Freq: Every day | ORAL | 1 refills | Status: DC

## 2018-06-16 NOTE — Patient Instructions (Addendum)
Continue Metformin 1000 mg twice daily.  Start glipizide ER 5 mg. Take 1 tablet once daily.  Continue to monitor your glucose readings as discussed.  It is important that you improve your diet. Please limit carbohydrates in the form of white bread, rice, pasta, sweets, fast food, fried food, sugary drinks, etc. Increase your consumption of fresh fruits and vegetables, whole grains, lean protein.  Ensure you are consuming 64 ounces of water daily.  Please schedule a physical with me in 6 months. You may also schedule a lab only appointment 3-4 days prior. We will discuss your lab results in detail during your physical.  It was a pleasure to see you today!

## 2018-06-16 NOTE — Progress Notes (Signed)
Subjective:    Patient ID: Alexandria Leblanc, female    DOB: 06-03-1970, 48 y.o.   MRN: [id number]  HPI  Alexandria Leblanc is a 48 year old female who presents today for follow up of diabetes.  Current medications include: Metformin 1000 mg BID, Glipizide 5 mg BID  She is checking her blood glucose 0-1 times daily and is getting readings of: AM fasting: 120-140  Highest reading: 200 Lowest reading: 98  Last A1C: 10.3 in October 2019 Last Eye Exam: Completed in October 2019 Last Foot Exam: Completed in November 2019 Pneumonia Vaccination: Completed in 2017 ACE/ARB: losartan  Statin: atorvastatin   Diet currently consists of:  Breakfast: Grits, fast food, sometimes skips Lunch: Sandwich, some fast food Dinner: Take out, fast food, pasta, vegetables, meat Snacks: None Desserts: Once weekly  Beverages: Diet soda, water  Exercise: She is not exercising.   BP Readings from Last 3 Encounters:  06/16/18 124/78  03/13/18 116/68  12/26/17 126/84    Review of Systems  Respiratory: Negative for shortness of breath.   Cardiovascular: Negative for chest pain.  Gastrointestinal:       Some GI upset   Neurological: Negative for dizziness and headaches.       Denies numbness/tingling to feet.       Past Medical History:  Diagnosis Date  . Asthma   . Genital warts   . GERD (gastroesophageal reflux disease)   . Hypertension   . Migraines   . Type 2 diabetes mellitus ([place])      Social History   Socioeconomic History  . Marital status: Single    Spouse name: Not on file  . Number of children: Not on file  . Years of education: Not on file  . Highest education level: Not on file  Occupational History  . Not on file  Social Needs  . Financial resource strain: Not on file  . Food insecurity:    Worry: Not on file    Inability: Not on file  . Transportation needs:    Medical: Not on file    Non-medical: Not on file  Tobacco Use  . Smoking status: Current Every Day  Smoker    Packs/day: 1.00    Types: Cigarettes  . Smokeless tobacco: Never Used  Substance and Sexual Activity  . Alcohol use: Yes    Alcohol/week: 0.0 standard drinks    Comment: occ  . Drug use: No  . Sexual activity: Not on file  Lifestyle  . Physical activity:    Days per week: Not on file    Minutes per session: Not on file  . Stress: Not on file  Relationships  . Social connections:    Talks on phone: Not on file    Gets together: Not on file    Attends religious service: Not on file    Active member of club or organization: Not on file    Attends meetings of clubs or organizations: Not on file    Relationship status: Not on file  . Intimate partner violence:    Fear of current or ex partner: Not on file    Emotionally abused: Not on file    Physically abused: Not on file    Forced sexual activity: Not on file  Other Topics Concern  . Not on file  Social History Narrative   Single.   No children.   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  No past surgical history on file.  Family History  Problem Relation Age of Onset  . Heart disease Mother   . Hypertension Mother   . COPD Father   . Hypertension Father   . Hyperlipidemia Brother   . Prostate cancer Paternal Uncle     Allergies  Allergen Reactions  . Penicillins Hives and Itching  . Sulfa Antibiotics     Current Outpatient Medications on File Prior to Visit  Medication Sig Dispense Refill  . acyclovir (ZOVIRAX) 400 MG tablet Take 1 tablet (400 mg total) by mouth 2 (two) times daily. 180 tablet 1  . albuterol (VENTOLIN HFA) 108 (90 Base) MCG/ACT inhaler INHALE 2 PUFFS INTO THE LUNGS EVERY 6 HOURS AS NEEDED FOR WHEEZING ORSHORTNESS OF BREATH 54 g 0  . atorvastatin (LIPITOR) 20 MG tablet TAKE 1 TABLET BY MOUTH ONCE EVERY EVENING 90 tablet 1  . fluticasone (FLONASE) 50 MCG/ACT nasal spray Place 1 spray into both nostrils 2 (two) times daily. 16 g 3  .  fluticasone furoate-vilanterol (BREO ELLIPTA) 100-25 MCG/INH AEPB Inhale 1 puff into the lungs daily. 1 each 11  . glipiZIDE (GLUCOTROL) 5 MG tablet Take 1 tablet (5 mg total) by mouth 2 (two) times daily before a meal. 60 tablet 2  . hydrochlorothiazide (HYDRODIURIL) 25 MG tablet Take 1 tablet by mouth once daily for blood pressure. 90 tablet 3  . losartan (COZAAR) 100 MG tablet TAKE 1 TABLET BY MOUTH ONCE DAILY 90 tablet 1  . metFORMIN (GLUCOPHAGE) 1000 MG tablet Take 1 tablet (1,000 mg total) by mouth 2 (two) times daily with a meal. For diabetes. 180 tablet 1  . Naproxen 375 MG TBEC Take 1 tablet (375 mg total) by mouth 2 (two) times daily. 14 each 0  . omeprazole (PRILOSEC) 20 MG capsule Take 1 tablet by mouth once daily for heartburn. 90 capsule 3   No current facility-administered medications on file prior to visit.     BP 124/78   Pulse 76   Temp 98.5 F (36.9 C) (Oral)   Ht 5\' 3"  (1.6 m)   Wt 198 lb 8 oz (90 kg)   SpO2 98%   BMI 35.16 kg/m    Objective:   Physical Exam  Constitutional: She appears well-nourished.  Neck: Neck supple.  Cardiovascular: Normal rate and regular rhythm.  Respiratory: Effort normal.  Skin: Skin is warm and dry.  Psychiatric: She has a normal mood and affect.           Assessment & Plan:

## 2018-06-16 NOTE — Assessment & Plan Note (Signed)
Improved. A1C down to 6.3. GI upset since starting Glipizide, will change to ER version. Foot and eye exam UTD. Pneumonia vaccination UTD. Managed on ARB and statin.  Follow up in 6 months for re-evaluation.

## 2018-07-07 DIAGNOSIS — J4540 Moderate persistent asthma, uncomplicated: Secondary | ICD-10-CM

## 2018-07-11 MED ORDER — ALBUTEROL SULFATE HFA 108 (90 BASE) MCG/ACT IN AERS: INHALATION_SPRAY | RESPIRATORY_TRACT | 0 refills | Status: DC

## 2018-07-11 NOTE — Telephone Encounter (Signed)
See message below regarding a refill on her Ventolin inhaler.  Please provide her with 1 inhaler, no refills. It appears that she may be due for an update of her patient assistance program.

## 2018-07-12 MED ORDER — ALBUTEROL SULFATE HFA 108 (90 BASE) MCG/ACT IN AERS: INHALATION_SPRAY | RESPIRATORY_TRACT | 0 refills | Status: DC

## 2018-09-06 ENCOUNTER — Other Ambulatory Visit: Payer: Self-pay | Admitting: Primary Care

## 2018-09-06 DIAGNOSIS — E785 Hyperlipidemia, unspecified: Secondary | ICD-10-CM

## 2018-09-12 ENCOUNTER — Ambulatory Visit: Payer: Self-pay | Admitting: Primary Care

## 2018-09-18 ENCOUNTER — Other Ambulatory Visit: Payer: Self-pay | Admitting: Primary Care

## 2018-09-18 DIAGNOSIS — E119 Type 2 diabetes mellitus without complications: Secondary | ICD-10-CM

## 2018-10-24 ENCOUNTER — Other Ambulatory Visit: Payer: Self-pay | Admitting: Primary Care

## 2018-10-24 DIAGNOSIS — A6000 Herpesviral infection of urogenital system, unspecified: Secondary | ICD-10-CM

## 2018-10-24 NOTE — Telephone Encounter (Signed)
Error

## 2018-10-31 ENCOUNTER — Other Ambulatory Visit: Payer: Self-pay | Admitting: Primary Care

## 2018-10-31 DIAGNOSIS — I10 Essential (primary) hypertension: Secondary | ICD-10-CM

## 2018-11-10 DIAGNOSIS — J4540 Moderate persistent asthma, uncomplicated: Secondary | ICD-10-CM

## 2018-11-10 NOTE — Telephone Encounter (Signed)
Alexandria Mare, do we do anything special for her Breo refill? Is this covered by the manufacture?

## 2018-11-13 MED ORDER — FLUTICASONE FUROATE-VILANTEROL 100-25 MCG/INH IN AEPB: 1.0000 | INHALATION_SPRAY | Freq: Every day | RESPIRATORY_TRACT | 11 refills | Status: DC

## 2018-11-13 NOTE — Telephone Encounter (Signed)
You will need to print the Rx and I will fax it. To [place]. I believe the application was done fall of last year

## 2018-11-13 NOTE — Telephone Encounter (Signed)
Printed and signed. Placed in [place].

## 2018-11-14 NOTE — Telephone Encounter (Signed)
Faxed to Rx to [place] Patient Assistance Program

## 2018-11-21 NOTE — Telephone Encounter (Signed)
Pt dropped off forms. Wanted them checked over before being faxed. Placed in [place] tower.

## 2018-11-22 NOTE — Telephone Encounter (Signed)
Placed in Kate's inbox. 

## 2018-12-07 ENCOUNTER — Other Ambulatory Visit: Payer: Self-pay | Admitting: Primary Care

## 2018-12-07 DIAGNOSIS — E785 Hyperlipidemia, unspecified: Secondary | ICD-10-CM

## 2018-12-07 DIAGNOSIS — E119 Type 2 diabetes mellitus without complications: Secondary | ICD-10-CM

## 2018-12-07 DIAGNOSIS — I10 Essential (primary) hypertension: Secondary | ICD-10-CM

## 2018-12-10 DIAGNOSIS — J4540 Moderate persistent asthma, uncomplicated: Secondary | ICD-10-CM

## 2018-12-11 MED ORDER — ALBUTEROL SULFATE HFA 108 (90 BASE) MCG/ACT IN AERS: INHALATION_SPRAY | RESPIRATORY_TRACT | 0 refills | Status: DC

## 2018-12-11 NOTE — Telephone Encounter (Signed)
Alexandria Leblanc, can we refill the Ventolin through [facility]? How do we need to go about this?

## 2018-12-11 NOTE — Telephone Encounter (Signed)
Printed and will Fax to [facility]

## 2018-12-13 ENCOUNTER — Other Ambulatory Visit: Payer: Self-pay | Admitting: Primary Care

## 2018-12-13 DIAGNOSIS — E119 Type 2 diabetes mellitus without complications: Secondary | ICD-10-CM

## 2018-12-14 ENCOUNTER — Other Ambulatory Visit: Payer: 59

## 2018-12-18 ENCOUNTER — Encounter: Payer: 59 | Admitting: Primary Care

## 2019-01-17 NOTE — Telephone Encounter (Signed)
Leafy Ro, do you know the cost for a Covid test cash price?

## 2019-01-18 ENCOUNTER — Encounter: Payer: Self-pay | Admitting: Primary Care

## 2019-01-18 ENCOUNTER — Other Ambulatory Visit: Payer: Self-pay

## 2019-01-18 ENCOUNTER — Ambulatory Visit (INDEPENDENT_AMBULATORY_CARE_PROVIDER_SITE_OTHER): Payer: Self-pay | Admitting: Primary Care

## 2019-01-18 ENCOUNTER — Other Ambulatory Visit (HOSPITAL_COMMUNITY)
Admission: RE | Admit: 2019-01-18 | Discharge: 2019-01-18 | Disposition: A | Discharge status: Home / Self Care | Payer: Self-pay | Source: Ambulatory Visit | Attending: Primary Care | Admitting: Primary Care

## 2019-01-18 VITALS — BP 136/80 | HR 78 | Temp 98.2°F | Ht 63.0 in | Wt 192.5 lb

## 2019-01-18 DIAGNOSIS — Z124 Encounter for screening for malignant neoplasm of cervix: Secondary | ICD-10-CM

## 2019-01-18 DIAGNOSIS — E785 Hyperlipidemia, unspecified: Secondary | ICD-10-CM

## 2019-01-18 DIAGNOSIS — Z113 Encounter for screening for infections with a predominantly sexual mode of transmission: Secondary | ICD-10-CM

## 2019-01-18 DIAGNOSIS — R102 Pelvic and perineal pain: Secondary | ICD-10-CM | POA: Insufficient documentation

## 2019-01-18 DIAGNOSIS — E119 Type 2 diabetes mellitus without complications: Secondary | ICD-10-CM

## 2019-01-18 DIAGNOSIS — I10 Essential (primary) hypertension: Secondary | ICD-10-CM

## 2019-01-18 LAB — LIPID PANEL
Cholesterol: 166 mg/dL (ref 0–200)
HDL: 36.9 mg/dL — ABNORMAL LOW (ref >39.00)
NonHDL: 129.29
Total CHOL/HDL Ratio: 5
Triglycerides: 255 mg/dL — ABNORMAL HIGH (ref 0.0–149.0)
VLDL: 51 mg/dL — ABNORMAL HIGH (ref 0.0–40.0)

## 2019-01-18 LAB — COMPREHENSIVE METABOLIC PANEL
ALT: 34 U/L (ref 0–35)
AST: 24 U/L (ref 0–37)
Albumin: 4.4 g/dL (ref 3.5–5.2)
Alkaline Phosphatase: 135 U/L — ABNORMAL HIGH (ref 39–117)
BUN: 14 mg/dL (ref 6–23)
CO2: 21 mEq/L (ref 19–32)
Calcium: 9.5 mg/dL (ref 8.4–10.5)
Chloride: 108 mEq/L (ref 96–112)
Creatinine, Ser: 1.13 mg/dL (ref 0.40–1.20)
GFR: 51.29 mL/min — ABNORMAL LOW (ref >60.00)
Glucose, Bld: 119 mg/dL — ABNORMAL HIGH (ref 70–99)
Potassium: 4.5 mEq/L (ref 3.5–5.1)
Sodium: 140 mEq/L (ref 135–145)
Total Bilirubin: 0.5 mg/dL (ref 0.2–1.2)
Total Protein: 7.6 g/dL (ref 6.0–8.3)

## 2019-01-18 LAB — POC URINALSYSI DIPSTICK (AUTOMATED)
Bilirubin, UA: NEGATIVE
Blood, UA: NEGATIVE
Glucose, UA: NEGATIVE
Ketones, UA: NEGATIVE
Leukocytes, UA: NEGATIVE
Nitrite, UA: NEGATIVE
Protein, UA: NEGATIVE
Spec Grav, UA: 1.015 (ref 1.010–1.025)
Urobilinogen, UA: 0.2 E.U./dL
pH, UA: 5.5 (ref 5.0–8.0)

## 2019-01-18 LAB — LDL CHOLESTEROL, DIRECT: Direct LDL: 77 mg/dL

## 2019-01-18 LAB — HEMOGLOBIN A1C: Hgb A1c MFr Bld: 6.7 % — ABNORMAL HIGH (ref 4.6–6.5)

## 2019-01-18 NOTE — Assessment & Plan Note (Signed)
Due for repeat pap smear. Last pap in August 2017 with HPV presence, patient never returned for repeat one year later. Repeat pap pending.

## 2019-01-18 NOTE — Patient Instructions (Signed)
Stop by the lab prior to leaving today. I will notify you of your results once received.   Please notify me if your symptoms return.  It was a pleasure to see you today!

## 2019-01-18 NOTE — Telephone Encounter (Signed)
Sorry, just seeing this. I will find out tomorrow and get back with you.

## 2019-01-18 NOTE — Assessment & Plan Note (Signed)
Occurred earlier this week with fever that resolved. No urinary symptoms. No other symptoms now.  UA today negative. Given unprotected intercourse we will check STD's including gonorrhea, chlamydia, trichomonas. Also check wet prep for BV vs yeast. Labs pending for HIV, RPR, Hep C.

## 2019-01-18 NOTE — Progress Notes (Signed)
Subjective:    Patient ID: Alexandria Leblanc, female    DOB: [birth year], 48 y.o.   MRN: [id number]  HPI  Alexandria Leblanc is a 48 year old female with a history of hypertension, type 2 diabetes, genital herpes, chronic lower back pain who presents today with a chief complaint of bilateral lower back pain.  She is overdue for repeat pap smear.  She also reports suprapubic pressure. Symptoms began two mornings ago has been intermittent since. She tried stretching with temporary improvement. She did notice a sudden onset of chills when urinating, had a fever of 100.7 and took Ibuprofen and Goody Powder. About 30 minutes later her symptoms improved. Yesterday morning she woke up and her temperature was 97.4, no fevers since.  She has noticed a discharge last week, not sure if it's vaginal, yellow in color in underewear. No discharge since. Had a menstrual cycle two weeks ago and hasn't had one in over one year, had unprotected intercourse three weeks ago. Denies vaginal itching, back/flank pain, and suprapubic pressure now.  Review of Systems  Constitutional: Negative for fever.  Gastrointestinal: Negative for abdominal pain and nausea.  Genitourinary: Positive for vaginal discharge. Negative for vaginal bleeding.    Past Medical History:  Diagnosis Date  . Asthma   . Genital warts   . GERD (gastroesophageal reflux disease)   . Hypertension   . Migraines   . Type 2 diabetes mellitus ([place])      Social History   Socioeconomic History  . Marital status: Single    Spouse name: Not on file  . Number of children: Not on file  . Years of education: Not on file  . Highest education level: Not on file  Occupational History  . Not on file  Social Needs  . Financial resource strain: Not on file  . Food insecurity    Worry: Not on file    Inability: Not on file  . Transportation needs    Medical: Not on file    Non-medical: Not on file  Tobacco Use  . Smoking status: Current Every Day  Smoker    Packs/day: 1.00    Types: Cigarettes  . Smokeless tobacco: Never Used  Substance and Sexual Activity  . Alcohol use: Yes    Alcohol/week: 0.0 standard drinks    Comment: occ  . Drug use: No  . Sexual activity: Not on file  Lifestyle  . Physical activity    Days per week: Not on file    Minutes per session: Not on file  . Stress: Not on file  Relationships  . Social Herbalist on phone: Not on file    Gets together: Not on file    Attends religious service: Not on file    Active member of club or organization: Not on file    Attends meetings of clubs or organizations: Not on file    Relationship status: Not on file  . Intimate partner violence    Fear of current or ex partner: Not on file    Emotionally abused: Not on file    Physically abused: Not on file    Forced sexual activity: Not on file  Other Topics Concern  . Not on file  Social History Narrative   Single.   No children.   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           No past surgical history on  file.  Family History  Problem Relation Age of Onset  . Heart disease Mother   . Hypertension Mother   . COPD Father   . Hypertension Father   . Hyperlipidemia Brother   . Prostate cancer Paternal Uncle     Allergies  Allergen Reactions  . Penicillins Hives and Itching  . Sulfa Antibiotics     Current Outpatient Medications on File Prior to Visit  Medication Sig Dispense Refill  . acyclovir (ZOVIRAX) 400 MG tablet TAKE 1 TABLET BY MOUTH TWICE A DAY 180 tablet 1  . albuterol (VENTOLIN HFA) 108 (90 Base) MCG/ACT inhaler INHALE 2 PUFFS INTO THE LUNGS EVERY 6 HOURS AS NEEDED FOR WHEEZING ORSHORTNESS OF BREATH 54 g 0  . atorvastatin (LIPITOR) 20 MG tablet TAKE 1 TABLET BY MOUTH IN THE EVENING 90 tablet 1  . fluticasone (FLONASE) 50 MCG/ACT nasal spray Place 1 spray into both nostrils 2 (two) times daily. 16 g 3  . fluticasone furoate-vilanterol (BREO  ELLIPTA) 100-25 MCG/INH AEPB Inhale 1 puff into the lungs daily. 1 each 11  . glipiZIDE (GLUCOTROL XL) 5 MG 24 hr tablet TAKE 1 TABLET BY MOUTH DAILY WITH BREAKFAST 90 tablet 1  . hydrochlorothiazide (HYDRODIURIL) 25 MG tablet Take 1 tablet by mouth once daily for blood pressure. 90 tablet 3  . losartan (COZAAR) 100 MG tablet TAKE 1 TABLET BY MOUTH ONCE A DAY 90 tablet 1  . metFORMIN (GLUCOPHAGE) 1000 MG tablet TAKE 1 TABLET BY MOUTH TWICE (2) DAILY WITH MEALS FOR DIABETES 180 tablet 0  . Naproxen 375 MG TBEC Take 1 tablet (375 mg total) by mouth 2 (two) times daily. 14 each 0  . omeprazole (PRILOSEC) 20 MG capsule Take 1 tablet by mouth once daily for heartburn. 90 capsule 3   No current facility-administered medications on file prior to visit.     BP 136/80   Pulse 78   Temp 98.2 F (36.8 C) (Temporal)   Ht 5\' 3"  (1.6 m)   Wt 192 lb 8 oz (87.3 kg)   LMP 12/31/2018 (Within Days) Comment: about 2 weeks from today  SpO2 97%   BMI 34.10 kg/m     Objective:   Physical Exam  Constitutional: She appears well-nourished.  Cardiovascular: Normal rate.  Respiratory: Effort normal.  GI: Soft. There is no abdominal tenderness.  Genitourinary: There is no tenderness or lesion on the right labia. There is no tenderness or lesion on the left labia. Cervix exhibits discharge. Cervix exhibits no motion tenderness.    No vaginal discharge or erythema.  No erythema in the vagina.    Genitourinary Comments: Scant amount of thick green discharge from cervix            Assessment & Plan:

## 2019-01-19 LAB — CYTOLOGY - PAP
Diagnosis: NEGATIVE
Diagnosis: REACTIVE
HPV: NOT DETECTED

## 2019-01-19 LAB — WET PREP BY MOLECULAR PROBE
Candida species: NOT DETECTED
Gardnerella vaginalis: NOT DETECTED
MICRO NUMBER:: 793437
SPECIMEN QUALITY:: ADEQUATE
Trichomonas vaginosis: NOT DETECTED

## 2019-01-19 LAB — C. TRACHOMATIS/N. GONORRHOEAE RNA
C. trachomatis RNA, TMA: NOT DETECTED
N. gonorrhoeae RNA, TMA: NOT DETECTED

## 2019-01-19 LAB — HEPATITIS C ANTIBODY
Hepatitis C Ab: NONREACTIVE
SIGNAL TO CUT-OFF: 0.02 (ref <1.00)

## 2019-01-19 LAB — RPR: RPR Ser Ql: NONREACTIVE

## 2019-01-19 LAB — HIV ANTIBODY (ROUTINE TESTING W REFLEX): HIV 1&2 Ab, 4th Generation: NONREACTIVE

## 2019-01-19 NOTE — Telephone Encounter (Addendum)
This has been sent by staff message as well but we did clarify through [employer] that they [employer] for reimbursement.  But, for patient's who do not have insurance, the cost is written off by a government fund; therefore, there is never any cost to the patient.

## 2019-02-01 ENCOUNTER — Other Ambulatory Visit: Payer: Self-pay | Admitting: Primary Care

## 2019-02-01 DIAGNOSIS — K219 Gastro-esophageal reflux disease without esophagitis: Secondary | ICD-10-CM

## 2019-03-20 ENCOUNTER — Other Ambulatory Visit: Payer: Self-pay | Admitting: Primary Care

## 2019-03-20 DIAGNOSIS — I10 Essential (primary) hypertension: Secondary | ICD-10-CM

## 2019-04-13 ENCOUNTER — Other Ambulatory Visit: Payer: Self-pay | Admitting: Primary Care

## 2019-04-13 DIAGNOSIS — E785 Hyperlipidemia, unspecified: Secondary | ICD-10-CM

## 2019-04-13 DIAGNOSIS — E119 Type 2 diabetes mellitus without complications: Secondary | ICD-10-CM

## 2019-04-20 ENCOUNTER — Other Ambulatory Visit: Payer: Self-pay | Admitting: Primary Care

## 2019-04-20 DIAGNOSIS — A6000 Herpesviral infection of urogenital system, unspecified: Secondary | ICD-10-CM

## 2019-04-20 NOTE — Telephone Encounter (Signed)
Patient stated she is almost out of medication and would like this sent today if possible

## 2019-04-29 DIAGNOSIS — J4540 Moderate persistent asthma, uncomplicated: Secondary | ICD-10-CM

## 2019-04-30 MED ORDER — ALBUTEROL SULFATE HFA 108 (90 BASE) MCG/ACT IN AERS: INHALATION_SPRAY | RESPIRATORY_TRACT | 0 refills | Status: DC

## 2019-04-30 NOTE — Telephone Encounter (Signed)
Alexandria Leblanc, did you see my note?

## 2019-04-30 NOTE — Telephone Encounter (Signed)
Thanks! Okay to send one ventolin inhaler without refills.

## 2019-04-30 NOTE — Telephone Encounter (Signed)
Ok to refill. Last prescribed on 12/28/2018. Last appointment on 01/18/2019. No future appointment  These are Rx will have to printed and I can fax them to [facility]

## 2019-04-30 NOTE — Telephone Encounter (Signed)
We sent one years worth refill of her [facility] in June this year. Do they not have the extra refills? Okay to fill both. No refills on albuterol.

## 2019-04-30 NOTE — Telephone Encounter (Signed)
I have called [place] assistance program. They stated that they will sent patient 90 days supply of Breo. However, for the ventolin, I was told it would be best to sent the Rx for next year ahead. Is that okay with you?

## 2019-05-01 NOTE — Telephone Encounter (Signed)
Faxed to [facility].

## 2019-05-11 ENCOUNTER — Other Ambulatory Visit: Payer: Self-pay | Admitting: Primary Care

## 2019-05-11 DIAGNOSIS — I10 Essential (primary) hypertension: Secondary | ICD-10-CM

## 2019-07-11 ENCOUNTER — Other Ambulatory Visit: Payer: Self-pay | Admitting: Primary Care

## 2019-07-11 DIAGNOSIS — E119 Type 2 diabetes mellitus without complications: Secondary | ICD-10-CM

## 2019-07-31 ENCOUNTER — Other Ambulatory Visit: Payer: Self-pay | Admitting: Primary Care

## 2019-07-31 DIAGNOSIS — E119 Type 2 diabetes mellitus without complications: Secondary | ICD-10-CM

## 2019-08-07 ENCOUNTER — Ambulatory Visit (INDEPENDENT_AMBULATORY_CARE_PROVIDER_SITE_OTHER)
Admission: RE | Admit: 2019-08-07 | Discharge: 2019-08-07 | Disposition: A | Discharge status: Home / Self Care | Payer: Managed Care, Other (non HMO) | Source: Ambulatory Visit | Attending: Primary Care | Admitting: Primary Care

## 2019-08-07 ENCOUNTER — Other Ambulatory Visit: Payer: Self-pay

## 2019-08-07 ENCOUNTER — Ambulatory Visit (INDEPENDENT_AMBULATORY_CARE_PROVIDER_SITE_OTHER): Payer: Managed Care, Other (non HMO) | Admitting: Primary Care

## 2019-08-07 ENCOUNTER — Encounter: Payer: Self-pay | Admitting: Primary Care

## 2019-08-07 VITALS — BP 122/82 | HR 69 | Temp 96.9°F | Ht 63.0 in | Wt 196.8 lb

## 2019-08-07 DIAGNOSIS — E119 Type 2 diabetes mellitus without complications: Secondary | ICD-10-CM | POA: Diagnosis not present

## 2019-08-07 DIAGNOSIS — R519 Headache, unspecified: Secondary | ICD-10-CM | POA: Insufficient documentation

## 2019-08-07 DIAGNOSIS — M5441 Lumbago with sciatica, right side: Secondary | ICD-10-CM | POA: Diagnosis not present

## 2019-08-07 DIAGNOSIS — G8929 Other chronic pain: Secondary | ICD-10-CM | POA: Diagnosis not present

## 2019-08-07 DIAGNOSIS — M5442 Lumbago with sciatica, left side: Secondary | ICD-10-CM | POA: Diagnosis not present

## 2019-08-07 DIAGNOSIS — M542 Cervicalgia: Secondary | ICD-10-CM

## 2019-08-07 DIAGNOSIS — I10 Essential (primary) hypertension: Secondary | ICD-10-CM

## 2019-08-07 LAB — BASIC METABOLIC PANEL
BUN: 10 mg/dL (ref 6–23)
CO2: 28 mEq/L (ref 19–32)
Calcium: 9.6 mg/dL (ref 8.4–10.5)
Chloride: 100 mEq/L (ref 96–112)
Creatinine, Ser: 0.6 mg/dL (ref 0.40–1.20)
GFR: 106.23 mL/min (ref >60.00)
Glucose, Bld: 153 mg/dL — ABNORMAL HIGH (ref 70–99)
Potassium: 4 mEq/L (ref 3.5–5.1)
Sodium: 136 mEq/L (ref 135–145)

## 2019-08-07 LAB — CBC
HCT: 41 % (ref 36.0–46.0)
Hemoglobin: 13.9 g/dL (ref 12.0–15.0)
MCHC: 34 g/dL (ref 30.0–36.0)
MCV: 92.3 fl (ref 78.0–100.0)
Platelets: 120 10*3/uL — ABNORMAL LOW (ref 150.0–400.0)
RBC: 4.44 Mil/uL (ref 3.87–5.11)
RDW: 13 % (ref 11.5–15.5)
WBC: 7.2 10*3/uL (ref 4.0–10.5)

## 2019-08-07 LAB — HEMOGLOBIN A1C: Hgb A1c MFr Bld: 7.2 % — ABNORMAL HIGH (ref 4.6–6.5)

## 2019-08-07 IMAGING — DX DG LUMBAR SPINE 2-3V
3 series · 3 of 3 positions shown · non-contrast
Comparison: None.

CLINICAL DATA: Acute on chronic low back pain.

EXAM:
LUMBAR SPINE - 2-3 VIEW

[l-spine ap]
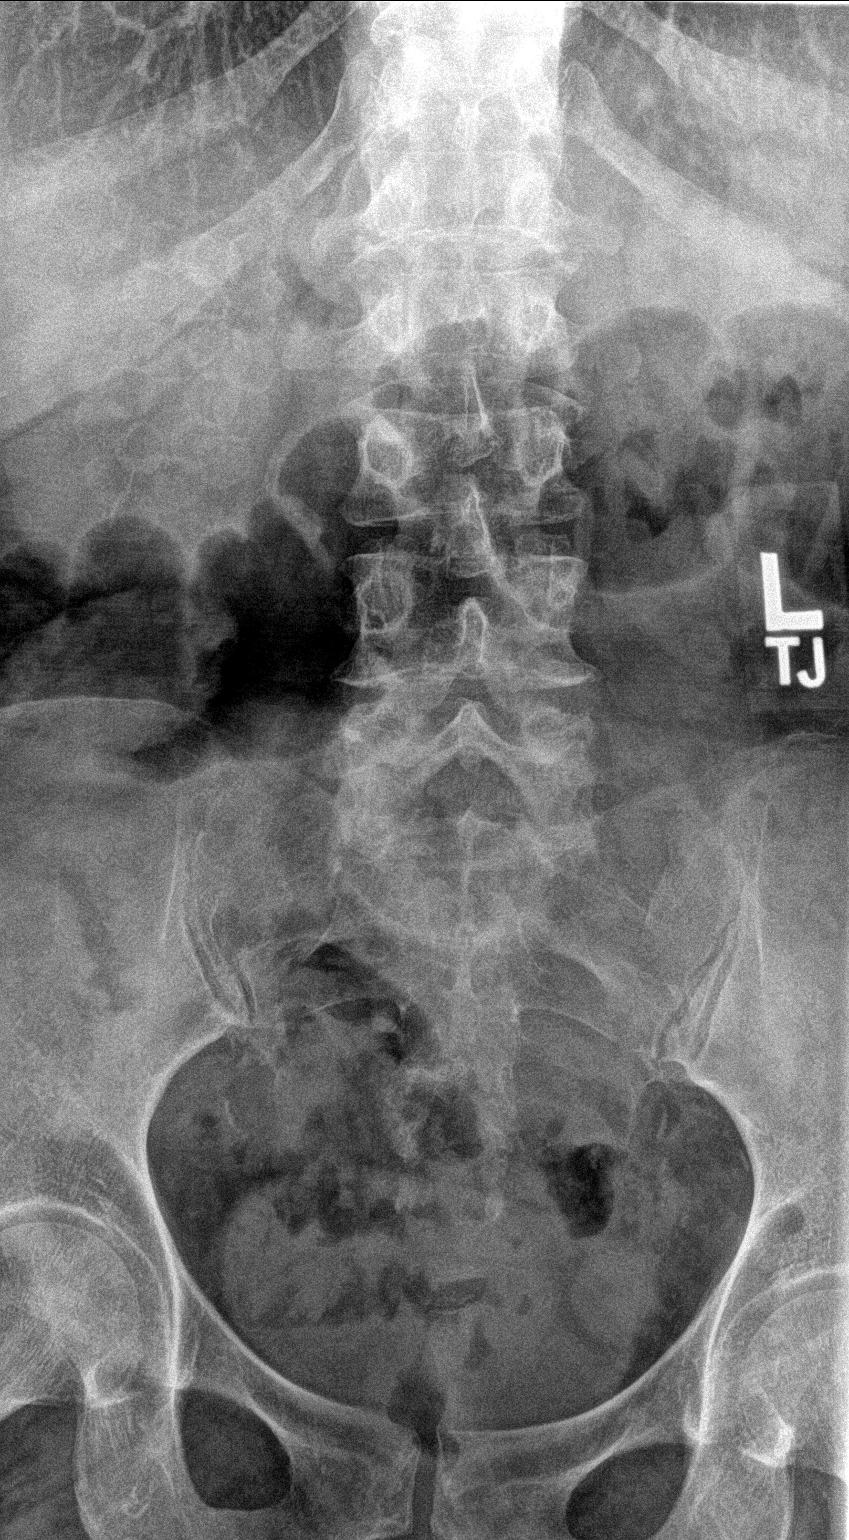

[l-spine lat]
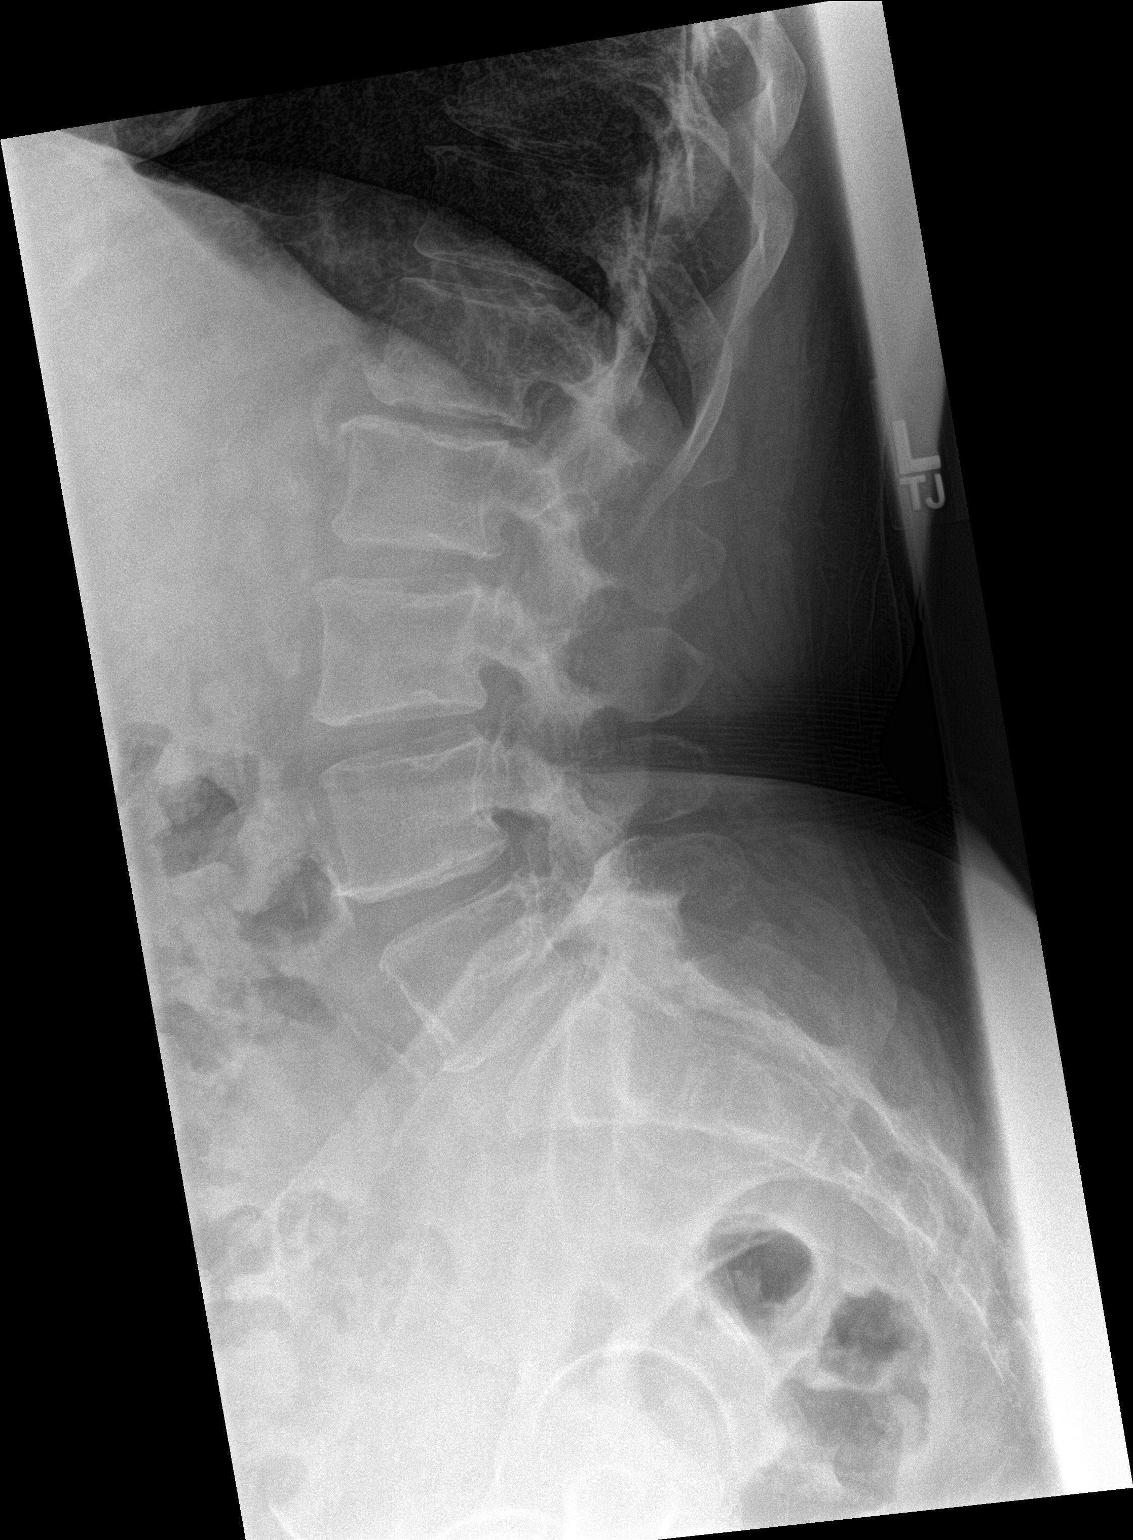

[l-spine l5/s1]
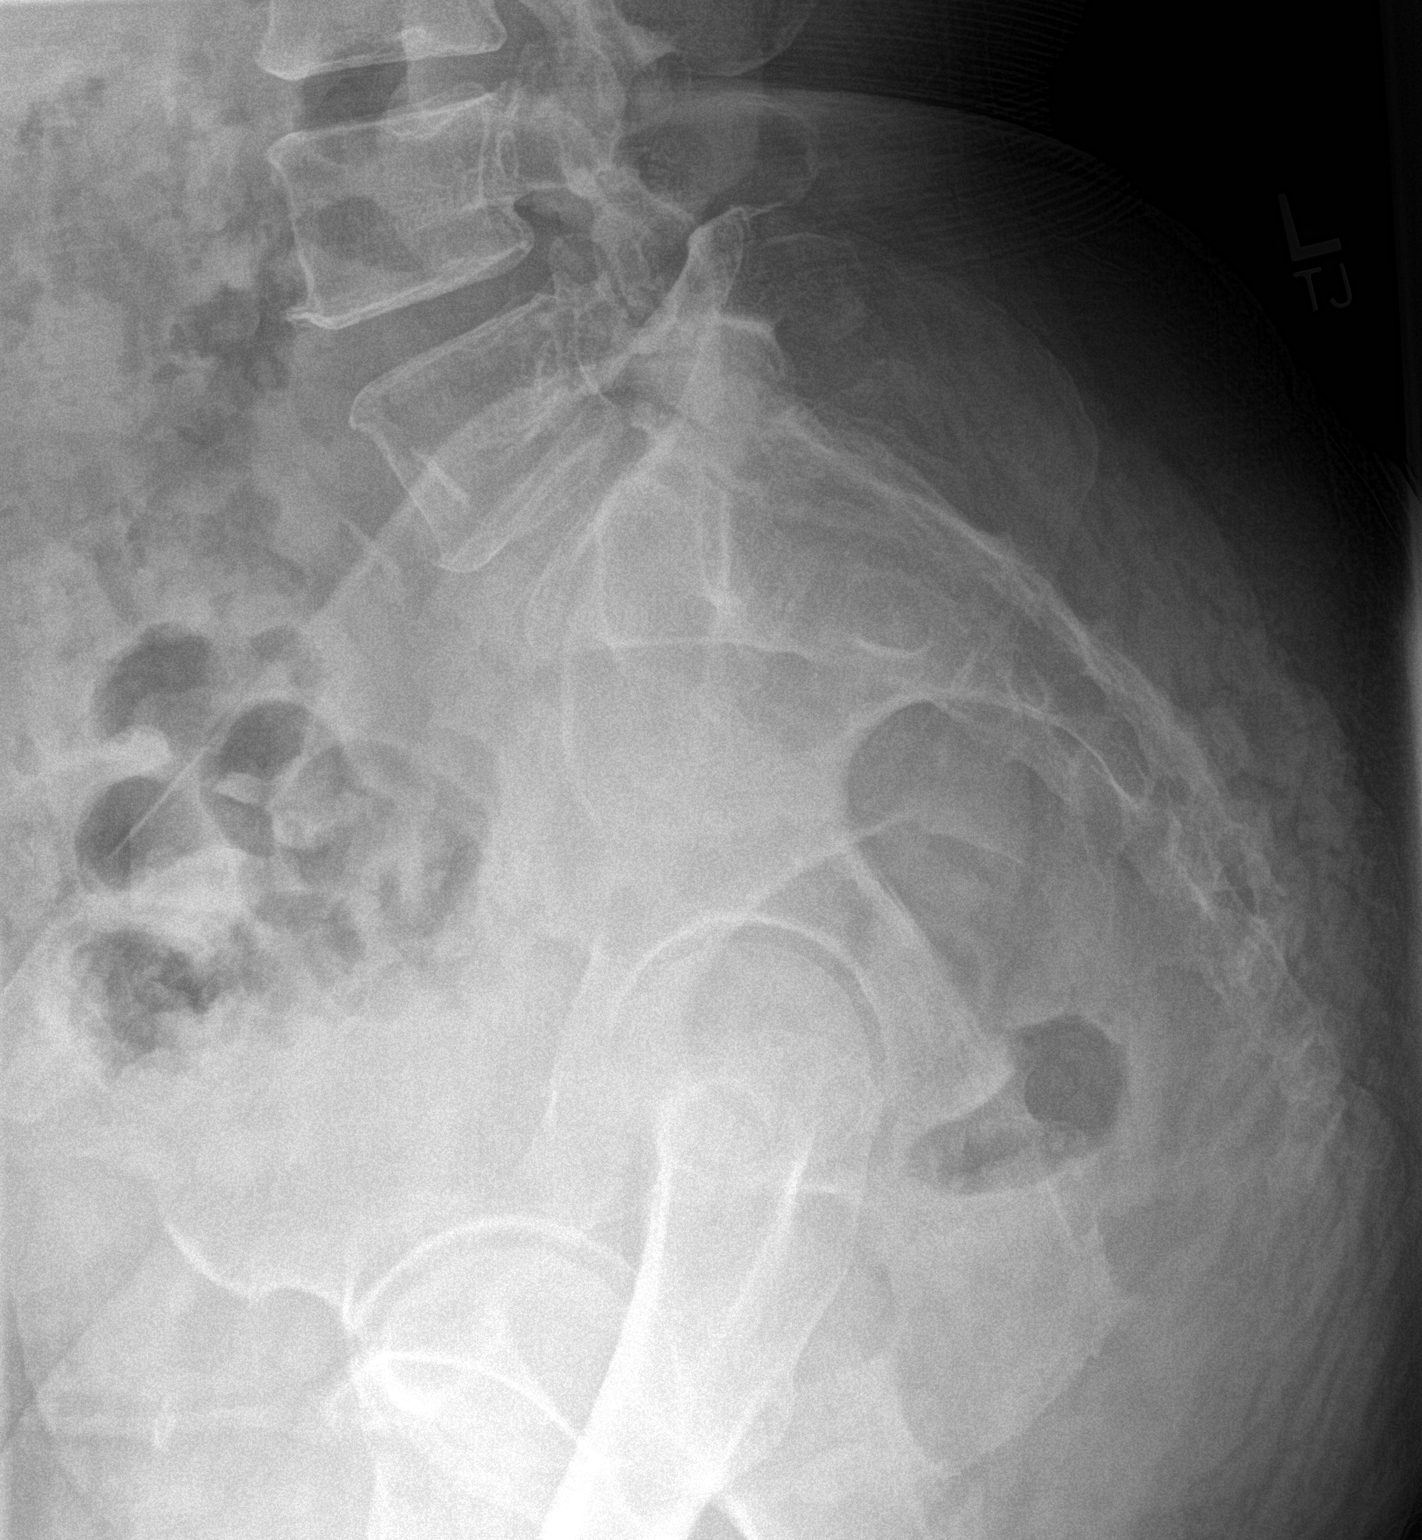

[3 of 3 positions shown; findings below may reference images not displayed]

FINDINGS: Vertebral body height and alignment are maintained. Mild loss of
disc space height is seen at L1-2 and L2-3. There is some facet
arthropathy at L5-S1. Paraspinous structures demonstrate multiple
gallstones and atherosclerosis.
IMPRESSION: No acute finding.

Mild degenerative disease.

Gallstones.

Atherosclerosis.

## 2019-08-07 IMAGING — DX DG CERVICAL SPINE 2 OR 3 VIEWS
4 series · 4 of 4 positions shown · non-contrast
Comparison: None.

CLINICAL DATA: Acute onset neck pain and upper extremity numbness.
No known injury.

EXAM:
CERVICAL SPINE - 2-3 VIEW

[c-spine lat]
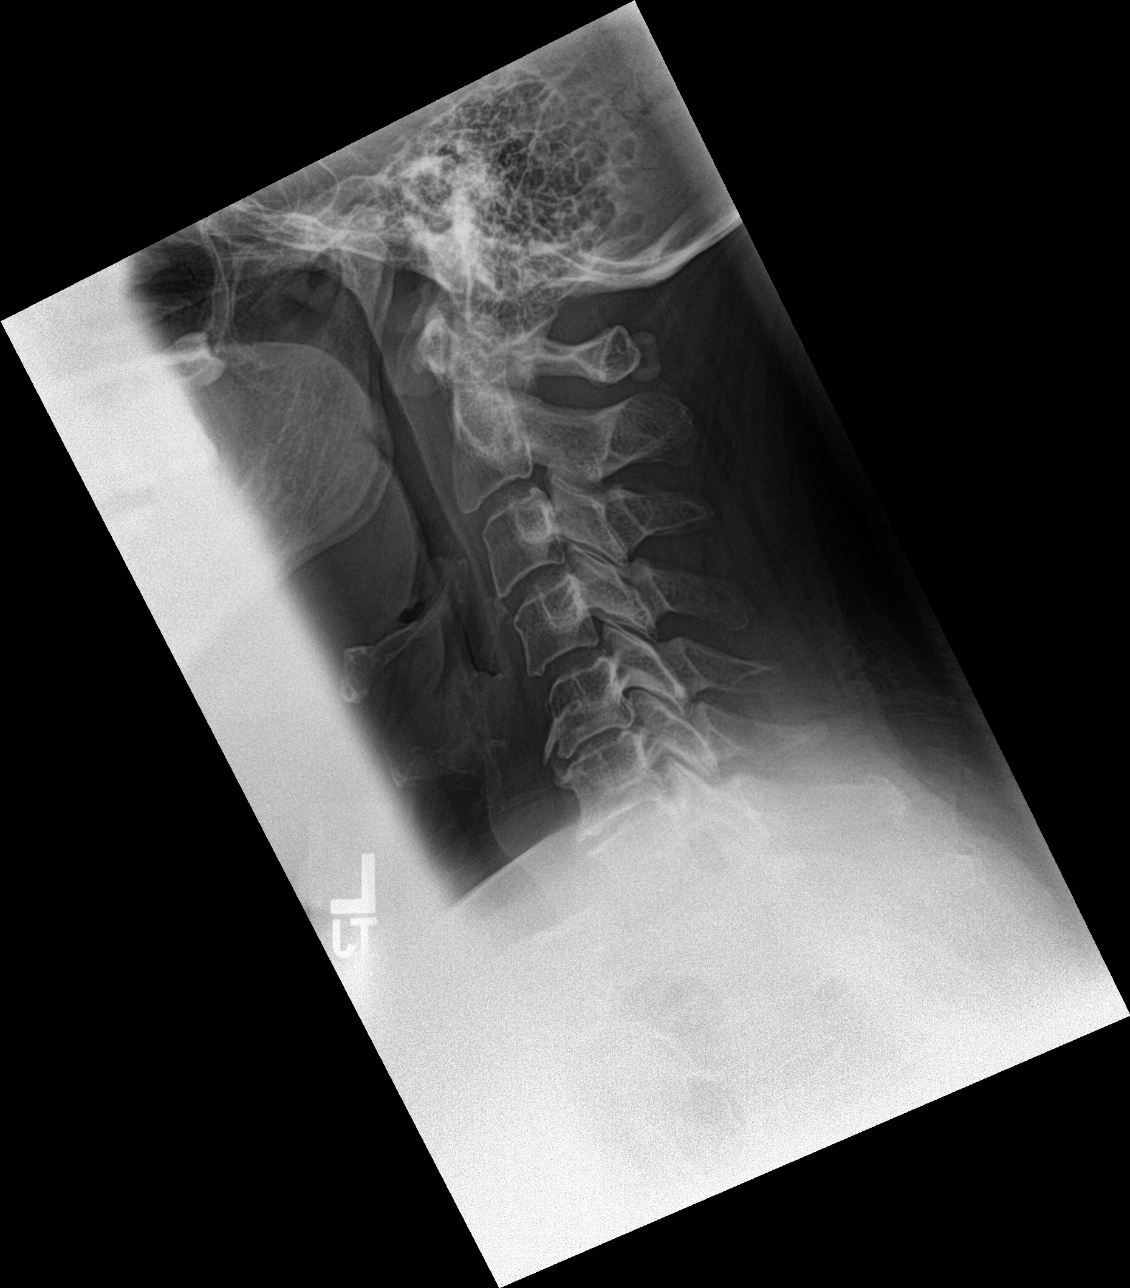

[c-spine ap]
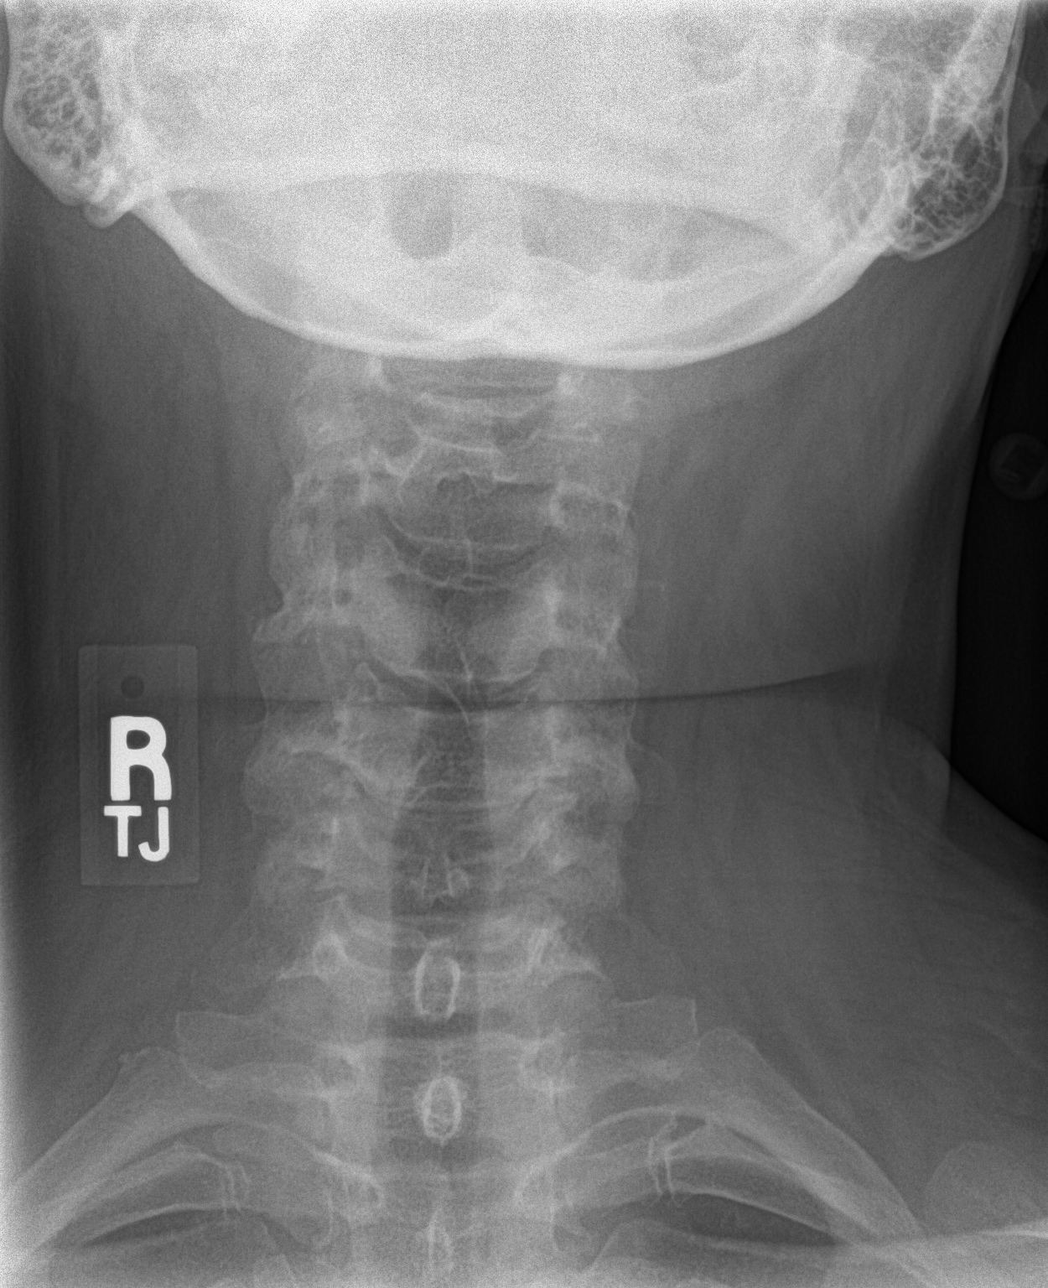

[c-spine open mouth]
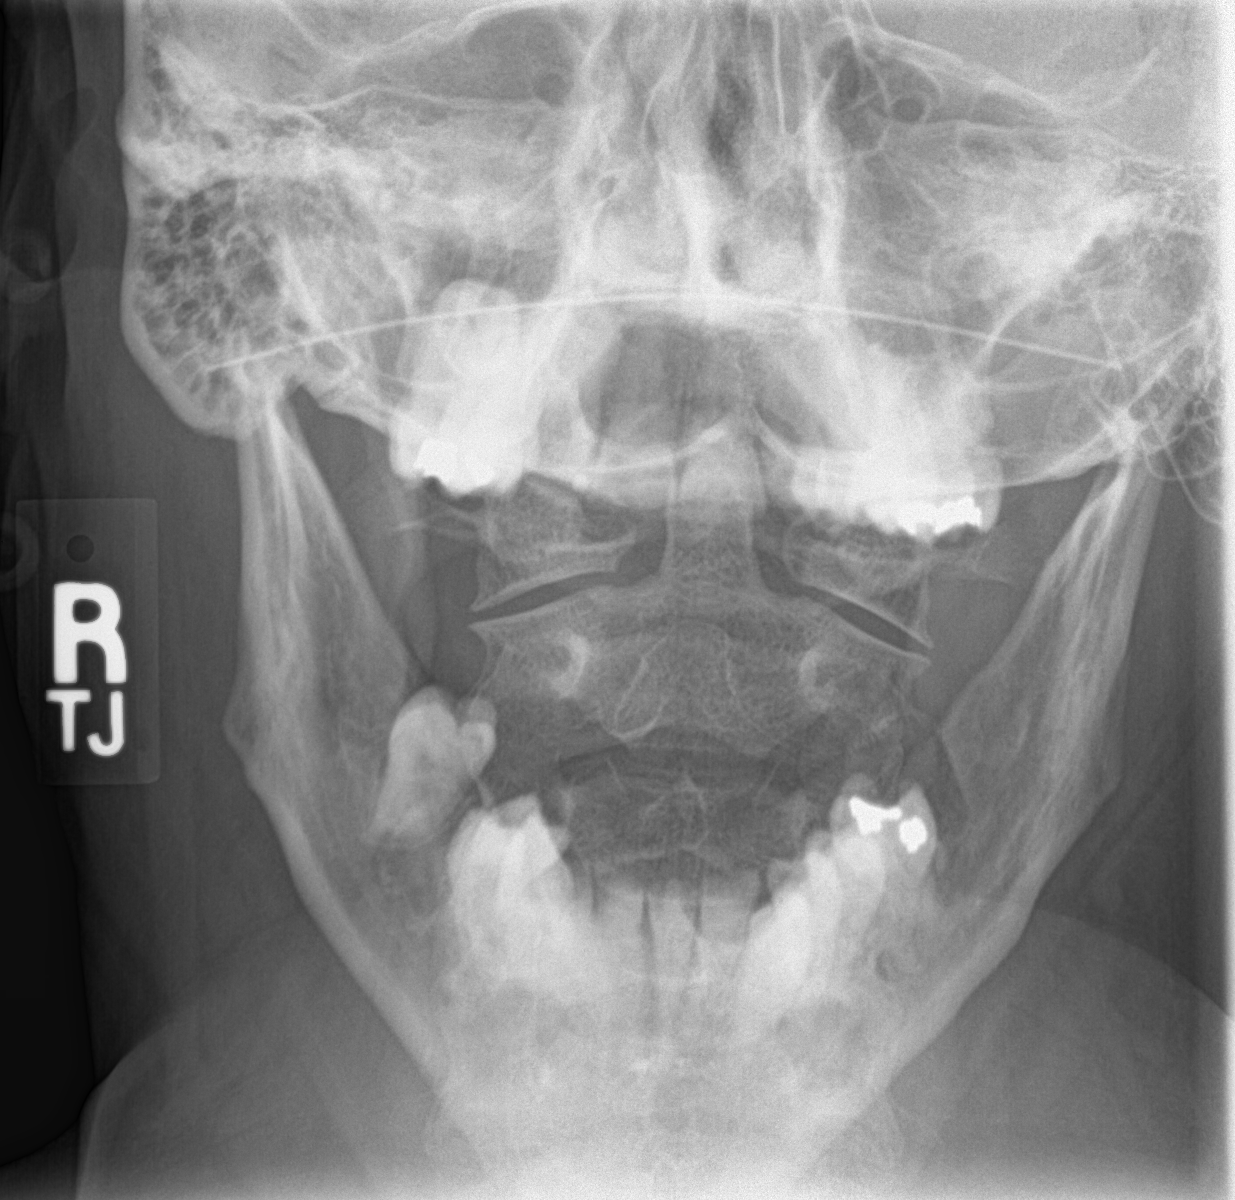

[c-spine swimmers]
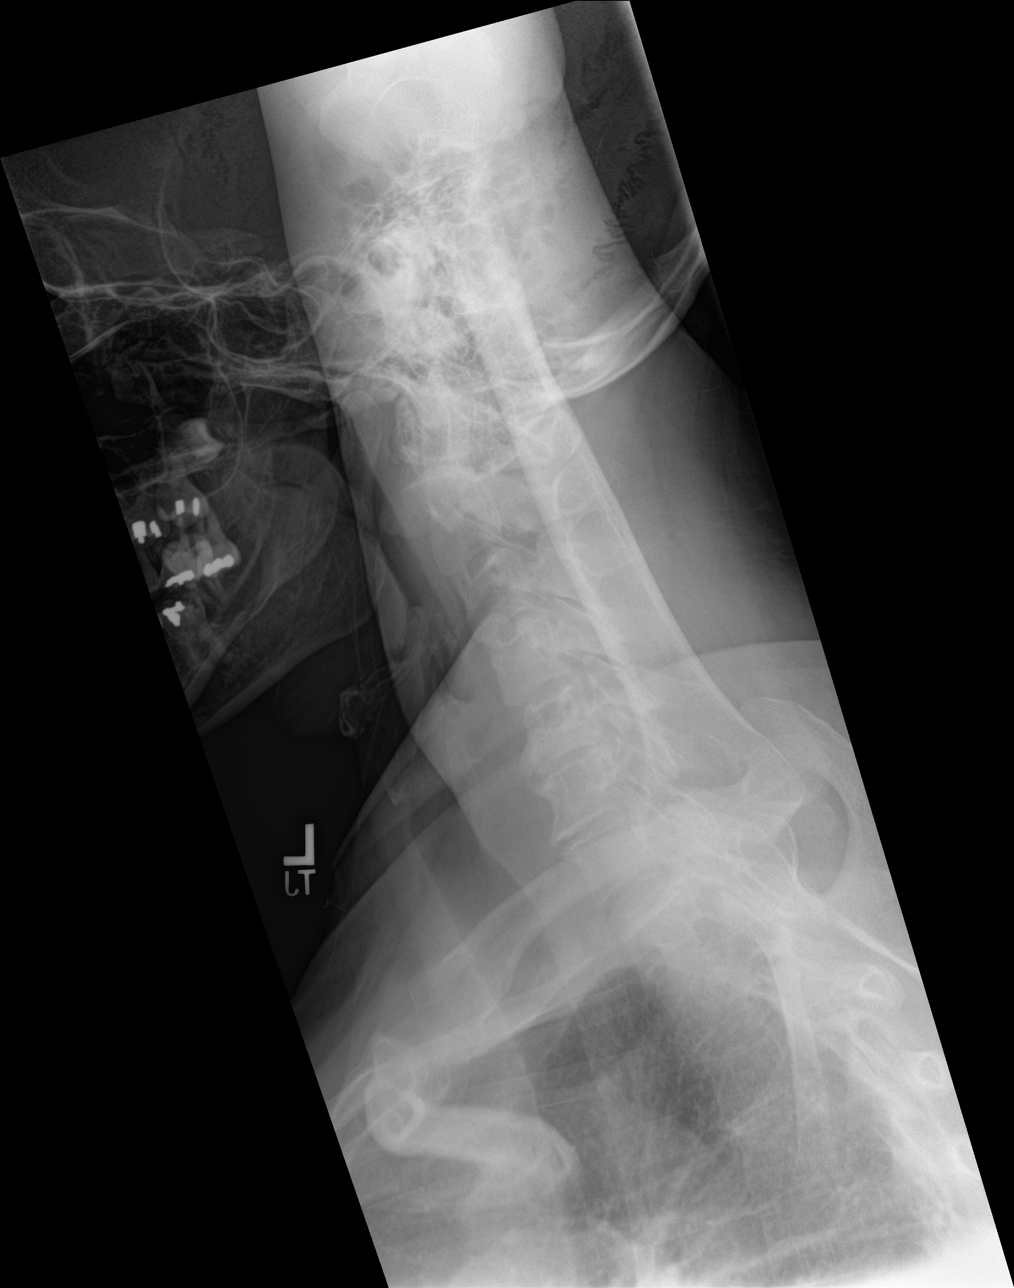

[4 of 4 positions shown; findings below may reference images not displayed]

FINDINGS: No fracture or malalignment. Straightening of lordosis noted. There
is loss of disc space height at C5-6 and C6-7 with associated
endplate spurring. Prevertebral soft tissues appear normal.
IMPRESSION: C5-6 and C6-7 degenerative disc disease.

## 2019-08-07 MED ORDER — PREDNISONE 20 MG PO TABS: ORAL_TABLET | ORAL | 0 refills | Status: DC

## 2019-08-07 MED ORDER — METHOCARBAMOL 500 MG PO TABS: 500.0000 mg | ORAL_TABLET | Freq: Three times a day (TID) | ORAL | 0 refills | Status: DC | PRN

## 2019-08-07 NOTE — Patient Instructions (Signed)
Stop by the lab and xray prior to leaving today. I will notify you of your results once received.   Start prednisone. Take 2 tablets daily for four days, then 1 tablet daily for four days.   You may take the methocarbamol every 8 hours as needed for muscle spasms/tightness. This may cause drowsiness.  Schedule an eye exam as discussed.  It was a pleasure to see you today!

## 2019-08-07 NOTE — Assessment & Plan Note (Signed)
Acute on chronic flare with what sounds like sciatica.  Treat with steroids and muscle relaxer, stretching, heat.  Discussed that symptoms may return in the future. Checking plain films of lumbar spine.  Consider PT vs ortho referral based off of xray results.

## 2019-08-07 NOTE — Assessment & Plan Note (Signed)
Stable in the office today, doubt this to be contributing to headaches.   BMP pending. Continue current regimen.

## 2019-08-07 NOTE — Assessment & Plan Note (Signed)
Compliant to Metformin 1000 mg BID. Repeat A1C pending. Managed on statin and ACE.  Consider holding statin therapy for 2 weeks given generalized body aches.

## 2019-08-07 NOTE — Progress Notes (Signed)
Subjective:    Patient ID: Alexandria Leblanc, female    DOB: 10/07/1970, 49 y.o.   MRN: [id number]  HPI  This visit occurred during the SARS-CoV-2 public health emergency.  Safety protocols were in place, including screening questions prior to the visit, additional usage of staff PPE, and extensive cleaning of exam room while observing appropriate contact time as indicated for disinfecting solutions.   Alexandria Leblanc is a 49 year old female with a history of hypertension, asthma, GERD, type 2 diabetes, chronic lower back pain, hyperlipidemia who presents today with multiple issues.  1) Chronic Back Pain: Chronic pain to bilateral lower back and bilateral lower buttocks with radiation down bilateral lower extremities. Yesterday she was leaning forward to empty the trash can, came back up when the left lower back "caught" and became painful.  She has pain to her right forearm after sweeping the floor at work. She is also losing the feeling in her fingers, also with acute right sided neck pain.  2) Frequent Headaches: Acute for the last two months and are located to the bilateral frontal lobes. Lasting all day long, intensifies at times. She's been taking OTC sinus headache pills without improvement. She denies photophobia, photophobia, nausea. She has noticed a decline in her eye sight, cannot see close up without her readers. She is due to see her eye doctor. She does have some sneezing.   BP Readings from Last 3 Encounters:  08/07/19 122/82  01/18/19 136/80  06/16/18 124/78   3) Acute Neck Pain: Present for the last two months, located to the right mid-lower neck. Yesterday she noticed left shoulder pain with numbness/tingling down the left upper extremity. She does have chronic numbness to her fingers. She is not taken anything OTC for pain.   4) Rash: Chronic to the left medial lower breast that has been intermittent for years. It's not really a "rash" but more of "discoloration". Over the  last couple of months she's noticed the "discoloration" is spreading under the left breast in spots. She has not tried anything topical OTC.  Review of Systems  Eyes: Positive for visual disturbance. Negative for photophobia.  Respiratory: Negative for shortness of breath.   Cardiovascular: Negative for chest pain.  Musculoskeletal: Positive for arthralgias, back pain and neck pain.  Skin: Positive for color change.  Neurological: Positive for numbness and headaches. Negative for weakness.       Past Medical History:  Diagnosis Date  . Asthma   . Genital warts   . GERD (gastroesophageal reflux disease)   . Hypertension   . Migraines   . Type 2 diabetes mellitus ([place])      Social History   Socioeconomic History  . Marital status: Single    Spouse name: Not on file  . Number of children: Not on file  . Years of education: Not on file  . Highest education level: Not on file  Occupational History  . Not on file  Tobacco Use  . Smoking status: Current Every Day Smoker    Packs/day: 1.00    Types: Cigarettes  . Smokeless tobacco: Never Used  Substance and Sexual Activity  . Alcohol use: Yes    Alcohol/week: 0.0 standard drinks    Comment: occ  . Drug use: No  . Sexual activity: Not on file  Other Topics Concern  . Not on file  Social History Narrative   Single.   No children.   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  Highest level of education GED.          Social Determinants of Health   Financial Resource Strain:   . Difficulty of Paying Living Expenses: Not on file  Food Insecurity:   . Worried About Charity fundraiser in the Last Year: Not on file  . Ran Out of Food in the Last Year: Not on file  Transportation Needs:   . Lack of Transportation (Medical): Not on file  . Lack of Transportation (Non-Medical): Not on file  Physical Activity:   . Days of Exercise per Week: Not on file  . Minutes of Exercise per Session: Not on file  Stress:   . Feeling  of Stress : Not on file  Social Connections:   . Frequency of Communication with Friends and Family: Not on file  . Frequency of Social Gatherings with Friends and Family: Not on file  . Attends Religious Services: Not on file  . Active Member of Clubs or Organizations: Not on file  . Attends Archivist Meetings: Not on file  . Marital Status: Not on file  Intimate Partner Violence:   . Fear of Current or Ex-Partner: Not on file  . Emotionally Abused: Not on file  . Physically Abused: Not on file  . Sexually Abused: Not on file    No past surgical history on file.  Family History  Problem Relation Age of Onset  . Heart disease Mother   . Hypertension Mother   . COPD Father   . Hypertension Father   . Hyperlipidemia Brother   . Prostate cancer Paternal Uncle     Allergies  Allergen Reactions  . Penicillins Hives and Itching  . Sulfa Antibiotics     Current Outpatient Medications on File Prior to Visit  Medication Sig Dispense Refill  . acyclovir (ZOVIRAX) 400 MG tablet TAKE 1 TABLET BY MOUTH TWICE A DAY 180 tablet 1  . albuterol (VENTOLIN HFA) 108 (90 Base) MCG/ACT inhaler INHALE 2 PUFFS INTO THE LUNGS EVERY 6 HOURS AS NEEDED FOR WHEEZING ORSHORTNESS OF BREATH 54 g 0  . atorvastatin (LIPITOR) 20 MG tablet TAKE 1 TABLET BY MOUTH ONCE DAILY IN THEEVENING 90 tablet 1  . fluticasone (FLONASE) 50 MCG/ACT nasal spray Place 1 spray into both nostrils 2 (two) times daily. 16 g 3  . fluticasone furoate-vilanterol (BREO ELLIPTA) 100-25 MCG/INH AEPB Inhale 1 puff into the lungs daily. 1 each 11  . glipiZIDE (GLUCOTROL XL) 5 MG 24 hr tablet TAKE 1 TABLET BY MOUTH ONCE A DAY WITH BREAKFAST 90 tablet 1  . hydrochlorothiazide (HYDRODIURIL) 25 MG tablet TAKE 1 TABLET BY MOUTH ONCE A DAY AS NEEDED FOR BLOOD PRESSURE 90 tablet 1  . losartan (COZAAR) 100 MG tablet TAKE 1 TABLET BY MOUTH ONCE DAILY 90 tablet 1  . metFORMIN (GLUCOPHAGE) 1000 MG tablet TAKE 1 TABLET BY MOUTH TWICE DAILY  WITH MEALS FOR DIABETES 180 tablet 1  . omeprazole (PRILOSEC) 20 MG capsule TAKE 1 CAPSULE BY MOUTH ONCE DAILY FOR HEARTBURN 90 capsule 1   No current facility-administered medications on file prior to visit.    BP 122/82   Pulse 69   Temp (!) 96.9 F (36.1 C) (Temporal)   Ht 5\' 3"  (1.6 m)   Wt 196 lb 12 oz (89.2 kg)   LMP  (LMP Unknown)   SpO2 97%   BMI 34.85 kg/m    Objective:   Physical Exam  Constitutional: She appears well-nourished.  Neck: No tracheal tenderness present.  Cardiovascular: Normal rate.  Respiratory: Effort normal.  Musculoskeletal:     Cervical back: Normal range of motion. No erythema. Muscular tenderness present. No spinous process tenderness.     Lumbar back: Pain present. No tenderness. Decreased range of motion.       Back:     Comments: Negative straight leg raise, 5/5 strength to bilateral lower extremities.   Skin: Skin is warm and dry.  Evidence of tinea versicolor (hyperpigmentation) to left medial lower breast and left lateral trunk.            Assessment & Plan:

## 2019-08-07 NOTE — Assessment & Plan Note (Signed)
Daily for the last 2 months.  Neuro exam today unremarkable. Hypertension under good control on losartan.  Discussed to schedule eye exam. Consider allergy cause.  If no improvement then consider daily preventative treatment. She doesn't seem to have migraines.

## 2019-08-07 NOTE — Assessment & Plan Note (Signed)
Acute for the last 2 months, exam today with full ROM but with discomfort.  Checking plain films of cervical spine.  Will treat with steroids and muscle relaxer. Consider PT.

## 2019-08-09 DIAGNOSIS — D696 Thrombocytopenia, unspecified: Secondary | ICD-10-CM

## 2019-08-15 NOTE — Telephone Encounter (Signed)
Patient returned call.  She stated she would like to be seen in [place]

## 2019-08-21 ENCOUNTER — Encounter: Payer: Self-pay | Admitting: Internal Medicine

## 2019-08-21 ENCOUNTER — Other Ambulatory Visit: Payer: Self-pay

## 2019-08-21 NOTE — Progress Notes (Signed)
Patient pre screened for office appointment, no questions or concerns today. Patient reminded of upcoming appointment time and date. 

## 2019-08-22 ENCOUNTER — Inpatient Hospital Stay: Payer: Managed Care, Other (non HMO)

## 2019-08-22 ENCOUNTER — Encounter: Payer: Self-pay | Admitting: Internal Medicine

## 2019-08-22 ENCOUNTER — Other Ambulatory Visit: Payer: Self-pay

## 2019-08-22 ENCOUNTER — Inpatient Hospital Stay: Payer: Managed Care, Other (non HMO) | Attending: Internal Medicine | Admitting: Internal Medicine

## 2019-08-22 VITALS — BP 152/70 | HR 84 | Temp 97.6°F | Wt 197.5 lb

## 2019-08-22 DIAGNOSIS — Z8349 Family history of other endocrine, nutritional and metabolic diseases: Secondary | ICD-10-CM | POA: Diagnosis not present

## 2019-08-22 DIAGNOSIS — Z7951 Long term (current) use of inhaled steroids: Secondary | ICD-10-CM | POA: Diagnosis not present

## 2019-08-22 DIAGNOSIS — M549 Dorsalgia, unspecified: Secondary | ICD-10-CM | POA: Insufficient documentation

## 2019-08-22 DIAGNOSIS — D696 Thrombocytopenia, unspecified: Secondary | ICD-10-CM | POA: Insufficient documentation

## 2019-08-22 DIAGNOSIS — J45909 Unspecified asthma, uncomplicated: Secondary | ICD-10-CM | POA: Diagnosis not present

## 2019-08-22 DIAGNOSIS — E119 Type 2 diabetes mellitus without complications: Secondary | ICD-10-CM | POA: Insufficient documentation

## 2019-08-22 DIAGNOSIS — Z8249 Family history of ischemic heart disease and other diseases of the circulatory system: Secondary | ICD-10-CM | POA: Diagnosis not present

## 2019-08-22 DIAGNOSIS — Z8042 Family history of malignant neoplasm of prostate: Secondary | ICD-10-CM | POA: Diagnosis not present

## 2019-08-22 DIAGNOSIS — Z7984 Long term (current) use of oral hypoglycemic drugs: Secondary | ICD-10-CM | POA: Diagnosis not present

## 2019-08-22 DIAGNOSIS — G8929 Other chronic pain: Secondary | ICD-10-CM | POA: Diagnosis not present

## 2019-08-22 DIAGNOSIS — F17210 Nicotine dependence, cigarettes, uncomplicated: Secondary | ICD-10-CM | POA: Diagnosis not present

## 2019-08-22 DIAGNOSIS — Z79899 Other long term (current) drug therapy: Secondary | ICD-10-CM | POA: Insufficient documentation

## 2019-08-22 DIAGNOSIS — K219 Gastro-esophageal reflux disease without esophagitis: Secondary | ICD-10-CM | POA: Insufficient documentation

## 2019-08-22 DIAGNOSIS — R160 Hepatomegaly, not elsewhere classified: Secondary | ICD-10-CM

## 2019-08-22 DIAGNOSIS — I10 Essential (primary) hypertension: Secondary | ICD-10-CM | POA: Insufficient documentation

## 2019-08-22 LAB — HEPATITIS C ANTIBODY: HCV Ab: NONREACTIVE

## 2019-08-22 LAB — COMPREHENSIVE METABOLIC PANEL
ALT: 83 U/L — ABNORMAL HIGH (ref 0–44)
AST: 43 U/L — ABNORMAL HIGH (ref 15–41)
Albumin: 3.8 g/dL (ref 3.5–5.0)
Alkaline Phosphatase: 150 U/L — ABNORMAL HIGH (ref 38–126)
Anion gap: 12 (ref 5–15)
BUN: 10 mg/dL (ref 6–20)
CO2: 22 mmol/L (ref 22–32)
Calcium: 8.7 mg/dL — ABNORMAL LOW (ref 8.9–10.3)
Chloride: 100 mmol/L (ref 98–111)
Creatinine, Ser: 0.48 mg/dL (ref 0.44–1.00)
GFR calc Af Amer: >60 mL/min (ref >60)
GFR calc non Af Amer: >60 mL/min (ref >60)
Glucose, Bld: 211 mg/dL — ABNORMAL HIGH (ref 70–99)
Potassium: 3.7 mmol/L (ref 3.5–5.1)
Sodium: 134 mmol/L — ABNORMAL LOW (ref 135–145)
Total Bilirubin: 0.4 mg/dL (ref 0.3–1.2)
Total Protein: 7.3 g/dL (ref 6.5–8.1)

## 2019-08-22 LAB — CBC WITH DIFFERENTIAL/PLATELET
Abs Immature Granulocytes: 0.04 10*3/uL (ref 0.00–0.07)
Basophils Absolute: 0 10*3/uL (ref 0.0–0.1)
Basophils Relative: 0 %
Eosinophils Absolute: 0.1 10*3/uL (ref 0.0–0.5)
Eosinophils Relative: 1 %
HCT: 39.4 % (ref 36.0–46.0)
Hemoglobin: 13.5 g/dL (ref 12.0–15.0)
Immature Granulocytes: 1 %
Lymphocytes Relative: 27 %
Lymphs Abs: 2.2 10*3/uL (ref 0.7–4.0)
MCH: 30.8 pg (ref 26.0–34.0)
MCHC: 34.3 g/dL (ref 30.0–36.0)
MCV: 90 fL (ref 80.0–100.0)
Monocytes Absolute: 0.7 10*3/uL (ref 0.1–1.0)
Monocytes Relative: 9 %
Neutro Abs: 4.9 10*3/uL (ref 1.7–7.7)
Neutrophils Relative %: 62 %
Platelets: 106 10*3/uL — ABNORMAL LOW (ref 150–400)
RBC: 4.38 MIL/uL (ref 3.87–5.11)
RDW: 12.9 % (ref 11.5–15.5)
WBC: 7.9 10*3/uL (ref 4.0–10.5)
nRBC: 0 % (ref 0.0–0.2)

## 2019-08-22 LAB — HEPATITIS B CORE ANTIBODY, IGM: Hep B C IgM: NONREACTIVE

## 2019-08-22 LAB — FOLATE: Folate: 10.7 ng/mL (ref >5.9)

## 2019-08-22 LAB — HIV ANTIBODY (ROUTINE TESTING W REFLEX): HIV Screen 4th Generation wRfx: NONREACTIVE

## 2019-08-22 LAB — TECHNOLOGIST SMEAR REVIEW: Plt Morphology: DECREASED

## 2019-08-22 LAB — LACTATE DEHYDROGENASE: LDH: 133 U/L (ref 98–192)

## 2019-08-22 LAB — VITAMIN B12: Vitamin B-12: 389 pg/mL (ref 180–914)

## 2019-08-22 LAB — HEPATITIS B SURFACE ANTIGEN: Hepatitis B Surface Ag: NONREACTIVE

## 2019-08-22 MED ORDER — NYSTATIN 100000 UNIT/ML MT SUSP: 5.0000 mL | Freq: Four times a day (QID) | OROMUCOSAL | 0 refills | Status: DC

## 2019-08-22 NOTE — Assessment & Plan Note (Addendum)
#  Chronic mild thrombocytopenia about 100,000 asymptomatic.  I had a long discussion with the patient regarding the multiple causes of thrombocytopenia including but not limited to medications liver disease/splenomegaly infections; immune related thrombocytopenia; less likely malignant causes.  Clinically  not suspicious for malignant causes.  #Discussed of ITP if ITP is a potential cause-would not recommend treatment unless less than 50/or symptomatic bleeding.  However await above work-up for definite management plan.  Recommend checking CBC;CMP WITH LDH; Check peripheral smear.  HIV hepatitis workup. 123456 and folic acid. Check ultrasound of the abdomen.   # mild oral thrush- nystain is recommended prescription sent  Thank you, Ms.Clark NP for allowing me to participate in the care of your pleasant patient. Please do not hesitate to contact me with questions or concerns in the interim.  # DISPOSITION: # labs- today; # [place] in 1 week # follow up-2 weeks- No labs- Dr.B

## 2019-08-22 NOTE — Progress Notes (Signed)
Alexandria Leblanc NOTE  Patient Care Team: Pleas Koch, NP as PCP - General (Internal Medicine)  CHIEF COMPLAINTS/PURPOSE OF CONSULTATION: Thrombocytopenia  HEMATOLOGY HISTORY  # THROMBOCYTOPENIA [since 2016] 110-170; normal white count hemoglobin; asymptomatic  #Diabetes/no alcohol abuse/smoker  HISTORY OF PRESENTING ILLNESS: Reviewed the records at length-office note/labs from referral providers office- dated - summarized above.   Alexandria Leblanc 49 y.o.  female pleasant patient was been referred to [place] for further evaluation of thrombocytopenia.  Patient has chronic back pain-which has been recently getting worse.  This was evaluated PCP-no recent work-up noted to have worsening of thrombocytopenia-120.   Patient denies any history of hepatitis or alcohol abuse.  She drinks 5 beers a week.  Chronic difficulty swallowing pain while swallowing recently.  Patient was recently given steroids for back pain.   Review of Systems  Constitutional: Positive for malaise/fatigue. Negative for chills, diaphoresis, fever and weight loss.  HENT: Negative for nosebleeds and sore throat.   Eyes: Negative for double vision.  Respiratory: Negative for cough, hemoptysis, sputum production, shortness of breath and wheezing.   Cardiovascular: Negative for chest pain, palpitations, orthopnea and leg swelling.  Gastrointestinal: Positive for diarrhea (metformin). Negative for abdominal pain, blood in stool, constipation, heartburn, melena, nausea and vomiting.  Genitourinary: Negative for dysuria, frequency and urgency.  Musculoskeletal: Negative for back pain and joint pain.  Skin: Negative.  Negative for itching and rash.  Neurological: Positive for headaches. Negative for dizziness, tingling, focal weakness and weakness.  Endo/Heme/Allergies: Does not bruise/bleed easily.  Psychiatric/Behavioral: Negative for depression. The patient is not nervous/anxious and does not have  insomnia.      MEDICAL HISTORY:  Past Medical History:  Diagnosis Date  . Asthma   . Genital warts   . GERD (gastroesophageal reflux disease)   . Hypertension   . Migraines   . Type 2 diabetes mellitus ([place])     SURGICAL HISTORY: History reviewed. No pertinent surgical history.  SOCIAL HISTORY: Social History   Socioeconomic History  . Marital status: Single    Spouse name: Not on file  . Number of children: Not on file  . Years of education: Not on file  . Highest education level: Not on file  Occupational History  . Not on file  Tobacco Use  . Smoking status: Current Every Day Smoker    Packs/day: 1.00    Types: Cigarettes  . Smokeless tobacco: Never Used  Substance and Sexual Activity  . Alcohol use: Yes    Alcohol/week: 0.0 standard drinks    Comment: occ  . Drug use: No  . Sexual activity: Not on file  Other Topics Concern  . 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 1and half a day- smoking; 5 beers a week; no hard liqor.           Social Determinants of Health   Financial Resource Strain:   . Difficulty of Paying Living Expenses:   Food Insecurity:   . Worried About Charity fundraiser in the Last Year:   . Arboriculturist in the Last Year:   Transportation Needs:   . Film/video editor (Medical):   Marland Kitchen Lack of Transportation (Non-Medical):   Physical Activity:   . Days of Exercise per Week:   . Minutes of Exercise per Session:   Stress:   . Feeling of  Stress :   Social Connections:   . Frequency of Communication with Friends and Family:   . Frequency of Social Gatherings with Friends and Family:   . Attends Religious Services:   . Active Member of Clubs or Organizations:   . Attends Archivist Meetings:   Marland Kitchen Marital Status:   Intimate Partner Violence:   . Fear of Current or Ex-Partner:   . Emotionally Abused:   Marland Kitchen  Physically Abused:   . Sexually Abused:     FAMILY HISTORY: Family History  Problem Relation Age of Onset  . Heart disease Mother   . Hypertension Mother   . COPD Father   . Hypertension Father   . Hyperlipidemia Brother   . Prostate cancer Paternal Uncle     ALLERGIES:  is allergic to penicillins and sulfa antibiotics.  MEDICATIONS:  Current Outpatient Medications  Medication Sig Dispense Refill  . acyclovir (ZOVIRAX) 400 MG tablet TAKE 1 TABLET BY MOUTH TWICE A DAY 180 tablet 1  . albuterol (VENTOLIN HFA) 108 (90 Base) MCG/ACT inhaler INHALE 2 PUFFS INTO THE LUNGS EVERY 6 HOURS AS NEEDED FOR WHEEZING ORSHORTNESS OF BREATH 54 g 0  . atorvastatin (LIPITOR) 20 MG tablet TAKE 1 TABLET BY MOUTH ONCE DAILY IN THEEVENING 90 tablet 1  . fluticasone (FLONASE) 50 MCG/ACT nasal spray Place 1 spray into both nostrils 2 (two) times daily. 16 g 3  . fluticasone furoate-vilanterol (BREO ELLIPTA) 100-25 MCG/INH AEPB Inhale 1 puff into the lungs daily. 1 each 11  . glipiZIDE (GLUCOTROL XL) 5 MG 24 hr tablet TAKE 1 TABLET BY MOUTH ONCE A DAY WITH BREAKFAST 90 tablet 1  . hydrochlorothiazide (HYDRODIURIL) 25 MG tablet TAKE 1 TABLET BY MOUTH ONCE A DAY AS NEEDED FOR BLOOD PRESSURE 90 tablet 1  . losartan (COZAAR) 100 MG tablet TAKE 1 TABLET BY MOUTH ONCE DAILY 90 tablet 1  . metFORMIN (GLUCOPHAGE) 1000 MG tablet TAKE 1 TABLET BY MOUTH TWICE DAILY WITH MEALS FOR DIABETES 180 tablet 1  . methocarbamol (ROBAXIN) 500 MG tablet Take 1 tablet (500 mg total) by mouth every 8 (eight) hours as needed for muscle spasms. 30 tablet 0  . omeprazole (PRILOSEC) 20 MG capsule TAKE 1 CAPSULE BY MOUTH ONCE DAILY FOR HEARTBURN 90 capsule 1  . nystatin (MYCOSTATIN) 100000 UNIT/ML suspension Take 5 mLs (500,000 Units total) by mouth 4 (four) times daily. 60 mL 0   No current facility-administered medications for this visit.     Marland Kitchen  PHYSICAL EXAMINATION:   Vitals:   08/22/19 1120  BP: (!) 152/70  Pulse: 84   Temp: 97.6 F (36.4 C)   Filed Weights   08/22/19 1120  Weight: 197 lb 8 oz (89.6 kg)    Physical Exam  Constitutional: She is oriented to person, place, and time and well-developed, well-nourished, and in no distress.  HENT:  Head: Normocephalic and atraumatic.  Mouth/Throat: Oropharynx is clear and moist. No oropharyngeal exudate.  Eyes: Pupils are equal, round, and reactive to light.  Cardiovascular: Normal rate and regular rhythm.  Pulmonary/Chest: No respiratory distress. She has no wheezes.  Clear to auscultation bilaterally.  Abdominal: Soft. Bowel sounds are normal. She exhibits no distension and no mass. There is no abdominal tenderness. There is no rebound and no guarding.  Positive for hepatomegaly.  No splenomegaly.  Musculoskeletal:        General: No tenderness or edema. Normal range of motion.     Cervical back: Normal range of motion and neck supple.  Neurological: She is alert and oriented to person, place, and time.  Skin: Skin is warm.  Psychiatric: Affect normal.     LABORATORY DATA:  I have reviewed the data as listed Lab Results  Component Value Date   WBC 7.9 08/22/2019   HGB 13.5 08/22/2019   HCT 39.4 08/22/2019   MCV 90.0 08/22/2019   PLT 106 (L) 08/22/2019   Recent Labs    01/18/19 1008 08/07/19 1043  NA 140 136  K 4.5 4.0  CL 108 100  CO2 21 28  GLUCOSE 119* 153*  BUN 14 10  CREATININE 1.13 0.60  CALCIUM 9.5 9.6  PROT 7.6  --   ALBUMIN 4.4  --   AST 24  --   ALT 34  --   ALKPHOS 135*  --   BILITOT 0.5  --      DG Cervical Spine 2 or 3 views  Result Date: 08/07/2019 CLINICAL DATA:  Acute onset neck pain and upper extremity numbness. No known injury. EXAM: CERVICAL SPINE - 2-3 VIEW COMPARISON:  None. FINDINGS: No fracture or malalignment. Straightening of lordosis noted. There is loss of disc space height at C5-6 and C6-7 with associated endplate spurring. Prevertebral soft tissues appear normal. IMPRESSION: C5-6 and C6-7  degenerative disc disease. Electronically Signed   By: Inge Rise M.D.   On: 08/07/2019 15:29   DG Lumbar Spine 2-3 Views  Result Date: 08/07/2019 CLINICAL DATA:  Acute on chronic low back pain. EXAM: LUMBAR SPINE - 2-3 VIEW COMPARISON:  None. FINDINGS: Vertebral body height and alignment are maintained. Mild loss of disc space height is seen at L1-2 and L2-3. There is some facet arthropathy at L5-S1. Paraspinous structures demonstrate multiple gallstones and atherosclerosis. IMPRESSION: No acute finding. Mild degenerative disease. Gallstones. Atherosclerosis. Electronically Signed   By: Inge Rise M.D.   On: 08/07/2019 15:28    ASSESSMENT & PLAN:   Thrombocytopenia ([place]) #Chronic mild thrombocytopenia about 100,000 asymptomatic.  I had a long discussion with the patient regarding the multiple causes of thrombocytopenia including but not limited to medications liver disease/splenomegaly infections; immune related thrombocytopenia; less likely malignant causes.  Clinically  not suspicious for malignant causes.  #Discussed of ITP if ITP is a potential cause-would not recommend treatment unless less than 50/or symptomatic bleeding.  However await above work-up for definite management plan.  Recommend checking CBC;CMP WITH LDH; Check peripheral smear.  HIV hepatitis workup. 123456 and folic acid. Check ultrasound of the abdomen.   # mild oral thrush- nystain is recommended prescription sent  Thank you, Ms.Clark NP for allowing me to participate in the care of your pleasant patient. Please do not hesitate to contact me with questions or concerns in the interim.  # DISPOSITION: # labs- today; # [place] in 1 week # follow up-2 weeks- No labs- Dr.B    All questions were answered. The patient knows to call the clinic with any problems, questions or concerns.  Thank you Dr. for allowing me to participate in the care of your pleasant patient. Please do not hesitate to contact me with questions  or concerns in the interim.   Cammie Sickle, MD 08/22/2019 12:27 PM   # 45 minutes face-to-face with the patient discussing the above plan of care; more than 50% of time spent on counseling and coordination.

## 2019-08-26 DIAGNOSIS — J4540 Moderate persistent asthma, uncomplicated: Secondary | ICD-10-CM

## 2019-08-27 MED ORDER — ALBUTEROL SULFATE HFA 108 (90 BASE) MCG/ACT IN AERS: INHALATION_SPRAY | RESPIRATORY_TRACT | 0 refills | Status: DC

## 2019-08-27 NOTE — Telephone Encounter (Signed)
I have called [facility] and canceled the refill. I have called patient and she stated that she is completely out  [facility] to sent a printed refill to [facility]? Last prescribed on 04/30/2019 for 3 inhaler with 0 refills. Last seen on 08/07/2019. No future appointment

## 2019-08-27 NOTE — Telephone Encounter (Signed)
Okay to fill? 

## 2019-08-28 ENCOUNTER — Other Ambulatory Visit: Payer: Self-pay | Admitting: Primary Care

## 2019-08-28 DIAGNOSIS — I10 Essential (primary) hypertension: Secondary | ICD-10-CM

## 2019-08-29 ENCOUNTER — Ambulatory Visit: Payer: Managed Care, Other (non HMO)

## 2019-08-29 MED ORDER — ALBUTEROL SULFATE HFA 108 (90 BASE) MCG/ACT IN AERS: INHALATION_SPRAY | RESPIRATORY_TRACT | 0 refills | Status: DC

## 2019-09-05 ENCOUNTER — Inpatient Hospital Stay: Payer: Managed Care, Other (non HMO) | Admitting: Internal Medicine

## 2019-09-06 ENCOUNTER — Inpatient Hospital Stay: Payer: Managed Care, Other (non HMO) | Attending: Internal Medicine | Admitting: Internal Medicine

## 2019-09-06 ENCOUNTER — Other Ambulatory Visit: Payer: Self-pay

## 2019-09-06 ENCOUNTER — Encounter: Payer: Self-pay | Admitting: Internal Medicine

## 2019-09-06 DIAGNOSIS — R197 Diarrhea, unspecified: Secondary | ICD-10-CM | POA: Insufficient documentation

## 2019-09-06 DIAGNOSIS — D696 Thrombocytopenia, unspecified: Secondary | ICD-10-CM | POA: Diagnosis not present

## 2019-09-06 DIAGNOSIS — M545 Low back pain: Secondary | ICD-10-CM | POA: Insufficient documentation

## 2019-09-06 DIAGNOSIS — E119 Type 2 diabetes mellitus without complications: Secondary | ICD-10-CM | POA: Insufficient documentation

## 2019-09-06 DIAGNOSIS — I10 Essential (primary) hypertension: Secondary | ICD-10-CM | POA: Diagnosis not present

## 2019-09-06 DIAGNOSIS — G8929 Other chronic pain: Secondary | ICD-10-CM | POA: Diagnosis not present

## 2019-09-06 DIAGNOSIS — Z79899 Other long term (current) drug therapy: Secondary | ICD-10-CM | POA: Insufficient documentation

## 2019-09-06 DIAGNOSIS — Z7984 Long term (current) use of oral hypoglycemic drugs: Secondary | ICD-10-CM | POA: Diagnosis not present

## 2019-09-06 DIAGNOSIS — F17210 Nicotine dependence, cigarettes, uncomplicated: Secondary | ICD-10-CM | POA: Insufficient documentation

## 2019-09-06 DIAGNOSIS — R7401 Elevation of levels of liver transaminase levels: Secondary | ICD-10-CM | POA: Insufficient documentation

## 2019-09-06 NOTE — Progress Notes (Signed)
[facility] CONSULT NOTE  Patient Care Team: Pleas Koch, NP as PCP - General (Internal Medicine)  CHIEF COMPLAINTS/PURPOSE OF CONSULTATION: Thrombocytopenia  HEMATOLOGY HISTORY  # THROMBOCYTOPENIA [since 2016] 110-170; normal white count hemoglobin; asymptomatic; hepatitis HIV-negative; LDH normal.  April 2021-ultrasound abdomen not done-financial reasons  #Diabetes/no alcohol abuse/smoker  HISTORY OF PRESENTING ILLNESS:   Alexandria Leblanc 49 y.o.  female pleasant patient is here for follow-up to review the work-up for thrombocytopenia.  Patient continues to have chronic back pain.  Feels that/might not have resolved.  Chronic mild diarrhea.   Review of Systems  Constitutional: Positive for malaise/fatigue. Negative for chills, diaphoresis, fever and weight loss.  HENT: Negative for nosebleeds and sore throat.   Eyes: Negative for double vision.  Respiratory: Negative for cough, hemoptysis, sputum production, shortness of breath and wheezing.   Cardiovascular: Negative for chest pain, palpitations, orthopnea and leg swelling.  Gastrointestinal: Positive for diarrhea (metformin). Negative for abdominal pain, blood in stool, constipation, heartburn, melena, nausea and vomiting.  Genitourinary: Negative for dysuria, frequency and urgency.  Musculoskeletal: Negative for back pain and joint pain.  Skin: Negative.  Negative for itching and rash.  Neurological: Positive for headaches. Negative for dizziness, tingling, focal weakness and weakness.  Endo/Heme/Allergies: Does not bruise/bleed easily.  Psychiatric/Behavioral: Negative for depression. The patient is not nervous/anxious and does not have insomnia.      MEDICAL HISTORY:  Past Medical History:  Diagnosis Date  . Asthma   . Genital warts   . GERD (gastroesophageal reflux disease)   . Hypertension   . Migraines   . Type 2 diabetes mellitus ([place])     SURGICAL HISTORY: No past surgical history on  file.  SOCIAL HISTORY: Social History   Socioeconomic History  . Marital status: Single    Spouse name: Not on file  . Number of children: Not on file  . Years of education: Not on file  . Highest education level: Not on file  Occupational History  . Not on file  Tobacco Use  . Smoking status: Current Every Day Smoker    Packs/day: 1.00    Types: Cigarettes  . Smokeless tobacco: Never Used  Substance and Sexual Activity  . Alcohol use: Yes    Alcohol/week: 0.0 standard drinks    Comment: occ  . Drug use: No  . Sexual activity: Not on file  Other Topics Concern  . 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 1and half a day- smoking; 5 beers a week; no hard liqor.           Social Determinants of Health   Financial Resource Strain:   . Difficulty of Paying Living Expenses:   Food Insecurity:   . Worried About Charity fundraiser in the Last Year:   . Arboriculturist in the Last Year:   Transportation Needs:   . Film/video editor (Medical):   Marland Kitchen Lack of Transportation (Non-Medical):   Physical Activity:   . Days of Exercise per Week:   . Minutes of Exercise per Session:   Stress:   . Feeling of Stress :   Social Connections:   . Frequency of Communication with Friends and Family:   . Frequency of Social Gatherings with Friends and Family:   . Attends Religious Services:   . Active Member of Clubs or Organizations:   .  Attends Archivist Meetings:   Marland Kitchen Marital Status:   Intimate Partner Violence:   . Fear of Current or Ex-Partner:   . Emotionally Abused:   Marland Kitchen Physically Abused:   . Sexually Abused:     FAMILY HISTORY: Family History  Problem Relation Age of Onset  . Heart disease Mother   . Hypertension Mother   . COPD Father   . Hypertension Father   . Hyperlipidemia Brother   . Prostate cancer Paternal Uncle     ALLERGIES:  is  allergic to penicillins and sulfa antibiotics.  MEDICATIONS:  Current Outpatient Medications  Medication Sig Dispense Refill  . acyclovir (ZOVIRAX) 400 MG tablet TAKE 1 TABLET BY MOUTH TWICE A DAY 180 tablet 1  . albuterol (VENTOLIN HFA) 108 (90 Base) MCG/ACT inhaler INHALE 2 PUFFS INTO THE LUNGS EVERY 6 HOURS AS NEEDED FOR WHEEZING ORSHORTNESS OF BREATH 54 g 0  . atorvastatin (LIPITOR) 20 MG tablet TAKE 1 TABLET BY MOUTH ONCE DAILY IN THEEVENING 90 tablet 1  . fluticasone (FLONASE) 50 MCG/ACT nasal spray Place 1 spray into both nostrils 2 (two) times daily. 16 g 3  . fluticasone furoate-vilanterol (BREO ELLIPTA) 100-25 MCG/INH AEPB Inhale 1 puff into the lungs daily. 1 each 11  . glipiZIDE (GLUCOTROL XL) 5 MG 24 hr tablet TAKE 1 TABLET BY MOUTH ONCE A DAY WITH BREAKFAST 90 tablet 1  . hydrochlorothiazide (HYDRODIURIL) 25 MG tablet TAKE 1 TABLET BY MOUTH ONCE A DAY AS NEEDED FOR BLOOD PRESSURE 90 tablet 1  . losartan (COZAAR) 100 MG tablet TAKE 1 TABLET BY MOUTH ONCE DAILY 90 tablet 1  . metFORMIN (GLUCOPHAGE) 1000 MG tablet TAKE 1 TABLET BY MOUTH TWICE DAILY WITH MEALS FOR DIABETES 180 tablet 1  . methocarbamol (ROBAXIN) 500 MG tablet Take 1 tablet (500 mg total) by mouth every 8 (eight) hours as needed for muscle spasms. 30 tablet 0  . nystatin (MYCOSTATIN) 100000 UNIT/ML suspension Take 5 mLs (500,000 Units total) by mouth 4 (four) times daily. 60 mL 0  . omeprazole (PRILOSEC) 20 MG capsule TAKE 1 CAPSULE BY MOUTH ONCE DAILY FOR HEARTBURN 90 capsule 1   No current facility-administered medications for this visit.     Marland Kitchen  PHYSICAL EXAMINATION:   Vitals:   09/06/19 0921  BP: 129/79  Pulse: 70  Temp: 97.8 F (36.6 C)   Filed Weights   09/06/19 0921  Weight: 196 lb (88.9 kg)    Physical Exam  Constitutional: She is oriented to person, place, and time and well-developed, well-nourished, and in no distress.  HENT:  Head: Normocephalic and atraumatic.  Mouth/Throat: Oropharynx is  clear and moist. No oropharyngeal exudate.  Eyes: Pupils are equal, round, and reactive to light.  Cardiovascular: Normal rate and regular rhythm.  Pulmonary/Chest: No respiratory distress. She has no wheezes.  Clear to auscultation bilaterally.  Abdominal: Soft. Bowel sounds are normal. She exhibits no distension and no mass. There is no abdominal tenderness. There is no rebound and no guarding.  Positive for hepatomegaly.  No splenomegaly.  Musculoskeletal:        General: No tenderness or edema. Normal range of motion.     Cervical back: Normal range of motion and neck supple.  Neurological: She is alert and oriented to person, place, and time.  Skin: Skin is warm.  Psychiatric: Affect normal.     LABORATORY DATA:  I have reviewed the data as listed Lab Results  Component Value Date   WBC 7.9 08/22/2019  HGB 13.5 08/22/2019   HCT 39.4 08/22/2019   MCV 90.0 08/22/2019   PLT 106 (L) 08/22/2019   Recent Labs    01/18/19 1008 08/07/19 1043 08/22/19 1200  NA 140 136 134*  K 4.5 4.0 3.7  CL 108 100 100  CO2 21 28 22   GLUCOSE 119* 153* 211*  BUN 14 10 10   CREATININE 1.13 0.60 0.48  CALCIUM 9.5 9.6 8.7*  GFRNONAA  --   --  >60  GFRAA  --   --  >60  PROT 7.6  --  7.3  ALBUMIN 4.4  --  3.8  AST 24  --  43*  ALT 34  --  83*  ALKPHOS 135*  --  150*  BILITOT 0.5  --  0.4     DG Cervical Spine 2 or 3 views  Result Date: 08/07/2019 CLINICAL DATA:  Acute onset neck pain and upper extremity numbness. No known injury. EXAM: CERVICAL SPINE - 2-3 VIEW COMPARISON:  None. FINDINGS: No fracture or malalignment. Straightening of lordosis noted. There is loss of disc space height at C5-6 and C6-7 with associated endplate spurring. Prevertebral soft tissues appear normal. IMPRESSION: C5-6 and C6-7 degenerative disc disease. Electronically Signed   By: Inge Rise M.D.   On: 08/07/2019 15:29   DG Lumbar Spine 2-3 Views  Result Date: 08/07/2019 CLINICAL DATA:  Acute on chronic low  back pain. EXAM: LUMBAR SPINE - 2-3 VIEW COMPARISON:  None. FINDINGS: Vertebral body height and alignment are maintained. Mild loss of disc space height is seen at L1-2 and L2-3. There is some facet arthropathy at L5-S1. Paraspinous structures demonstrate multiple gallstones and atherosclerosis. IMPRESSION: No acute finding. Mild degenerative disease. Gallstones. Atherosclerosis. Electronically Signed   By: Inge Rise M.D.   On: 08/07/2019 15:28    ASSESSMENT & PLAN:   Thrombocytopenia ([place]) #Chronic intermittent mild thrombocytopenia > 100,000 [since 2016] asymptomatic.  Patient's platelet counts 106.  HIV/hepatitis/LDH normal.  Clinically suspicious of ITP; question liver disease [see below].  #Elevated AST ALT normal bilirubin-likely fatty liver.  [Patient declined ultrasound because of financial reasons]  Discussed the potential cause of fatty liver include diabetes obesity alcohol.  Recommend weight loss improve control of diabetes/stopping alcohol.    #Oral thrush-s/p nystatin resolved.  #Reminded the patient to call us if she if she felt symptomatically worse.   # DISPOSITION: # follow up in 4 months- MD; labs- cbc/cmp/LDH- Dr.B    All questions were answered. The patient knows to call the clinic with any problems, questions or concerns.  Thank you Dr. for allowing me to participate in the care of your pleasant patient. Please do not hesitate to contact me with questions or concerns in the interim.   Cammie Sickle, MD 09/06/2019 9:57 AM   # 45 minutes face-to-face with the patient discussing the above plan of care; more than 50% of time spent on counseling and coordination.

## 2019-09-06 NOTE — Assessment & Plan Note (Addendum)
#  Chronic intermittent mild thrombocytopenia > 100,000 [since 2016] asymptomatic.  Patient's platelet counts 106.  HIV/hepatitis/LDH normal.  Clinically suspicious of ITP; question liver disease [see below].  #Elevated AST ALT normal bilirubin-likely fatty liver.  [Patient declined ultrasound because of financial reasons]  Discussed the potential cause of fatty liver include diabetes obesity alcohol.  Recommend weight loss improve control of diabetes/stopping alcohol.    #Oral thrush-s/p nystatin resolved.  #Reminded the patient to call us if she if she felt symptomatically worse.   # DISPOSITION: # follow up in 4 months- MD; labs- cbc/cmp/LDH- Dr.B

## 2019-09-10 ENCOUNTER — Ambulatory Visit: Payer: Managed Care, Other (non HMO) | Attending: Internal Medicine

## 2019-09-10 ENCOUNTER — Other Ambulatory Visit: Payer: Self-pay

## 2019-09-10 DIAGNOSIS — Z23 Encounter for immunization: Secondary | ICD-10-CM

## 2019-09-10 NOTE — Progress Notes (Signed)
   Covid-19 Vaccination Clinic  Name:  Alexandria Leblanc    MRN: [id number] DOB: 04-20-1971  09/10/2019  [name] was observed post Covid-19 immunization for 15 minutes without incident. She was provided with Vaccine Information Sheet and instruction to access the V-Safe system.   [name] was instructed to call 911 with any severe reactions post vaccine: Marland Kitchen Difficulty breathing  . Swelling of face and throat  . A fast heartbeat  . A bad rash all over body  . Dizziness and weakness   Immunizations Administered    Name Date Dose VIS Date Route   Pfizer COVID-19 Vaccine 09/10/2019 11:22 AM 0.3 mL 05/11/2019 Intramuscular   Manufacturer: [place]   Lot: [id number]   [place]: [id number]

## 2019-09-20 DIAGNOSIS — E785 Hyperlipidemia, unspecified: Secondary | ICD-10-CM

## 2019-09-20 MED ORDER — ROSUVASTATIN CALCIUM 5 MG PO TABS: 5.0000 mg | ORAL_TABLET | Freq: Every day | ORAL | 0 refills | Status: DC

## 2019-10-03 ENCOUNTER — Ambulatory Visit: Payer: Managed Care, Other (non HMO) | Attending: Internal Medicine

## 2019-10-03 DIAGNOSIS — Z23 Encounter for immunization: Secondary | ICD-10-CM

## 2019-10-03 NOTE — Progress Notes (Signed)
   Covid-19 Vaccination Clinic  Name:  STORMY MEADOWS    MRN: [id number] DOB: 1971-04-15  10/03/2019  Ms. Lomibao was observed post Covid-19 immunization for 15 minutes without incident. She was provided with Vaccine Information Sheet and instruction to access the V-Safe system.   Ms. Morvay was instructed to call 911 with any severe reactions post vaccine: Marland Kitchen Difficulty breathing  . Swelling of face and throat  . A fast heartbeat  . A bad rash all over body  . Dizziness and weakness   Immunizations Administered    Name Date Dose VIS Date Route   Pfizer COVID-19 Vaccine 10/03/2019 11:18 AM 0.3 mL 07/25/2018 Intramuscular   Manufacturer: [employer]   Lot: [id number]   [place]: [id number]

## 2019-10-11 ENCOUNTER — Other Ambulatory Visit: Payer: Self-pay | Admitting: Primary Care

## 2019-10-11 DIAGNOSIS — K219 Gastro-esophageal reflux disease without esophagitis: Secondary | ICD-10-CM

## 2019-10-11 DIAGNOSIS — E785 Hyperlipidemia, unspecified: Secondary | ICD-10-CM

## 2019-10-11 NOTE — Telephone Encounter (Signed)
There is a message through MyChart regarding rosuvastatin 5 mg

## 2019-10-11 NOTE — Telephone Encounter (Signed)
Refill(s) sent to pharmacy. See my chart message 

## 2019-10-18 ENCOUNTER — Other Ambulatory Visit: Payer: Self-pay | Admitting: Primary Care

## 2019-10-18 ENCOUNTER — Other Ambulatory Visit: Payer: Self-pay

## 2019-10-18 ENCOUNTER — Ambulatory Visit: Payer: Managed Care, Other (non HMO) | Admitting: Family Medicine

## 2019-10-18 ENCOUNTER — Ambulatory Visit: Payer: Managed Care, Other (non HMO) | Admitting: Primary Care

## 2019-10-18 ENCOUNTER — Encounter: Payer: Self-pay | Admitting: Family Medicine

## 2019-10-18 VITALS — BP 120/66 | HR 80 | Temp 98.3°F | Ht 63.0 in | Wt 197.5 lb

## 2019-10-18 DIAGNOSIS — A6000 Herpesviral infection of urogenital system, unspecified: Secondary | ICD-10-CM

## 2019-10-18 DIAGNOSIS — M25551 Pain in right hip: Secondary | ICD-10-CM | POA: Diagnosis not present

## 2019-10-18 DIAGNOSIS — M5416 Radiculopathy, lumbar region: Secondary | ICD-10-CM | POA: Diagnosis not present

## 2019-10-18 MED ORDER — CYCLOBENZAPRINE HCL 10 MG PO TABS: 5.0000 mg | ORAL_TABLET | Freq: Every day | ORAL | 1 refills | Status: DC

## 2019-10-18 MED ORDER — PREDNISONE 20 MG PO TABS
ORAL_TABLET | ORAL | 0 refills | Status: DC
Start: 2019-10-18 — End: 2019-11-15

## 2019-10-18 NOTE — Progress Notes (Signed)
Alexandria Goedecke T. Alexandria Ivan, MD, [place]  Primary Care and Sports Medicine [facility] [place], [address]  Phone: [phone]  FAX: [phone]  Alexandria Leblanc - 49 y.o. female  MRN [id number]  Date of Birth: Nov 15, 1970  Date: 10/18/2019  PCP: Pleas Koch, NP  Referral: Pleas Koch, NP  Chief Complaint  Patient presents with  . Hip Pain    Right  . Leg Pain    Right    This visit occurred during the SARS-CoV-2 public health emergency.  Safety protocols were in place, including screening questions prior to the visit, additional usage of staff PPE, and extensive cleaning of exam room while observing appropriate contact time as indicated for disinfecting solutions.   Subjective:   Alexandria Leblanc is a 49 y.o. very pleasant female patient with Body mass index is 34.99 kg/m. who presents with the following:  She is a pleasant lady, she presents with some ongoing right-sided hip pain.  Several months now, will have some oa.  For several months will have some radicular on the right.  Troubl lying down and on her back.  Spasms in her lower back.    No injury.  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  Moving anything that needed to be on.  Reports almost 40 years of some back pain.  Pain down to feet and ankle.    No groin pain.  Tylenol arthrits. Heat does not help.  Topicals have not helped.    Lab Results  Component Value Date   HGBA1C 7.2 (H) 08/07/2019    The radiological images were independently reviewed by myself in the office and results were reviewed with the patient. My independent interpretation of images: August 07, 2019 lumbar spine films, 3 views are reviewed by myself.  Electronically Signed  By: Owens Loffler, MD On: 10/18/2019  9:40 AM EDT  Review of Systems is noted in the HPI, as appropriate   Objective:   BP 120/66   Pulse 80   Temp 98.3 F (36.8 C) (Temporal)    Ht 5\' 3"  (1.6 m)   Wt 197 lb 8 oz (89.6 kg)   LMP  (LMP Unknown)   SpO2 96%   BMI 34.99 kg/m    GEN: No acute distress; alert,appropriate. PULM: Breathing comfortably in no respiratory distress PSYCH: Normally interactive.   Range of motion at  the waist: Flexion: normal Extension: normal Lateral bending: normal Rotation: all normal  No echymosis or edema Rises to examination table with no difficulty Gait: non antalgic  Inspection/Deformity: N Paraspinus Tenderness: Tenderness and tightness from L1-S1 bilaterally  B Ankle Dorsiflexion (L5,4): 5/5 B Great Toe Dorsiflexion (L5,4): 5/5 Heel Walk (L5): WNL Toe Walk (S1): WNL Rise/Squat (L4): WNL  SENSORY B Medial Foot (L4): WNL B Dorsum (L5): WNL B Lateral (S1): WNL Light Touch: WNL Pinprick: WNL  REFLEXES Knee (L4): 2+ Ankle (S1): 2+  B SLR, seated: neg B SLR, supine: neg B FABER: neg B Reverse FABER: neg B Greater Troch: NT B Log Roll: neg B Stork: NT B Sciatic Notch: NT  Radiology: DG Cervical Spine 2 or 3 views  Result Date: 08/07/2019 CLINICAL DATA:  Acute onset neck pain and upper extremity numbness. No known injury. EXAM: CERVICAL SPINE - 2-3 VIEW COMPARISON:  None. FINDINGS: No fracture or malalignment. Straightening of lordosis noted. There is loss of disc space height at C5-6 and C6-7 with associated endplate  spurring. Prevertebral soft tissues appear normal. IMPRESSION: C5-6 and C6-7 degenerative disc disease. Electronically Signed   By: Inge Rise M.D.   On: 08/07/2019 15:29   DG Lumbar Spine 2-3 Views  Result Date: 08/07/2019 CLINICAL DATA:  Acute on chronic low back pain. EXAM: LUMBAR SPINE - 2-3 VIEW COMPARISON:  None. FINDINGS: Vertebral body height and alignment are maintained. Mild loss of disc space height is seen at L1-2 and L2-3. There is some facet arthropathy at L5-S1. Paraspinous structures demonstrate multiple gallstones and atherosclerosis. IMPRESSION: No acute finding. Mild  degenerative disease. Gallstones. Atherosclerosis. Electronically Signed   By: Inge Rise M.D.   On: 08/07/2019 15:28   Assessment and Plan:     ICD-10-CM   1. Lumbar radiculopathy, acute  M54.16   2. Right hip pain  M25.551    Lumbar radiculopathy on the right with no significant neurovascular compromise.  She has also gained some weight over COVID-19 in is not particularly physically active.  A rehabilitation program from the [employer] [employer] was reviewed with the patient face to face for their condition.   Start with steroids and Flexeril with follow-up.  If symptoms persist at follow-up, formal physical therapy would be an appropriate next step.  Follow-up: Return in about 6 weeks (around 11/29/2019).  Meds ordered this encounter  Medications  . predniSONE (DELTASONE) 20 MG tablet    Sig: 2 tabs po for 7 days, then 1 tab po for 7 days    Dispense:  21 tablet    Refill:  0  . cyclobenzaprine (FLEXERIL) 10 MG tablet    Sig: Take 0.5-1 tablets (5-10 mg total) by mouth at bedtime.    Dispense:  30 tablet    Refill:  1   Medications Discontinued During This Encounter  Medication Reason  . methocarbamol (ROBAXIN) 500 MG tablet Completed Course  . nystatin (MYCOSTATIN) 100000 UNIT/ML suspension Completed Course   No orders of the defined types were placed in this encounter.   Signed,  Maud Deed. Andretta Ergle, MD   Outpatient Encounter Medications as of 10/18/2019  Medication Sig  . acyclovir (ZOVIRAX) 400 MG tablet TAKE 1 TABLET BY MOUTH TWICE A DAY  . albuterol (VENTOLIN HFA) 108 (90 Base) MCG/ACT inhaler INHALE 2 PUFFS INTO THE LUNGS EVERY 6 HOURS AS NEEDED FOR WHEEZING ORSHORTNESS OF BREATH  . fluticasone (FLONASE) 50 MCG/ACT nasal spray Place 1 spray into both nostrils 2 (two) times daily.  . fluticasone furoate-vilanterol (BREO ELLIPTA) 100-25 MCG/INH AEPB Inhale 1 puff into the lungs daily.  Marland Kitchen glipiZIDE (GLUCOTROL XL) 5 MG 24 hr tablet TAKE 1  TABLET BY MOUTH ONCE A DAY WITH BREAKFAST  . hydrochlorothiazide (HYDRODIURIL) 25 MG tablet TAKE 1 TABLET BY MOUTH ONCE A DAY AS NEEDED FOR BLOOD PRESSURE  . losartan (COZAAR) 100 MG tablet TAKE 1 TABLET BY MOUTH ONCE DAILY  . metFORMIN (GLUCOPHAGE) 1000 MG tablet TAKE 1 TABLET BY MOUTH TWICE DAILY WITH MEALS FOR DIABETES  . omeprazole (PRILOSEC) 20 MG capsule TAKE 1 CAPSULE BY MOUTH ONCE DAILY FOR HEARTBURN.  . rosuvastatin (CRESTOR) 5 MG tablet TAKE 1 TABLET BY MOUTH ONCE A DAY FOR CHOLESTEROL.  Marland Kitchen cyclobenzaprine (FLEXERIL) 10 MG tablet Take 0.5-1 tablets (5-10 mg total) by mouth at bedtime.  . predniSONE (DELTASONE) 20 MG tablet 2 tabs po for 7 days, then 1 tab po for 7 days  . [DISCONTINUED] methocarbamol (ROBAXIN) 500 MG tablet Take 1 tablet (500 mg total) by mouth every 8 (eight) hours as  needed for muscle spasms. (Patient not taking: Reported on 10/18/2019)  . [DISCONTINUED] nystatin (MYCOSTATIN) 100000 UNIT/ML suspension Take 5 mLs (500,000 Units total) by mouth 4 (four) times daily.   No facility-administered encounter medications on file as of 10/18/2019.

## 2019-11-15 ENCOUNTER — Encounter: Payer: Self-pay | Admitting: Family Medicine

## 2019-11-15 MED ORDER — PREDNISONE 20 MG PO TABS: ORAL_TABLET | ORAL | 0 refills | Status: DC

## 2019-11-22 DIAGNOSIS — J4540 Moderate persistent asthma, uncomplicated: Secondary | ICD-10-CM

## 2019-11-26 MED ORDER — ALBUTEROL SULFATE HFA 108 (90 BASE) MCG/ACT IN AERS: INHALATION_SPRAY | RESPIRATORY_TRACT | 0 refills | Status: DC

## 2019-11-29 ENCOUNTER — Ambulatory Visit: Payer: Managed Care, Other (non HMO) | Admitting: Family Medicine

## 2019-11-29 ENCOUNTER — Encounter: Payer: Self-pay | Admitting: Family Medicine

## 2019-11-29 ENCOUNTER — Other Ambulatory Visit: Payer: Self-pay

## 2019-11-29 VITALS — BP 140/80 | HR 78 | Temp 97.8°F | Ht 63.0 in | Wt 202.0 lb

## 2019-11-29 DIAGNOSIS — G8929 Other chronic pain: Secondary | ICD-10-CM

## 2019-11-29 DIAGNOSIS — M533 Sacrococcygeal disorders, not elsewhere classified: Secondary | ICD-10-CM

## 2019-11-29 DIAGNOSIS — R350 Frequency of micturition: Secondary | ICD-10-CM

## 2019-11-29 DIAGNOSIS — M549 Dorsalgia, unspecified: Secondary | ICD-10-CM

## 2019-11-29 LAB — POC URINALSYSI DIPSTICK (AUTOMATED)
Bilirubin, UA: NEGATIVE
Blood, UA: NEGATIVE
Glucose, UA: POSITIVE — AB
Ketones, UA: NEGATIVE
Leukocytes, UA: NEGATIVE
Nitrite, UA: NEGATIVE
Protein, UA: POSITIVE — AB
Spec Grav, UA: 1.015 (ref 1.010–1.025)
Urobilinogen, UA: 0.2 E.U./dL
pH, UA: 8 (ref 5.0–8.0)

## 2019-11-29 NOTE — Progress Notes (Signed)
Alexandria Nazir T. Alexandria Lemieux, MD, [place]  Primary Care and Sports Medicine [facility] [place], [address]  Phone: [phone]  FAX: [phone]  Alexandria Leblanc - 49 y.o. female  MRN [id number]  Date of Birth: Mar 06, 1971  Date: 11/29/2019  PCP: Pleas Koch, NP  Referral: Pleas Koch, NP  Chief Complaint  Patient presents with  . Follow-up    Lumbar Radiculopathy/Right Hip Pain  . Urinary Frequency    This visit occurred during the SARS-CoV-2 public health emergency.  Safety protocols were in place, including screening questions prior to the visit, additional usage of staff PPE, and extensive cleaning of exam room while observing appropriate contact time as indicated for disinfecting solutions.   Subjective:   Alexandria Leblanc is a 49 y.o. very pleasant female patient with Body mass index is 35.78 kg/m. who presents with the following:  On her last office visit I gave her some spine rehab from [facility], 14 days of steroids, Flexeril. LBP has improved, but mostly with pred.  Exercises do help. R lateral numb but went away.  In her lower extremities on the right.  Two rounds.  No numbness or tingling. Does get to the point where she cannot carry herself and both legs get tired.  Right now working at [employer].  Has done a lot of heavy lifting over time.   She does continue to have some low back pain, but this isolates more now at the [facility] joint.  She denies any neurological changes including numbness, tingling or strength difficulties.  Radicular symptoms down the leg on the right have resolved. R SI joint is the worst.   10/18/2019 Last OV with Owens Loffler, MD  She is a pleasant lady, she presents with some ongoing right-sided hip pain.  Several months now, will have some oa.  For several months will have some radicular on the right.  Troubl lying down and on her back.  Spasms in her lower back.    No  injury.  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  Moving anything that needed to be on.  Reports almost 40 years of some back pain.  Pain down to feet and ankle.    No groin pain.  Tylenol arthrits. Heat does not help.  Topicals have not helped.    Review of Systems is noted in the HPI, as appropriate   Objective:   BP 140/80   Pulse 78   Temp 97.8 F (36.6 C) (Temporal)   Ht 5\' 3"  (1.6 m)   Wt 202 lb (91.6 kg)   LMP  (LMP Unknown)   SpO2 95%   BMI 35.78 kg/m    GEN: No acute distress; alert,appropriate. PULM: Breathing comfortably in no respiratory distress PSYCH: Normally interactive.   Range of motion at  the waist: Flexion: normal Extension: normal Lateral bending: normal Rotation: all normal  No echymosis or edema Rises to examination table with no difficulty Gait: non antalgic  Inspection/Deformity: N Paraspinus Tenderness: Grossly nontender  B Ankle Dorsiflexion (L5,4): 5/5 B Great Toe Dorsiflexion (L5,4): 5/5 Heel Walk (L5): WNL Toe Walk (S1): WNL Rise/Squat (L4): WNL  SENSORY B Medial Foot (L4): WNL B Dorsum (L5): WNL B Lateral (S1): WNL Light Touch: WNL Pinprick: WNL  REFLEXES Knee (L4): 2+ Ankle (S1): 2+  B SLR, seated: neg B SLR, supine: neg B FABER: neg B Reverse FABER: Tender  B Greater Troch: NT B  Log Roll: neg B Stork: NT B Sciatic Notch: Tender to palpation  Radiology: DG Cervical Spine 2 or 3 views  Result Date: 08/07/2019 CLINICAL DATA:  Acute onset neck pain and upper extremity numbness. No known injury. EXAM: CERVICAL SPINE - 2-3 VIEW COMPARISON:  None. FINDINGS: No fracture or malalignment. Straightening of lordosis noted. There is loss of disc space height at C5-6 and C6-7 with associated endplate spurring. Prevertebral soft tissues appear normal. IMPRESSION: C5-6 and C6-7 degenerative disc disease. Electronically Signed   By: Inge Rise M.D.   On: 08/07/2019 15:29   DG Lumbar Spine 2-3 Views  Result  Date: 08/07/2019 CLINICAL DATA:  Acute on chronic low back pain. EXAM: LUMBAR SPINE - 2-3 VIEW COMPARISON:  None. FINDINGS: Vertebral body height and alignment are maintained. Mild loss of disc space height is seen at L1-2 and L2-3. There is some facet arthropathy at L5-S1. Paraspinous structures demonstrate multiple gallstones and atherosclerosis. IMPRESSION: No acute finding. Mild degenerative disease. Gallstones. Atherosclerosis. Electronically Signed   By: Inge Rise M.D.   On: 08/07/2019 15:28   Results for orders placed or performed in visit on 11/29/19  Urine Culture   Specimen: Urine  Result Value Ref Range   MICRO NUMBER: [id number]    SPECIMEN QUALITY: Adequate    Sample Source NOT GIVEN    STATUS: FINAL    ISOLATE 1:      Growth of mixed flora was isolated, suggesting probable contamination. No further testing will be performed. If clinically indicated, recollection using a method to minimize contamination, with prompt transfer to Urine Culture Transport Tube, is  recommended.   POCT Urinalysis Dipstick (Automated)  Result Value Ref Range   Color, UA Yellow    Clarity, UA Clear    Glucose, UA Positive (A) Negative   Bilirubin, UA Negative    Ketones, UA Negative    Spec Grav, UA 1.015 1.010 - 1.025   Blood, UA Negative    pH, UA 8.0 5.0 - 8.0   Protein, UA Positive (A) Negative   Urobilinogen, UA 0.2 0.2 or 1.0 E.U./dL   Nitrite, UA Negative    Leukocytes, UA Negative Negative     Assessment and Plan:     ICD-10-CM   1. Chronic right SI joint pain  M53.3    G89.29   2. Urinary frequency  R35.0 POCT Urinalysis Dipstick (Automated)    Urine Culture  3. Acute back pain, unspecified back location, unspecified back pain laterality  M54.9    She does have an ongoing primarily SI joint pain.  This is chronic in character.  Do think manipulation would help quite a bit with this.  If she is unable to see a chiropractor then physical therapy is also reasonable  step.  Increased urine frequency.  Her UA is normal.  No pain.  Urine culture equivocal.  I would not treat unless symptoms worsen, then a repeat urine culture is most appropriate.  Patient Instructions  I think seeing a chiropractor would be very helpful for the pain you are having   Check with your insurance  If they will not cover, then I think going to physical therapy would be a really good idea, too.    Follow-up: No follow-ups on file.  No orders of the defined types were placed in this encounter.  There are no discontinued medications. Orders Placed This Encounter  Procedures  . Urine Culture  . POCT Urinalysis Dipstick (Automated)    Signed,  Brinnley Lacap T. Stephie Xu,  MD   Outpatient Encounter Medications as of 11/29/2019  Medication Sig  . acyclovir (ZOVIRAX) 400 MG tablet TAKE 1 TABLET BY MOUTH TWICE A DAY  . albuterol (VENTOLIN HFA) 108 (90 Base) MCG/ACT inhaler INHALE 2 PUFFS INTO THE LUNGS EVERY 6 HOURS AS NEEDED FOR WHEEZING ORSHORTNESS OF BREATH  . cyclobenzaprine (FLEXERIL) 10 MG tablet Take 0.5-1 tablets (5-10 mg total) by mouth at bedtime.  . fluticasone (FLONASE) 50 MCG/ACT nasal spray Place 1 spray into both nostrils 2 (two) times daily.  . fluticasone furoate-vilanterol (BREO ELLIPTA) 100-25 MCG/INH AEPB Inhale 1 puff into the lungs daily.  Marland Kitchen glipiZIDE (GLUCOTROL XL) 5 MG 24 hr tablet TAKE 1 TABLET BY MOUTH ONCE A DAY WITH BREAKFAST  . hydrochlorothiazide (HYDRODIURIL) 25 MG tablet TAKE 1 TABLET BY MOUTH ONCE A DAY AS NEEDED FOR BLOOD PRESSURE  . losartan (COZAAR) 100 MG tablet TAKE 1 TABLET BY MOUTH ONCE DAILY  . metFORMIN (GLUCOPHAGE) 1000 MG tablet TAKE 1 TABLET BY MOUTH TWICE DAILY WITH MEALS FOR DIABETES  . omeprazole (PRILOSEC) 20 MG capsule TAKE 1 CAPSULE BY MOUTH ONCE DAILY FOR HEARTBURN.  . predniSONE (DELTASONE) 20 MG tablet 2 tabs po for 7 days, then 1 tab po for 7 days  . rosuvastatin (CRESTOR) 5 MG tablet TAKE 1 TABLET BY MOUTH ONCE A DAY FOR  CHOLESTEROL.   No facility-administered encounter medications on file as of 11/29/2019.

## 2019-11-29 NOTE — Patient Instructions (Signed)
I think seeing a chiropractor would be very helpful for the pain you are having   Check with your insurance  If they will not cover, then I think going to physical therapy would be a really good idea, too.

## 2019-11-30 LAB — URINE CULTURE
MICRO NUMBER:: 10658058
SPECIMEN QUALITY:: ADEQUATE

## 2019-12-01 ENCOUNTER — Encounter: Payer: Self-pay | Admitting: Family Medicine

## 2019-12-02 ENCOUNTER — Encounter: Payer: Self-pay | Admitting: Family Medicine

## 2019-12-11 ENCOUNTER — Other Ambulatory Visit: Payer: Self-pay | Admitting: Primary Care

## 2019-12-11 DIAGNOSIS — I10 Essential (primary) hypertension: Secondary | ICD-10-CM

## 2019-12-11 DIAGNOSIS — E119 Type 2 diabetes mellitus without complications: Secondary | ICD-10-CM

## 2019-12-11 MED ORDER — BLOOD GLUCOSE METER KIT: PACK | 0 refills | Status: DC

## 2020-01-03 ENCOUNTER — Inpatient Hospital Stay: Payer: Managed Care, Other (non HMO) | Admitting: Internal Medicine

## 2020-01-03 ENCOUNTER — Other Ambulatory Visit: Payer: Self-pay

## 2020-01-03 ENCOUNTER — Inpatient Hospital Stay: Payer: Managed Care, Other (non HMO) | Attending: Internal Medicine

## 2020-01-03 DIAGNOSIS — Z79899 Other long term (current) drug therapy: Secondary | ICD-10-CM | POA: Insufficient documentation

## 2020-01-03 DIAGNOSIS — Z7984 Long term (current) use of oral hypoglycemic drugs: Secondary | ICD-10-CM | POA: Insufficient documentation

## 2020-01-03 DIAGNOSIS — D696 Thrombocytopenia, unspecified: Secondary | ICD-10-CM | POA: Diagnosis not present

## 2020-01-03 DIAGNOSIS — Z7952 Long term (current) use of systemic steroids: Secondary | ICD-10-CM | POA: Diagnosis not present

## 2020-01-03 DIAGNOSIS — J45909 Unspecified asthma, uncomplicated: Secondary | ICD-10-CM | POA: Diagnosis not present

## 2020-01-03 DIAGNOSIS — Z7951 Long term (current) use of inhaled steroids: Secondary | ICD-10-CM | POA: Insufficient documentation

## 2020-01-03 DIAGNOSIS — Z8349 Family history of other endocrine, nutritional and metabolic diseases: Secondary | ICD-10-CM | POA: Diagnosis not present

## 2020-01-03 DIAGNOSIS — E119 Type 2 diabetes mellitus without complications: Secondary | ICD-10-CM | POA: Insufficient documentation

## 2020-01-03 DIAGNOSIS — K219 Gastro-esophageal reflux disease without esophagitis: Secondary | ICD-10-CM | POA: Diagnosis not present

## 2020-01-03 DIAGNOSIS — I10 Essential (primary) hypertension: Secondary | ICD-10-CM | POA: Diagnosis not present

## 2020-01-03 DIAGNOSIS — Z8249 Family history of ischemic heart disease and other diseases of the circulatory system: Secondary | ICD-10-CM | POA: Insufficient documentation

## 2020-01-03 DIAGNOSIS — F17210 Nicotine dependence, cigarettes, uncomplicated: Secondary | ICD-10-CM | POA: Diagnosis not present

## 2020-01-03 LAB — CBC WITH DIFFERENTIAL/PLATELET
Abs Immature Granulocytes: 0.05 10*3/uL (ref 0.00–0.07)
Basophils Absolute: 0 10*3/uL (ref 0.0–0.1)
Basophils Relative: 0 %
Eosinophils Absolute: 0.1 10*3/uL (ref 0.0–0.5)
Eosinophils Relative: 1 %
HCT: 40.4 % (ref 36.0–46.0)
Hemoglobin: 13.8 g/dL (ref 12.0–15.0)
Immature Granulocytes: 1 %
Lymphocytes Relative: 27 %
Lymphs Abs: 2 10*3/uL (ref 0.7–4.0)
MCH: 31.2 pg (ref 26.0–34.0)
MCHC: 34.2 g/dL (ref 30.0–36.0)
MCV: 91.4 fL (ref 80.0–100.0)
Monocytes Absolute: 0.6 10*3/uL (ref 0.1–1.0)
Monocytes Relative: 8 %
Neutro Abs: 4.7 10*3/uL (ref 1.7–7.7)
Neutrophils Relative %: 63 %
Platelets: 112 10*3/uL — ABNORMAL LOW (ref 150–400)
RBC: 4.42 MIL/uL (ref 3.87–5.11)
RDW: 12.9 % (ref 11.5–15.5)
WBC: 7.4 10*3/uL (ref 4.0–10.5)
nRBC: 0 % (ref 0.0–0.2)

## 2020-01-03 LAB — COMPREHENSIVE METABOLIC PANEL
ALT: 32 U/L (ref 0–44)
AST: 28 U/L (ref 15–41)
Albumin: 3.7 g/dL (ref 3.5–5.0)
Alkaline Phosphatase: 117 U/L (ref 38–126)
Anion gap: 11 (ref 5–15)
BUN: 14 mg/dL (ref 6–20)
CO2: 24 mmol/L (ref 22–32)
Calcium: 8.7 mg/dL — ABNORMAL LOW (ref 8.9–10.3)
Chloride: 97 mmol/L — ABNORMAL LOW (ref 98–111)
Creatinine, Ser: 0.42 mg/dL — ABNORMAL LOW (ref 0.44–1.00)
GFR calc Af Amer: >60 mL/min (ref >60)
GFR calc non Af Amer: >60 mL/min (ref >60)
Glucose, Bld: 208 mg/dL — ABNORMAL HIGH (ref 70–99)
Potassium: 3.9 mmol/L (ref 3.5–5.1)
Sodium: 132 mmol/L — ABNORMAL LOW (ref 135–145)
Total Bilirubin: 0.8 mg/dL (ref 0.3–1.2)
Total Protein: 7.4 g/dL (ref 6.5–8.1)

## 2020-01-03 LAB — LACTATE DEHYDROGENASE: LDH: 103 U/L (ref 98–192)

## 2020-01-03 NOTE — Progress Notes (Signed)
[place] CONSULT NOTE  Patient Care Team: Pleas Koch, NP as PCP - General (Internal Medicine)  CHIEF COMPLAINTS/PURPOSE OF CONSULTATION: Thrombocytopenia  HEMATOLOGY HISTORY  # THROMBOCYTOPENIA [since 2016] 110-170; normal white count hemoglobin; asymptomatic; hepatitis HIV-negative; LDH normal.  April 2021-ultrasound abdomen not done-financial reasons  #Diabetes/ alcohol abuse/smoker  HISTORY OF PRESENTING ILLNESS:   Alexandria Leblanc 49 y.o.  female pleasant patient is here for follow-up for thrombocytopenia.  Patient states that she has cut down alcohol.  However she continues to drink.  Moderately.  No nausea no vomiting.  Chronic mild diarrhea.  Not any worse.  Continues to have fatigue.   Review of Systems  Constitutional: Positive for malaise/fatigue. Negative for chills, diaphoresis, fever and weight loss.  HENT: Negative for nosebleeds and sore throat.   Eyes: Negative for double vision.  Respiratory: Negative for cough, hemoptysis, sputum production, shortness of breath and wheezing.   Cardiovascular: Negative for chest pain, palpitations, orthopnea and leg swelling.  Gastrointestinal: Positive for diarrhea (metformin). Negative for abdominal pain, blood in stool, constipation, heartburn, melena, nausea and vomiting.  Genitourinary: Negative for dysuria, frequency and urgency.  Musculoskeletal: Negative for back pain and joint pain.  Skin: Negative.  Negative for itching and rash.  Neurological: Positive for headaches. Negative for dizziness, tingling, focal weakness and weakness.  Endo/Heme/Allergies: Does not bruise/bleed easily.  Psychiatric/Behavioral: Negative for depression. The patient is not nervous/anxious and does not have insomnia.      MEDICAL HISTORY:  Past Medical History:  Diagnosis Date  . Asthma   . Genital warts   . GERD (gastroesophageal reflux disease)   . Hypertension   . Migraines   . Type 2 diabetes mellitus ([place])      SURGICAL HISTORY: No past surgical history on file.  SOCIAL HISTORY: Social History   Socioeconomic History  . Marital status: Single    Spouse name: Not on file  . Number of children: Not on file  . Years of education: Not on file  . Highest education level: Not on file  Occupational History  . Not on file  Tobacco Use  . Smoking status: Current Every Day Smoker    Packs/day: 1.00    Types: Cigarettes  . Smokeless tobacco: Never Used  Substance and Sexual Activity  . Alcohol use: Yes    Alcohol/week: 0.0 standard drinks    Comment: occ  . Drug use: No  . Sexual activity: Not on file  Other Topics Concern  . 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 1and half a day- smoking; 5 beers a week; no hard liqor.           Social Determinants of Health   Financial Resource Strain:   . Difficulty of Paying Living Expenses:   Food Insecurity:   . Worried About Charity fundraiser in the Last Year:   . Arboriculturist in the Last Year:   Transportation Needs:   . Film/video editor (Medical):   Marland Kitchen Lack of Transportation (Non-Medical):   Physical Activity:   . Days of Exercise per Week:   . Minutes of Exercise per Session:   Stress:   . Feeling of Stress :   Social Connections:   . Frequency of Communication with Friends and Family:   . Frequency of Social Gatherings with Friends and Family:   . Attends  Religious Services:   . Active Member of Clubs or Organizations:   . Attends Archivist Meetings:   Marland Kitchen Marital Status:   Intimate Partner Violence:   . Fear of Current or Ex-Partner:   . Emotionally Abused:   Marland Kitchen Physically Abused:   . Sexually Abused:     FAMILY HISTORY: Family History  Problem Relation Age of Onset  . Heart disease Mother   . Hypertension Mother   . COPD Father   . Hypertension Father   . Hyperlipidemia Brother   .  Prostate cancer Paternal Uncle     ALLERGIES:  is allergic to penicillins and sulfa antibiotics.  MEDICATIONS:  Current Outpatient Medications  Medication Sig Dispense Refill  . acyclovir (ZOVIRAX) 400 MG tablet TAKE 1 TABLET BY MOUTH TWICE A DAY 180 tablet 1  . albuterol (VENTOLIN HFA) 108 (90 Base) MCG/ACT inhaler INHALE 2 PUFFS INTO THE LUNGS EVERY 6 HOURS AS NEEDED FOR WHEEZING ORSHORTNESS OF BREATH 54 g 0  . blood glucose meter kit and supplies Dispense based on patient and insurance preference. Use up to 2 times daily as directed. (FOR ICD-10 E10.9, E11.9). 1 each 0  . cyclobenzaprine (FLEXERIL) 10 MG tablet Take 0.5-1 tablets (5-10 mg total) by mouth at bedtime. 30 tablet 1  . fluticasone (FLONASE) 50 MCG/ACT nasal spray Place 1 spray into both nostrils 2 (two) times daily. 16 g 3  . fluticasone furoate-vilanterol (BREO ELLIPTA) 100-25 MCG/INH AEPB Inhale 1 puff into the lungs daily. 1 each 11  . glipiZIDE (GLUCOTROL XL) 5 MG 24 hr tablet TAKE 1 TABLET BY MOUTH ONCE A DAY WITH BREAKFAST 90 tablet 1  . hydrochlorothiazide (HYDRODIURIL) 25 MG tablet TAKE 1 TABLET BY MOUTH ONCE A DAY AS NEEDED FOR BLOOD PRESSURE 90 tablet 1  . losartan (COZAAR) 100 MG tablet TAKE 1 TABLET BY MOUTH ONCE DAILY 90 tablet 1  . metFORMIN (GLUCOPHAGE) 1000 MG tablet TAKE 1 TABLET BY MOUTH TWICE DAILY WITH MEALS FOR DIABETES 180 tablet 1  . omeprazole (PRILOSEC) 20 MG capsule TAKE 1 CAPSULE BY MOUTH ONCE DAILY FOR HEARTBURN. 90 capsule 1  . predniSONE (DELTASONE) 20 MG tablet 2 tabs po for 7 days, then 1 tab po for 7 days 21 tablet 0  . rosuvastatin (CRESTOR) 5 MG tablet TAKE 1 TABLET BY MOUTH ONCE A DAY FOR CHOLESTEROL. 90 tablet 3   No current facility-administered medications for this visit.     Marland Kitchen  PHYSICAL EXAMINATION:   Vitals:   01/03/20 1004  BP: (!) 148/78  Pulse: 73  Temp: 97.9 F (36.6 C)  SpO2: 96%   Filed Weights   01/03/20 1004  Weight: 195 lb 12.8 oz (88.8 kg)    Physical  Exam HENT:     Head: Normocephalic and atraumatic.     Mouth/Throat:     Pharynx: No oropharyngeal exudate.  Eyes:     Pupils: Pupils are equal, round, and reactive to light.  Cardiovascular:     Rate and Rhythm: Normal rate and regular rhythm.  Pulmonary:     Effort: No respiratory distress.     Breath sounds: No wheezing.  Abdominal:     General: Bowel sounds are normal. There is no distension.     Palpations: Abdomen is soft. There is no mass.     Tenderness: There is no abdominal tenderness. There is no guarding or rebound.     Comments: Positive for hepatomegaly.  No splenomegaly.  Musculoskeletal:  General: No tenderness. Normal range of motion.     Cervical back: Normal range of motion and neck supple.  Skin:    General: Skin is warm.  Neurological:     Mental Status: She is alert and oriented to person, place, and time.  Psychiatric:        Mood and Affect: Affect normal.      LABORATORY DATA:  I have reviewed the data as listed Lab Results  Component Value Date   WBC 7.4 01/03/2020   HGB 13.8 01/03/2020   HCT 40.4 01/03/2020   MCV 91.4 01/03/2020   PLT 112 (L) 01/03/2020   Recent Labs    01/18/19 1008 01/18/19 1008 08/07/19 1043 08/22/19 1200 01/03/20 0940  NA 140   < > 136 134* 132*  K 4.5   < > 4.0 3.7 3.9  CL 108   < > 100 100 97*  CO2 21   < > _0 GLUCOSE 119*   < > 153* 211* 208*  BUN 14   < > _1 CREATININE 1.13   < > 0.60 0.48 0.42*  CALCIUM 9.5   < > 9.6 8.7* 8.7*  GFRNONAA  --   --   --  >60 >60  GFRAA  --   --   --  >60 >60  PROT 7.6  --   --  7.3 7.4  ALBUMIN 4.4  --   --  3.8 3.7  AST 24  --   --  43* 28  ALT 34  --   --  83* 32  ALKPHOS 135*  --   --  150* 117  BILITOT 0.5  --   --  0.4 0.8   < > = values in this interval not displayed.     No results found.  ASSESSMENT & PLAN:   Thrombocytopenia ([place]) #Chronic intermittent mild thrombocytopenia > 100,000 [since 2016] asymptomatic.  Patient's platelet  counts 106.  HIV/hepatitis/LDH normal.  Clinically suspicious of ITP; question liver disease [see below].  # Pt asymptomatic; continue surveillance every 6 months.  However recommend follow up with PCP for now.  Patient could be referred back to [place] if platelets less than 100/trending down.  # fatigue-alcohol/sleep apnea versus others.  Defer to PCP.   #Elevated AST ALT normal bilirubin-likely fatty liver.  [Patient declined ultrasound because of financial reasons]  Improved.   # DISPOSITION: # follow up as needed- Dr.B  Cc; PCP    All questions were answered. The patient knows to call the clinic with any problems, questions or concerns.  Thank you Dr. for allowing me to participate in the care of your pleasant patient. Please do not hesitate to contact me with questions or concerns in the interim.   Cammie Sickle, MD 01/03/2020 12:19 PM   # 45 minutes face-to-face with the patient discussing the above plan of care; more than 50% of time spent on counseling and coordination.

## 2020-01-03 NOTE — Assessment & Plan Note (Addendum)
#  Chronic intermittent mild thrombocytopenia > 100,000 [since 2016] asymptomatic.  Patient's platelet counts 106.  HIV/hepatitis/LDH normal.  Clinically suspicious of ITP; question liver disease [see below].  # Pt asymptomatic; continue surveillance every 6 months.  However recommend follow up with PCP for now.  Patient could be referred back to [place] if platelets less than 100/trending down.  # fatigue-alcohol/sleep apnea versus others.  Defer to PCP.   #Elevated AST ALT normal bilirubin-likely fatty liver.  [Patient declined ultrasound because of financial reasons]  Improved.   # DISPOSITION: # follow up as needed- Dr.B  Cc; PCP

## 2020-02-08 ENCOUNTER — Other Ambulatory Visit: Payer: Self-pay | Admitting: Primary Care

## 2020-02-08 DIAGNOSIS — I10 Essential (primary) hypertension: Secondary | ICD-10-CM

## 2020-02-08 DIAGNOSIS — E119 Type 2 diabetes mellitus without complications: Secondary | ICD-10-CM

## 2020-03-27 NOTE — Telephone Encounter (Signed)
Alexandria Leblanc, this patient receives free inhalers from [place] manufacturer for her Breo inhaler. Can you take a look, see if there is any evidence of this being completed?

## 2020-03-28 ENCOUNTER — Other Ambulatory Visit: Payer: Self-pay

## 2020-03-28 DIAGNOSIS — J4540 Moderate persistent asthma, uncomplicated: Secondary | ICD-10-CM

## 2020-03-28 MED ORDER — FLUTICASONE FUROATE-VILANTEROL 100-25 MCG/INH IN AEPB: 1.0000 | INHALATION_SPRAY | Freq: Every day | RESPIRATORY_TRACT | 3 refills | Status: DC

## 2020-03-28 NOTE — Telephone Encounter (Signed)
Called [place] at [phone]. Per rep she has been denied due to having private insurance for the Breo and the Ventolin has been taking off the patient assistance list. Have called and l/m for pt to call office.

## 2020-03-29 NOTE — Telephone Encounter (Signed)
Noted  

## 2020-04-07 ENCOUNTER — Other Ambulatory Visit: Payer: Self-pay | Admitting: Primary Care

## 2020-04-07 DIAGNOSIS — K219 Gastro-esophageal reflux disease without esophagitis: Secondary | ICD-10-CM

## 2020-04-21 ENCOUNTER — Other Ambulatory Visit: Payer: Self-pay | Admitting: Primary Care

## 2020-04-21 DIAGNOSIS — A6000 Herpesviral infection of urogenital system, unspecified: Secondary | ICD-10-CM

## 2020-06-03 ENCOUNTER — Other Ambulatory Visit: Payer: Self-pay | Admitting: Primary Care

## 2020-06-03 DIAGNOSIS — I10 Essential (primary) hypertension: Secondary | ICD-10-CM

## 2020-06-04 NOTE — Telephone Encounter (Signed)
Last OV 08/07/19 Last fill 12/11/19  #90/1

## 2020-06-18 ENCOUNTER — Other Ambulatory Visit: Payer: Self-pay | Admitting: Primary Care

## 2020-06-18 DIAGNOSIS — A6000 Herpesviral infection of urogenital system, unspecified: Secondary | ICD-10-CM

## 2020-06-26 ENCOUNTER — Other Ambulatory Visit: Payer: Self-pay | Admitting: Primary Care

## 2020-06-26 DIAGNOSIS — E119 Type 2 diabetes mellitus without complications: Secondary | ICD-10-CM

## 2020-07-07 ENCOUNTER — Other Ambulatory Visit: Payer: Self-pay

## 2020-07-07 ENCOUNTER — Encounter: Payer: Self-pay | Admitting: Family Medicine

## 2020-07-07 ENCOUNTER — Ambulatory Visit: Payer: Managed Care, Other (non HMO) | Admitting: Family Medicine

## 2020-07-07 VITALS — BP 160/76 | HR 76 | Temp 97.6°F | Ht 63.0 in | Wt 194.8 lb

## 2020-07-07 DIAGNOSIS — M549 Dorsalgia, unspecified: Secondary | ICD-10-CM

## 2020-07-07 DIAGNOSIS — M533 Sacrococcygeal disorders, not elsewhere classified: Secondary | ICD-10-CM

## 2020-07-07 DIAGNOSIS — R3915 Urgency of urination: Secondary | ICD-10-CM

## 2020-07-07 DIAGNOSIS — G8929 Other chronic pain: Secondary | ICD-10-CM

## 2020-07-07 LAB — POC URINALSYSI DIPSTICK (AUTOMATED)
Bilirubin, UA: NEGATIVE
Blood, UA: NEGATIVE
Glucose, UA: NEGATIVE
Ketones, UA: NEGATIVE
Leukocytes, UA: NEGATIVE
Nitrite, UA: NEGATIVE
Protein, UA: NEGATIVE
Spec Grav, UA: 1.01 (ref 1.010–1.025)
Urobilinogen, UA: 0.2 E.U./dL
pH, UA: 8.5 — AB (ref 5.0–8.0)

## 2020-07-07 MED ORDER — PREDNISONE 20 MG PO TABS: ORAL_TABLET | ORAL | 0 refills | Status: DC

## 2020-07-07 NOTE — Progress Notes (Signed)
Alexandria Kenedy T. Isai Gottlieb, MD, [place]  Primary Care and Sports Medicine [facility] at [facility] [place], [address]  Phone: [phone]  FAX: [phone]  Alexandria Leblanc - 50 y.o. female  MRN [id number]  Date of Birth: 11-24-1970  Date: 07/07/2020  PCP: Pleas Koch, NP  Referral: Pleas Koch, NP  Chief Complaint  Patient presents with  . Back Pain    Lower Back    This visit occurred during the SARS-CoV-2 public health emergency.  Safety protocols were in place, including screening questions prior to the visit, additional usage of staff PPE, and extensive cleaning of exam room while observing appropriate contact time as indicated for disinfecting solutions.   Subjective:   Alexandria Leblanc is a 50 y.o. very pleasant female patient with Body mass index is 34.5 kg/m. who presents with the following:  She is a 50 year old with a history of LBP, SI joint pain.  I saw her a couple of times in early 2021.  She has had 2 rounds of steroids and did a HEP.  She returns today.  She did have some mild DDD only on her 2021 lumbar spine series.   This is more intense in the morning.  Could barely walk in the morning going to the bathroom.   Has not really gotten any better.  Yesterday did have some arm pain. Went away.   At this point she has done some occ HEP without success.  Pt referral    11/29/2019 Last OV with Owens Loffler, MD  On her last office visit I gave her some spine rehab from [facility], 14 days of steroids, Flexeril. LBP has improved, but mostly with pred.  Exercises do help. R lateral numb but went away.  In her lower extremities on the right.   Two rounds.  No numbness or tingling. Does get to the point where she cannot carry herself and both legs get tired.  Right now working at [employer].  Has done a lot of heavy lifting over time.    She does continue to have some low back pain, but this isolates more now  at the [facility] joint.  She denies any neurological changes including numbness, tingling or strength difficulties.  Radicular symptoms down the leg on the right have resolved. R SI joint is the worst.    10/18/2019 Last OV with Owens Loffler, MD  She is a pleasant lady, she presents with some ongoing right-sided hip pain.   Several months now, will have some oa.  For several months will have some radicular on the right.  Troubl lying down and on her back.  Spasms in her lower back.     No injury.  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  Moving anything that needed to be on.  Reports almost 40 years of some back pain.   Pain down to feet and ankle.     No groin pain.   Tylenol arthrits. Heat does not help.  Topicals have not helped.     Review of Systems is noted in the HPI, as appropriate   Objective:   BP (!) 160/76   Pulse 76   Temp 97.6 F (36.4 C) (Temporal)   Ht $R'5\' 3"'KP$  (1.6 m)   Wt 194 lb 12 oz (88.3 kg)   LMP  (LMP Unknown)   SpO2 96%   BMI 34.50 kg/m    Range of motion  at  the waist: Flexion, extension, lateral bending and rotation: Notable restriction of motion in flexion and extension without loss of motion and lateral bending and rotational movements.    No echymosis or edema Rises to examination table with mild difficulty Gait: minimally antalgic  Inspection/Deformity: N Paraspinus Tenderness: Tenderness bilaterally from L2-S1  B Ankle Dorsiflexion (L5,4): 5/5 B Great Toe Dorsiflexion (L5,4): 5/5 Heel Walk (L5): WNL Toe Walk (S1): WNL Rise/Squat (L4): WNL, mild pain  SENSORY B Medial Foot (L4): WNL B Dorsum (L5): WNL B Lateral (S1): WNL Light Touch: WNL Pinprick: WNL  REFLEXES Knee (L4): 2+ Ankle (S1): 2+  B SLR, seated: neg B SLR, supine: neg B FABER: neg B Reverse FABER: neg B Greater Troch: NT B Log Roll: neg B Stork: NT B Sciatic Notch: NT  Radiology: CLINICAL DATA:  Acute on chronic low back pain.  EXAM: LUMBAR SPINE - 2-3  VIEW  COMPARISON:  None.  FINDINGS: Vertebral body height and alignment are maintained. Mild loss of disc space height is seen at L1-2 and L2-3. There is some facet arthropathy at L5-S1. Paraspinous structures demonstrate multiple gallstones and atherosclerosis.  IMPRESSION: No acute finding.  Mild degenerative disease.  Gallstones.  Atherosclerosis.   Electronically Signed   By: Inge Rise M.D.   On: 08/07/2019 15:28  Results for orders placed or performed in visit on 07/07/20  POCT Urinalysis Dipstick (Automated)  Result Value Ref Range   Color, UA Yellow    Clarity, UA Clear    Glucose, UA Negative Negative   Bilirubin, UA Negative    Ketones, UA Negative    Spec Grav, UA 1.010 1.010 - 1.025   Blood, UA Negative    pH, UA 8.5 (A) 5.0 - 8.0   Protein, UA Negative Negative   Urobilinogen, UA 0.2 0.2 or 1.0 E.U./dL   Nitrite, UA Negative    Leukocytes, UA Negative Negative     Assessment and Plan:     ICD-10-CM   1. Acute back pain, unspecified back location, unspecified back pain laterality  M54.9 Ambulatory referral to Physical Therapy  2. Chronic right SI joint pain  M53.3 Ambulatory referral to Physical Therapy   G89.29   3. Urinary urgency  R39.15 POCT Urinalysis Dipstick (Automated)   Acute on chronic back pain with exacerbation.  She has not done well and now with a recurrence of symptoms.  Think that formal physical therapy is an appropriate next step, and I am also going to pulse her with some steroids.  Hopefully this will help and with improved function will decrease her back pain overall.  The patient was worried that she had a urinary tract infection.  Urinalysis is normal.  Reassurance.  Meds ordered this encounter  Medications  . predniSONE (DELTASONE) 20 MG tablet    Sig: 2 tabs po for 7 days, then 1 tab po for 7 days    Dispense:  21 tablet    Refill:  0   Medications Discontinued During This Encounter  Medication Reason  .  predniSONE (DELTASONE) 20 MG tablet    Orders Placed This Encounter  Procedures  . Ambulatory referral to Physical Therapy  . POCT Urinalysis Dipstick (Automated)    Follow-up: No follow-ups on file.  Signed,  Maud Deed. Cydni Reddoch, MD   Outpatient Encounter Medications as of 07/07/2020  Medication Sig  . acyclovir (ZOVIRAX) 400 MG tablet TAKE 1 TABLET BY MOUTH TWICE A DAY  . albuterol (VENTOLIN HFA) 108 (90 Base) MCG/ACT inhaler INHALE  2 PUFFS INTO THE LUNGS EVERY 6 HOURS AS NEEDED FOR WHEEZING ORSHORTNESS OF BREATH  . blood glucose meter kit and supplies Dispense based on patient and insurance preference. Use up to 2 times daily as directed. (FOR ICD-10 E10.9, E11.9).  . cyclobenzaprine (FLEXERIL) 10 MG tablet Take 0.5-1 tablets (5-10 mg total) by mouth at bedtime.  . fluticasone (FLONASE) 50 MCG/ACT nasal spray Place 1 spray into both nostrils 2 (two) times daily.  . fluticasone furoate-vilanterol (BREO ELLIPTA) 100-25 MCG/INH AEPB Inhale 1 puff into the lungs daily.  Marland Kitchen glipiZIDE (GLUCOTROL XL) 5 MG 24 hr tablet TAKE 1 TABLET BY MOUTH ONCE A DAY WITH BREAKFAST  . hydrochlorothiazide (HYDRODIURIL) 25 MG tablet TAKE 1 TABLET BY MOUTH ONCE A DAY AS NEEDED FOR BLOOD PRESSURE  . losartan (COZAAR) 100 MG tablet TAKE 1 TABLET BY MOUTH ONCE DAILY  . metFORMIN (GLUCOPHAGE) 1000 MG tablet TAKE 1 TABLET BY MOUTH TWICE DAILY WITH MEALS FOR DIABETES  . omeprazole (PRILOSEC) 20 MG capsule TAKE 1 CAPSULE BY MOUTH ONCE DAILY FOR HEARTBURN.  . predniSONE (DELTASONE) 20 MG tablet 2 tabs po for 7 days, then 1 tab po for 7 days  . rosuvastatin (CRESTOR) 5 MG tablet TAKE 1 TABLET BY MOUTH ONCE A DAY FOR CHOLESTEROL.  . [DISCONTINUED] predniSONE (DELTASONE) 20 MG tablet 2 tabs po for 7 days, then 1 tab po for 7 days   No facility-administered encounter medications on file as of 07/07/2020.

## 2020-07-22 ENCOUNTER — Other Ambulatory Visit: Payer: Self-pay | Admitting: Primary Care

## 2020-07-22 DIAGNOSIS — A6000 Herpesviral infection of urogenital system, unspecified: Secondary | ICD-10-CM

## 2020-07-22 DIAGNOSIS — E119 Type 2 diabetes mellitus without complications: Secondary | ICD-10-CM

## 2020-07-30 ENCOUNTER — Ambulatory Visit: Payer: Managed Care, Other (non HMO) | Admitting: Primary Care

## 2020-07-30 ENCOUNTER — Encounter: Payer: Self-pay | Admitting: Primary Care

## 2020-07-30 ENCOUNTER — Other Ambulatory Visit: Payer: Self-pay

## 2020-07-30 VITALS — BP 150/82 | HR 80 | Temp 97.4°F | Ht 63.0 in | Wt 185.0 lb

## 2020-07-30 DIAGNOSIS — A6000 Herpesviral infection of urogenital system, unspecified: Secondary | ICD-10-CM

## 2020-07-30 DIAGNOSIS — Z23 Encounter for immunization: Secondary | ICD-10-CM

## 2020-07-30 DIAGNOSIS — M5442 Lumbago with sciatica, left side: Secondary | ICD-10-CM

## 2020-07-30 DIAGNOSIS — J302 Other seasonal allergic rhinitis: Secondary | ICD-10-CM

## 2020-07-30 DIAGNOSIS — G8929 Other chronic pain: Secondary | ICD-10-CM

## 2020-07-30 DIAGNOSIS — K219 Gastro-esophageal reflux disease without esophagitis: Secondary | ICD-10-CM

## 2020-07-30 DIAGNOSIS — N898 Other specified noninflammatory disorders of vagina: Secondary | ICD-10-CM | POA: Insufficient documentation

## 2020-07-30 DIAGNOSIS — E785 Hyperlipidemia, unspecified: Secondary | ICD-10-CM | POA: Diagnosis not present

## 2020-07-30 DIAGNOSIS — M5441 Lumbago with sciatica, right side: Secondary | ICD-10-CM

## 2020-07-30 DIAGNOSIS — D696 Thrombocytopenia, unspecified: Secondary | ICD-10-CM | POA: Diagnosis not present

## 2020-07-30 DIAGNOSIS — R519 Headache, unspecified: Secondary | ICD-10-CM

## 2020-07-30 DIAGNOSIS — J4540 Moderate persistent asthma, uncomplicated: Secondary | ICD-10-CM | POA: Diagnosis not present

## 2020-07-30 DIAGNOSIS — I10 Essential (primary) hypertension: Secondary | ICD-10-CM

## 2020-07-30 DIAGNOSIS — E119 Type 2 diabetes mellitus without complications: Secondary | ICD-10-CM

## 2020-07-30 LAB — POCT GLYCOSYLATED HEMOGLOBIN (HGB A1C): Hemoglobin A1C: 11.2 % — AB (ref 4.0–5.6)

## 2020-07-30 LAB — POC URINALSYSI DIPSTICK (AUTOMATED)
Bilirubin, UA: NEGATIVE
Blood, UA: NEGATIVE
Glucose, UA: POSITIVE — AB
Ketones, UA: NEGATIVE
Leukocytes, UA: NEGATIVE
Nitrite, UA: NEGATIVE
Protein, UA: POSITIVE — AB
Spec Grav, UA: 1.015 (ref 1.010–1.025)
Urobilinogen, UA: 0.2 E.U./dL
pH, UA: 7 (ref 5.0–8.0)

## 2020-07-30 LAB — CBC
HCT: 39.2 % (ref 36.0–46.0)
Hemoglobin: 13.5 g/dL (ref 12.0–15.0)
MCHC: 34.3 g/dL (ref 30.0–36.0)
MCV: 93.6 fl (ref 78.0–100.0)
Platelets: 88 10*3/uL — ABNORMAL LOW (ref 150.0–400.0)
RBC: 4.19 Mil/uL (ref 3.87–5.11)
RDW: 13.7 % (ref 11.5–15.5)
WBC: 5.7 10*3/uL (ref 4.0–10.5)

## 2020-07-30 LAB — COMPREHENSIVE METABOLIC PANEL
ALT: 52 U/L — ABNORMAL HIGH (ref 0–35)
AST: 41 U/L — ABNORMAL HIGH (ref 0–37)
Albumin: 3.7 g/dL (ref 3.5–5.2)
Alkaline Phosphatase: 174 U/L — ABNORMAL HIGH (ref 39–117)
BUN: 10 mg/dL (ref 6–23)
CO2: 26 mEq/L (ref 19–32)
Calcium: 9.3 mg/dL (ref 8.4–10.5)
Chloride: 95 mEq/L — ABNORMAL LOW (ref 96–112)
Creatinine, Ser: 0.54 mg/dL (ref 0.40–1.20)
GFR: 107.65 mL/min (ref >60.00)
Glucose, Bld: 319 mg/dL — ABNORMAL HIGH (ref 70–99)
Potassium: 4 mEq/L (ref 3.5–5.1)
Sodium: 130 mEq/L — ABNORMAL LOW (ref 135–145)
Total Bilirubin: 0.7 mg/dL (ref 0.2–1.2)
Total Protein: 7.5 g/dL (ref 6.0–8.3)

## 2020-07-30 LAB — LIPID PANEL
Cholesterol: 311 mg/dL — ABNORMAL HIGH (ref 0–200)
HDL: 28.7 mg/dL — ABNORMAL LOW (ref >39.00)
Total CHOL/HDL Ratio: 11
Triglycerides: 700 mg/dL — ABNORMAL HIGH (ref 0.0–149.0)

## 2020-07-30 LAB — LDL CHOLESTEROL, DIRECT: Direct LDL: 87 mg/dL

## 2020-07-30 MED ORDER — GLIPIZIDE ER 10 MG PO TB24: 10.0000 mg | ORAL_TABLET | Freq: Every day | ORAL | 3 refills | Status: DC

## 2020-07-30 MED ORDER — TRULICITY 0.75 MG/0.5ML ~~LOC~~ SOAJ: 0.7500 mg | SUBCUTANEOUS | 0 refills | Status: DC

## 2020-07-30 MED ORDER — METFORMIN HCL 1000 MG PO TABS: ORAL_TABLET | ORAL | 3 refills | Status: DC

## 2020-07-30 MED ORDER — FLUTICASONE FUROATE-VILANTEROL 100-25 MCG/INH IN AEPB: 1.0000 | INHALATION_SPRAY | Freq: Every day | RESPIRATORY_TRACT | 3 refills | Status: DC

## 2020-07-30 MED ORDER — ACYCLOVIR 400 MG PO TABS: 400.0000 mg | ORAL_TABLET | Freq: Two times a day (BID) | ORAL | 3 refills | Status: DC

## 2020-07-30 MED ORDER — FLUTICASONE PROPIONATE 50 MCG/ACT NA SUSP: 1.0000 | Freq: Two times a day (BID) | NASAL | 3 refills | Status: DC

## 2020-07-30 MED ORDER — OLMESARTAN MEDOXOMIL 20 MG PO TABS: 20.0000 mg | ORAL_TABLET | Freq: Every day | ORAL | 0 refills | Status: DC

## 2020-07-30 NOTE — Assessment & Plan Note (Addendum)
Uncontrolled with A1c of 11.2 today.  Poor diet and no regular exercise.  Continue Metformin 1000 mg twice daily. Increase glipizide to 10 mg XL daily. Add Trulicity 1.66 mg weekly.  Discussed the absolute need to improve her diet, need for weight loss in order to improve diabetes and symptoms.  She will continue to monitor her glucose levels, increased frequency of monitoring.  Follow-up in 3 months, or sooner if hyperglycemia persists

## 2020-07-30 NOTE — Assessment & Plan Note (Addendum)
Continued, also with chronic hypertension that has been uncontrolled.  Also with A1c of 11.2 today.  We will first gain better control blood pressure, and if no improvement then we will proceed with daily headache preventative treatment.  Strong family history of migraines and headaches.

## 2020-07-30 NOTE — Assessment & Plan Note (Signed)
Doing well on acyclovir 400 mg BID, continue same.

## 2020-07-30 NOTE — Assessment & Plan Note (Signed)
Uncontrolled in the office today, also on prior visits and home readings.  Losartan is now on national back order, will switch to olmesartan 20 mg daily, continue HCTZ 25 mg daily.  Discussed to monitor blood pressure at home, report if readings are consistently at or above 130/90.  CMP pending.

## 2020-07-30 NOTE — Patient Instructions (Addendum)
Stop by the lab prior to leaving today. I will notify you of your results once received.   Stop taking losartan 100 mg for blood pressure. Start taking olmesartan 20 mg once daily for blood pressure. Continue taking hydrochlorothiazide 25 mg daily for blood pressure.  Monitor your blood pressure, notify me if you continue to see readings at or above 130 on top and at or above 90 on bottom.  Start Trulicity 1.49 mg weekly for diabetes. We increase the dose of your glipizide to XL 10 mg daily. Continue Metformin 1000 mg twice daily.  Start checking your blood sugar levels.  Appropriate times to check your blood sugar levels are:  -Before any meal (breakfast, lunch, dinner) -Two hours after any meal (breakfast, lunch, dinner) -Bedtime  Record your readings and notify me if you continue to consistently run at or above 200 after a few weeks.  Please schedule a follow up appointment in 3 months for diabetes.  It was a pleasure to see you today!     Influenza (Flu) Vaccine (Inactivated or Recombinant): What You Need to Know 1. Why get vaccinated? Influenza vaccine can prevent influenza (flu). Flu is a contagious disease that spreads around the [place] every year, usually between October and May. Anyone can get the flu, but it is more dangerous for some people. Infants and young children, people 23 years and older, pregnant people, and people with certain health conditions or a weakened immune system are at greatest risk of flu complications. Pneumonia, bronchitis, sinus infections, and ear infections are examples of flu-related complications. If you have a medical condition, such as heart disease, cancer, or diabetes, flu can make it worse. Flu can cause fever and chills, sore throat, muscle aches, fatigue, cough, headache, and runny or stuffy nose. Some people may have vomiting and diarrhea, though this is more common in children than adults. In an average year, thousands of people  in the [place] die from flu, and many more are hospitalized. Flu vaccine prevents millions of illnesses and flu-related visits to the doctor each year. 2. Influenza vaccines CDC recommends everyone 6 months and older get vaccinated every flu season. Children 6 months through 50 years of age may need 2 doses during a single flu season. Everyone else needs only 1 dose each flu season. It takes about 2 weeks for protection to develop after vaccination. There are many flu viruses, and they are always changing. Each year a new flu vaccine is made to protect against the influenza viruses believed to be likely to cause disease in the upcoming flu season. Even when the vaccine doesn't exactly match these viruses, it may still provide some protection. Influenza vaccine does not cause flu. Influenza vaccine may be given at the same time as other vaccines. 3. Talk with your health care provider Tell your vaccination provider if the person getting the vaccine:  Has had an allergic reaction after a previous dose of influenza vaccine, or has any severe, life-threatening allergies  Has ever had Guillain-Barr Syndrome (also called "GBS") In some cases, your health care provider may decide to postpone influenza vaccination until a future visit. Influenza vaccine can be administered at any time during pregnancy. People who are or will be pregnant during influenza season should receive inactivated influenza vaccine. People with minor illnesses, such as a cold, may be vaccinated. People who are moderately or severely ill should usually wait until they recover before getting influenza vaccine. Your health care provider can give you more  information. 4. Risks of a vaccine reaction  Soreness, redness, and swelling where the shot is given, fever, muscle aches, and headache can happen after influenza vaccination.  There may be a very small increased risk of Guillain-Barr Syndrome (GBS) after inactivated influenza  vaccine (the flu shot). Young children who get the flu shot along with pneumococcal vaccine (PCV13) and/or DTaP vaccine at the same time might be slightly more likely to have a seizure caused by fever. Tell your health care provider if a child who is getting flu vaccine has ever had a seizure. People sometimes faint after medical procedures, including vaccination. Tell your provider if you feel dizzy or have vision changes or ringing in the ears. As with any medicine, there is a very remote chance of a vaccine causing a severe allergic reaction, other serious injury, or death. 5. What if there is a serious problem? An allergic reaction could occur after the vaccinated person leaves the clinic. If you see signs of a severe allergic reaction (hives, swelling of the face and throat, difficulty breathing, a fast heartbeat, dizziness, or weakness), call 9-1-1 and get the person to the nearest hospital. For other signs that concern you, call your health care provider. Adverse reactions should be reported to the Vaccine Adverse Event Reporting System (VAERS). Your health care provider will usually file this report, or you can do it yourself. Visit the VAERS website at www.vaers.SamedayNews.es or call 239-796-4450. VAERS is only for reporting reactions, and VAERS staff members do not give medical advice. 6. The National Vaccine Injury Compensation Program The [employer] Vaccine Injury Compensation Program (VICP) is a federal program that was created to compensate people who may have been injured by certain vaccines. Claims regarding alleged injury or death due to vaccination have a time limit for filing, which may be as short as two years. Visit the VICP website at GoldCloset.com.ee or call (717)386-0801 to learn about the program and about filing a claim. 7. How can I learn more?  Ask your health care provider.  Call your local or state health department.  Visit the website of the Food and Drug  Administration (FDA) for vaccine package inserts and additional information at TraderRating.uy.  Contact the Centers for Disease Control and Prevention (CDC): ? Call 2133983430 (1-800-CDC-INFO) or ? Visit CDC's website at https://gibson.com/. Vaccine Information Statement Inactivated Influenza Vaccine (01/04/2020) This information is not intended to replace advice given to you by your health care provider. Make sure you discuss any questions you have with your health care provider. Document Revised: 02/21/2020 Document Reviewed: 02/21/2020 Elsevier Patient Education  2021 [employer].

## 2020-07-30 NOTE — Assessment & Plan Note (Signed)
Chronic and ongoing. Following with Dr. Lorelei Pont and will start physical therapy.

## 2020-07-30 NOTE — Assessment & Plan Note (Signed)
Compliant to rosuvastatin 5 mg, continue same. Repeat lipid panel pending.  

## 2020-07-30 NOTE — Assessment & Plan Note (Signed)
Doing well on omeprazole 20 mg daily, continue same.

## 2020-07-30 NOTE — Assessment & Plan Note (Signed)
Doing well on daily Breo, using albuterol infrequently when compliant to Scott daily.  Continue both.

## 2020-07-30 NOTE — Progress Notes (Signed)
Subjective:    Patient ID: Alexandria Leblanc, female    DOB: [birth year], 50 y.o.   MRN: [id number]  HPI  This visit occurred during the SARS-CoV-2 public health emergency.  Safety protocols were in place, including screening questions prior to the visit, additional usage of staff PPE, and extensive cleaning of exam room while observing appropriate contact time as indicated for disinfecting solutions.   Alexandria Leblanc is a 50 year old female with a history of hypertension, asthma, GERD, type 2 diabetes, chronic back pain, hyperlipidemia, headaches who presents today for follow-up of chronic conditions and a chief complaint of vaginal discharge.   1) Thrombocytopenia: Previously following with oncology, last visit in August 2021.  Recommendations at that time were to either follow-up every 6 months, or monitor platelet count per PCP.  It was also recommended that she be referred back to hematology for platelets less than 100 and trending down.  2) Essential Hypertension: Currently managed on hydrochlorothiazide 25 mg daily, losartan 100 mg daily. She is checking her BP at home with her mothers wrist cuff which is running 140/60's.  She does have chronic headaches, occasional dizziness.   BP Readings from Last 3 Encounters:  07/30/20 (!) 150/82  07/07/20 (!) 160/76  01/03/20 (!) 148/78   3) Frequent Headaches: Strong family history of migraines and headaches. Headaches occur almost everyday which are typically located to the bilateral frontal lobes. She's never been treated for headaches.    4) Asthma: Currently managed on albuterol inhaler for which she uses three times weekly on average. Breo inhaler for which she is using once daily as prescribed.   5) Type 2 Diabetes:  Current medications include: Glipizide XL 5 mg daily, Metformin 1000 mg twice daily.  She is checking her blood glucose infrequently and is getting readings of 250's, 500's, low 100's. She is drinking Diet [employer]  mostly.  She endorses a poor diet with pastas, rice, corn, sugary foods.   Last A1C: 7.31 July 2019 Last Eye Exam: Due Last Foot Exam: Due Pneumonia Vaccination: 2017 ACE/ARB: Losartan Statin: Rosuvastatin  6) Vaginal Discharge: Also with vaginal swelling and "raw" sensation, this began about one month ago. She's tried treating with OTC vaginal yeast cream without improvement. She had unprotected intercourse about 2 months ago which was very painful. Her discharge is yellow. She has noticed a yellow color to her urine, little water intake.   Review of Systems  Eyes: Negative for visual disturbance.  Respiratory: Negative for shortness of breath.   Cardiovascular: Negative for chest pain.  Genitourinary: Positive for vaginal discharge and vaginal pain.  Musculoskeletal: Positive for arthralgias and back pain.  Neurological: Positive for dizziness and headaches.       Past Medical History:  Diagnosis Date  . Asthma   . Genital warts   . GERD (gastroesophageal reflux disease)   . Hypertension   . Migraines   . Type 2 diabetes mellitus ([place])      Social History   Socioeconomic History  . Marital status: Single    Spouse name: Not on file  . Number of children: Not on file  . Years of education: Not on file  . Highest education level: Not on file  Occupational History  . Not on file  Tobacco Use  . Smoking status: Current Every Day Smoker    Packs/day: 1.00    Types: Cigarettes  . Smokeless tobacco: Never Used  Substance and Sexual Activity  . Alcohol use: Yes    Alcohol/week:  0.0 standard drinks    Comment: occ  . Drug use: No  . Sexual activity: Not on file  Other Topics Concern  . 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 1and half a day- smoking; 5 beers a week; no hard liqor.           Social Determinants of Health   Financial Resource Strain:  Not on file  Food Insecurity: Not on file  Transportation Needs: Not on file  Physical Activity: Not on file  Stress: Not on file  Social Connections: Not on file  Intimate Partner Violence: Not on file    No past surgical history on file.  Family History  Problem Relation Age of Onset  . Heart disease Mother   . Hypertension Mother   . Dementia Mother   . COPD Father   . Hypertension Father   . Hyperlipidemia Brother   . Prostate cancer Paternal Uncle   . Alzheimer's disease Sister     Allergies  Allergen Reactions  . Penicillins Hives and Itching  . Sulfa Antibiotics     Current Outpatient Medications on File Prior to Visit  Medication Sig Dispense Refill  . albuterol (VENTOLIN HFA) 108 (90 Base) MCG/ACT inhaler INHALE 2 PUFFS INTO THE LUNGS EVERY 6 HOURS AS NEEDED FOR WHEEZING ORSHORTNESS OF BREATH 54 g 0  . blood glucose meter kit and supplies Dispense based on patient and insurance preference. Use up to 2 times daily as directed. (FOR ICD-10 E10.9, E11.9). 1 each 0  . hydrochlorothiazide (HYDRODIURIL) 25 MG tablet TAKE 1 TABLET BY MOUTH ONCE A DAY AS NEEDED FOR BLOOD PRESSURE 90 tablet 1  . omeprazole (PRILOSEC) 20 MG capsule TAKE 1 CAPSULE BY MOUTH ONCE DAILY FOR HEARTBURN. 90 capsule 1  . rosuvastatin (CRESTOR) 5 MG tablet TAKE 1 TABLET BY MOUTH ONCE A DAY FOR CHOLESTEROL. 90 tablet 3   No current facility-administered medications on file prior to visit.    BP (!) 150/82   Pulse 80   Temp (!) 97.4 F (36.3 C) (Temporal)   Ht 5' 3"  (1.6 m)   Wt 185 lb (83.9 kg)   LMP  (LMP Unknown)   SpO2 97%   BMI 32.77 kg/m    Objective:   Physical Exam Constitutional:      Appearance: She is well-nourished.  Cardiovascular:     Rate and Rhythm: Normal rate and regular rhythm.  Pulmonary:     Effort: Pulmonary effort is normal.     Breath sounds: Normal breath sounds.  Genitourinary:    Labia:        Right: No rash or tenderness.        Left: No rash or  tenderness.      Vagina: Erythema and tenderness present.     Cervix: Discharge present. No cervical motion tenderness.     Comments: Vaginal atrophy and dryness noted to labia majora bilaterally.  Scant amount of whitish-yellow discharge at cervical os. Musculoskeletal:     Cervical back: Neck supple.  Skin:    General: Skin is warm and dry.  Psychiatric:        Mood and Affect: Mood and affect normal.            Assessment & Plan:

## 2020-07-30 NOTE — Assessment & Plan Note (Signed)
Chronic for the last month, also with vaginal pain.  Wet prep, gonorrhea and chlamydia swabs pending.  UA pending today.

## 2020-07-30 NOTE — Assessment & Plan Note (Signed)
Previously following with oncology, will repeat CBC and monitor.  If platelets drop to the 100,000 range or below, then will refer back.

## 2020-07-31 LAB — WET PREP BY MOLECULAR PROBE
Candida species: NOT DETECTED
Gardnerella vaginalis: NOT DETECTED
MICRO NUMBER:: 11598109
SPECIMEN QUALITY:: ADEQUATE
Trichomonas vaginosis: NOT DETECTED

## 2020-07-31 LAB — C. TRACHOMATIS/N. GONORRHOEAE RNA
C. trachomatis RNA, TMA: NOT DETECTED
N. gonorrhoeae RNA, TMA: NOT DETECTED

## 2020-08-01 NOTE — Telephone Encounter (Signed)
That is fine.  Can you help get her a note that says  The patient needs to receive physical therapy for 6 weeks.

## 2020-08-05 ENCOUNTER — Other Ambulatory Visit: Payer: Self-pay | Admitting: Primary Care

## 2020-08-05 DIAGNOSIS — J4540 Moderate persistent asthma, uncomplicated: Secondary | ICD-10-CM

## 2020-08-11 ENCOUNTER — Other Ambulatory Visit: Payer: Self-pay

## 2020-08-11 ENCOUNTER — Telehealth: Payer: Self-pay | Admitting: *Deleted

## 2020-08-11 DIAGNOSIS — E119 Type 2 diabetes mellitus without complications: Secondary | ICD-10-CM

## 2020-08-11 MED ORDER — GLUCOSE BLOOD VI STRP: ORAL_STRIP | 3 refills | Status: DC

## 2020-08-11 MED ORDER — BLOOD GLUCOSE METER KIT: PACK | 0 refills | Status: DC

## 2020-08-11 MED ORDER — FREESTYLE LITE W/DEVICE KIT: 1.0000 | PACK | 0 refills | Status: AC

## 2020-08-11 NOTE — Telephone Encounter (Signed)
New script sent as directed.

## 2020-08-11 NOTE — Telephone Encounter (Signed)
Landon from [facility] left a voicemail stating that they received a script for a glucose machine. Harmon Pier stated that her insurance will cover a Freestyle lite meter. Harmon Pier stated that they need a script for #100 lancets and #100 strips to go with the meter which will give her a 50 day supply with her testing twice a day.

## 2020-08-19 ENCOUNTER — Other Ambulatory Visit: Payer: Self-pay | Admitting: Primary Care

## 2020-08-19 DIAGNOSIS — I10 Essential (primary) hypertension: Secondary | ICD-10-CM

## 2020-08-27 NOTE — Telephone Encounter (Signed)
Left message to return call to our office.  Need clarification not sure what patient is needed.

## 2020-08-27 NOTE — Telephone Encounter (Signed)
Pt called in and wanted to get a call back around [phone]

## 2020-09-03 ENCOUNTER — Other Ambulatory Visit: Payer: Self-pay

## 2020-09-03 DIAGNOSIS — E119 Type 2 diabetes mellitus without complications: Secondary | ICD-10-CM

## 2020-09-03 MED ORDER — TRULICITY 0.75 MG/0.5ML ~~LOC~~ SOAJ: 0.7500 mg | SUBCUTANEOUS | 0 refills | Status: DC

## 2020-09-16 ENCOUNTER — Other Ambulatory Visit: Payer: Self-pay | Admitting: Primary Care

## 2020-09-16 DIAGNOSIS — E785 Hyperlipidemia, unspecified: Secondary | ICD-10-CM

## 2020-09-17 DIAGNOSIS — E119 Type 2 diabetes mellitus without complications: Secondary | ICD-10-CM

## 2020-09-18 MED ORDER — TRULICITY 1.5 MG/0.5ML ~~LOC~~ SOAJ: 1.5000 mg | SUBCUTANEOUS | 0 refills | Status: DC

## 2020-09-29 ENCOUNTER — Other Ambulatory Visit: Payer: Self-pay | Admitting: Primary Care

## 2020-09-29 DIAGNOSIS — K219 Gastro-esophageal reflux disease without esophagitis: Secondary | ICD-10-CM

## 2020-10-02 ENCOUNTER — Ambulatory Visit: Payer: Managed Care, Other (non HMO) | Admitting: Family Medicine

## 2020-10-02 ENCOUNTER — Encounter: Payer: Self-pay | Admitting: Family Medicine

## 2020-10-02 ENCOUNTER — Other Ambulatory Visit: Payer: Self-pay

## 2020-10-02 VITALS — BP 132/72 | HR 75 | Temp 98.3°F | Ht 63.0 in | Wt 188.5 lb

## 2020-10-02 DIAGNOSIS — M79662 Pain in left lower leg: Secondary | ICD-10-CM

## 2020-10-02 DIAGNOSIS — M5416 Radiculopathy, lumbar region: Secondary | ICD-10-CM | POA: Diagnosis not present

## 2020-10-02 DIAGNOSIS — M79661 Pain in right lower leg: Secondary | ICD-10-CM

## 2020-10-02 DIAGNOSIS — M549 Dorsalgia, unspecified: Secondary | ICD-10-CM | POA: Diagnosis not present

## 2020-10-02 DIAGNOSIS — G8929 Other chronic pain: Secondary | ICD-10-CM

## 2020-10-02 NOTE — Patient Instructions (Signed)
Ask your HR department about intermittent FMLA paperwork to send to [place].

## 2020-10-02 NOTE — Progress Notes (Signed)
Artis Beggs T. Dezmond Downie, MD, CAQ Sports Medicine  Primary Care and Sports Medicine [facility] [address] [place], [address]  Phone: [phone]  FAX: [phone]  Alexandria Leblanc - 50 y.o. female  MRN [id number]  Date of Birth: 06-13-1970  Date: 10/02/2020  PCP: Doreene Nest, NP  Referral: Doreene Nest, NP  Chief Complaint  Patient presents with  . Back Pain    Lower back that radiates down legs.  It is effecting her work.  . Foot Pain    Left    This visit occurred during the SARS-CoV-2 public health emergency.  Safety protocols were in place, including screening questions prior to the visit, additional usage of staff PPE, and extensive cleaning of exam room while observing appropriate contact time as indicated for disinfecting solutions.   Subjective:   Alexandria Leblanc is a 50 y.o. very pleasant female patient with Body mass index is 33.39 kg/m. who presents with the following:  Chronic back pain: The patient has been seen a number of times by myself, and she has been having some worsening now bilateral lumbar radiculopathy throughout the entirety of her legs.  She has had a number of rounds of oral steroids, and she also has done formal PT as well as a home rehab program.  On her prior lumbar spine films, reviewed these today, if she does have some mild degenerative changes only.  Her symptoms have waxed and waned, but more recently these have gotten much worse.  I last saw her in February, and she continues to do poorly.  This is causing an impediment to her activities of basic living.  Ongoing back pain, lumbar that is really impairing her.  When she gets up in the morning, trouble walking and standing.   07/07/2020 Last OV with Hannah Beat, MD  She is a 50 year old with a history of LBP, SI joint pain.  I saw her a couple of times in early 2021.  She has had 2 rounds of steroids and did a HEP.  She returns  today.  She did have some mild DDD only on her 2021 lumbar spine series.   This is more intense in the morning.  Could barely walk in the morning going to the bathroom.   Has not really gotten any better.  Yesterday did have some arm pain. Went away.   At this point she has done some occ HEP without success.  Review of Systems is noted in the HPI, as appropriate   Objective:   BP 132/72   Pulse 75   Temp 98.3 F (36.8 C) (Temporal)   Ht 5\' 3"  (1.6 m)   Wt 188 lb 8 oz (85.5 kg)   LMP  (LMP Unknown)   SpO2 97%   BMI 33.39 kg/m    Range of motion at  the waist: Flexion, extension, lateral bending and rotation: Forward flexion is limited to 50 degrees.  Extension is approaching normal, but it does produce pain.  Lateral bending and rotation are more normal, but they do prefer pain at the endpoint.  No echymosis or edema Rises to examination table with mild difficulty Gait: minimally antalgic  Inspection/Deformity: N Paraspinus Tenderness: Diffuse from L1-S1 bilaterally  B Ankle Dorsiflexion (L5,4): 5/5 B Great Toe Dorsiflexion (L5,4): 5/5 Heel Walk (L5): WNL Toe Walk (S1): WNL Rise/Squat (L4): WNL, mild pain  SENSORY B Medial Foot (L4): WNL B Dorsum (L5): WNL B Lateral (S1):  WNL Light Touch: WNL Pinprick: WNL  B SLR, seated: Pain B SLR, supine: Pain B FABER: Pain B Reverse FABER: n pain  B Greater Troch: NT B Log Roll: neg B Sciatic Notch: Painful  Radiology: DG Cervical Spine 2 or 3 views  Result Date: 08/07/2019 CLINICAL DATA:  Acute onset neck pain and upper extremity numbness. No known injury. EXAM: CERVICAL SPINE - 2-3 VIEW COMPARISON:  None. FINDINGS: No fracture or malalignment. Straightening of lordosis noted. There is loss of disc space height at C5-6 and C6-7 with associated endplate spurring. Prevertebral soft tissues appear normal. IMPRESSION: C5-6 and C6-7 degenerative disc disease. Electronically Signed   By: Inge Rise M.D.   On: 08/07/2019  15:29   DG Lumbar Spine 2-3 Views  Result Date: 08/07/2019 CLINICAL DATA:  Acute on chronic low back pain. EXAM: LUMBAR SPINE - 2-3 VIEW COMPARISON:  None. FINDINGS: Vertebral body height and alignment are maintained. Mild loss of disc space height is seen at L1-2 and L2-3. There is some facet arthropathy at L5-S1. Paraspinous structures demonstrate multiple gallstones and atherosclerosis. IMPRESSION: No acute finding. Mild degenerative disease. Gallstones. Atherosclerosis. Electronically Signed   By: Inge Rise M.D.   On: 08/07/2019 15:28   Assessment and Plan:     ICD-10-CM   1. Lumbar radiculopathy, chronic  M54.16 MR Lumbar Spine Wo Contrast  2. Pain in both lower legs  M79.661 MR Lumbar Spine Wo Contrast   M79.662   3. Chronic back pain greater than 3 months duration  M54.9 MR Lumbar Spine Wo Contrast   G89.29    Acute on chronic back pain with radiculopathy with exacerbation and worsening over time.  She has failed many months of conservative care including multiple rounds of steroids, oral anti-inflammatories, formal physical therapy, home rehab, modification of daily activities, and she is still impaired daily.  Obtain an MRI of the lumbar spine without contrast to evaluate for cord edema, spinal stenosis, foraminal stenosis, or other occult soft tissue pathology to explain her ongoing chronic symptoms.  Social: Pain is limiting her activities of daily living as well as her ability to even get basic exercise.  Orders Placed This Encounter  Procedures  . MR Lumbar Spine Wo Contrast    Follow-up: This will depend on MRI findings  Signed,  Jeziel Hoffmann T. Arick Mareno, MD   Outpatient Encounter Medications as of 10/02/2020  Medication Sig  . acyclovir (ZOVIRAX) 400 MG tablet Take 1 tablet (400 mg total) by mouth 2 (two) times daily. For herpes prevention.  Marland Kitchen albuterol (VENTOLIN HFA) 108 (90 Base) MCG/ACT inhaler INHALE 2 PUFFS INTO LUNGS EVERY 6 HOURS AS NEEDED FOR WHEEZING OR  SHORTNESS OF BREATH  . Blood Glucose Monitoring Suppl (FREESTYLE LITE) w/Device KIT 1 each by Does not apply route as directed. DX E11.9  . Dulaglutide (TRULICITY) 1.5 ZO/1.0RU SOPN Inject 1.5 mg into the skin once a week. For diabetes.  . fluticasone (FLONASE) 50 MCG/ACT nasal spray Place 1 spray into both nostrils 2 (two) times daily.  . fluticasone furoate-vilanterol (BREO ELLIPTA) 100-25 MCG/INH AEPB Inhale 1 puff into the lungs daily.  Marland Kitchen glipiZIDE (GLIPIZIDE XL) 10 MG 24 hr tablet Take 1 tablet (10 mg total) by mouth daily with breakfast. For diabetes.  Marland Kitchen glucose blood test strip Use to test blood sugars up to three times a day Dx E11.9  . hydrochlorothiazide (HYDRODIURIL) 25 MG tablet Take 1 tablet (25 mg total) by mouth daily. For blood pressure.  . Lancets (FREESTYLE) lancets 1 each  3 (three) times daily.  . metFORMIN (GLUCOPHAGE) 1000 MG tablet TAKE 1 TABLET BY MOUTH TWICE DAILY WITH MEALS FOR DIABETES  . olmesartan (BENICAR) 20 MG tablet Take 1 tablet (20 mg total) by mouth daily. For blood pressure.  Marland Kitchen omeprazole (PRILOSEC) 20 MG capsule TAKE 1 CAPSULE BY MOUTH ONCE DAILY FOR HEARTBURN.  . rosuvastatin (CRESTOR) 5 MG tablet TAKE 1 TABLET BY MOUTH ONCE A DAY FOR CHOLESTEROL.   No facility-administered encounter medications on file as of 10/02/2020.

## 2020-10-17 ENCOUNTER — Ambulatory Visit
Admission: RE | Admit: 2020-10-17 | Discharge: 2020-10-17 | Disposition: A | Discharge status: Home / Self Care | Payer: Managed Care, Other (non HMO) | Source: Ambulatory Visit | Attending: Family Medicine | Admitting: Family Medicine

## 2020-10-17 ENCOUNTER — Other Ambulatory Visit: Payer: Self-pay

## 2020-10-17 DIAGNOSIS — M79661 Pain in right lower leg: Secondary | ICD-10-CM

## 2020-10-17 DIAGNOSIS — M79662 Pain in left lower leg: Secondary | ICD-10-CM

## 2020-10-17 DIAGNOSIS — M5416 Radiculopathy, lumbar region: Secondary | ICD-10-CM

## 2020-10-17 DIAGNOSIS — G8929 Other chronic pain: Secondary | ICD-10-CM

## 2020-10-17 IMAGING — MR MR LUMBAR SPINE W/O CM
4 of 5 series · 26 of 48 positions shown · non-contrast
Comparison: Lumbar radiographs [DATE].

CLINICAL DATA: 50-year-old female with low back pain and bilateral
leg pain for 2 years.

EXAM:
MRI LUMBAR SPINE WITHOUT CONTRAST
TECHNIQUE: Multiplanar, multisequence MR imaging of the lumbar spine was
performed. No intravenous contrast was administered.

[Series 3: T2 · sagittal · 4.0mm · 1.09mm/px · 6 of 17 slices shown (1 of 2)]
[im 1/17]
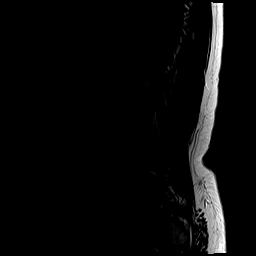
[im 4/17]
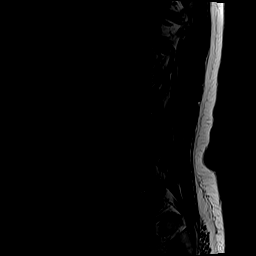
[im 7/17]
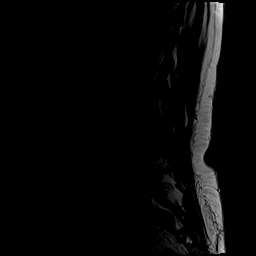
[im 10/17]
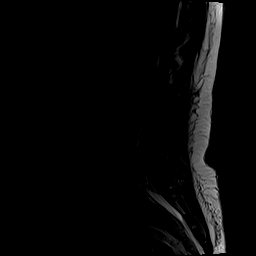
[im 13/17]
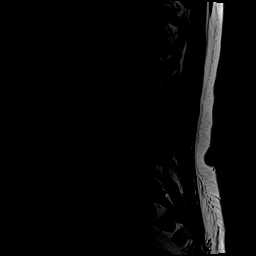
[im 17/17]
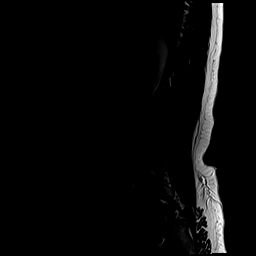

[Series 5: T1 · sagittal · 4.0mm · 1.09mm/px · 6 of 17 slices shown (1 of 2)]
[im 1/17]
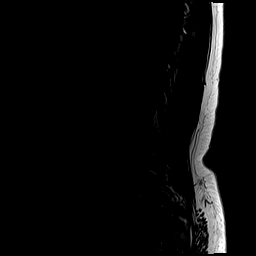
[im 4/17]
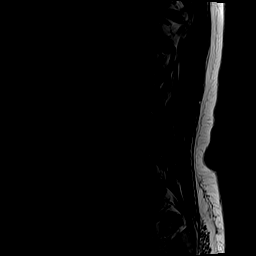
[im 7/17]
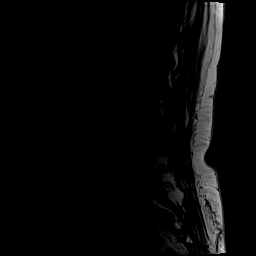
[im 10/17]
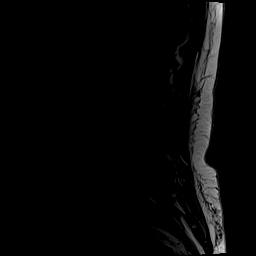
[im 13/17]
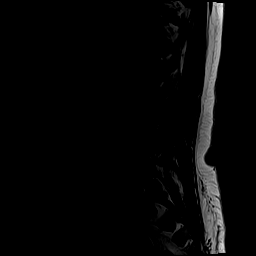
[im 17/17]
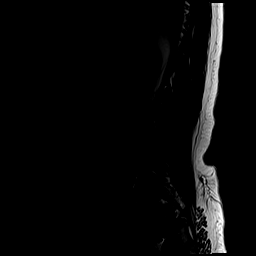

[Series 6: T2 · axial · 4.0mm · 0.39mm/px · z∈[-31,+186]mm · 9 of 41 slices shown (2 of 2)]
[im 1/41]
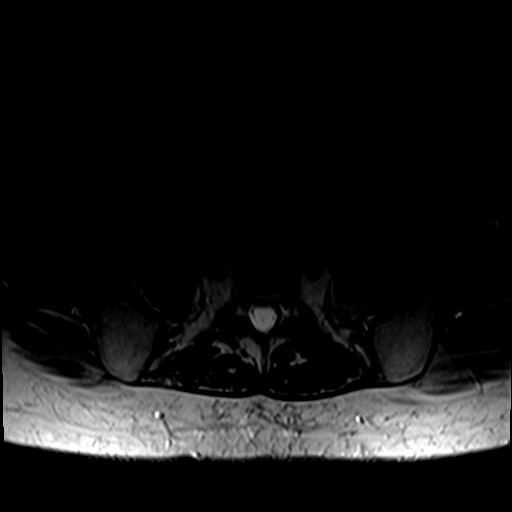
[im 6/41]
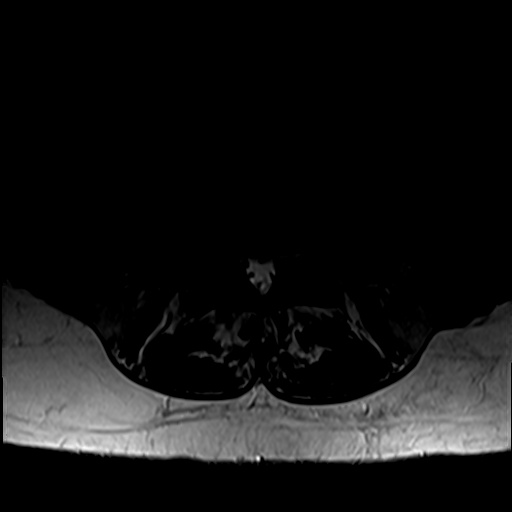
[im 12/41]
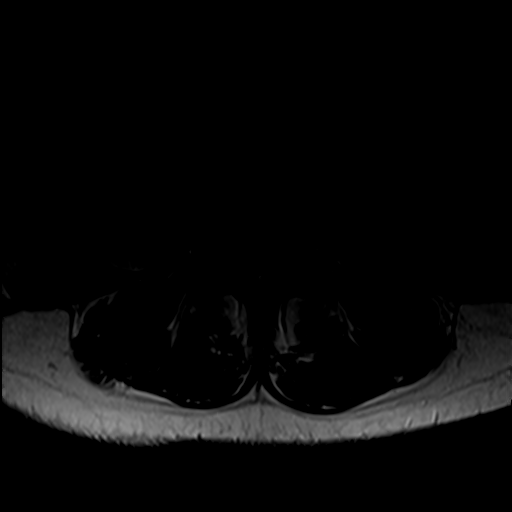
[im 18/41]
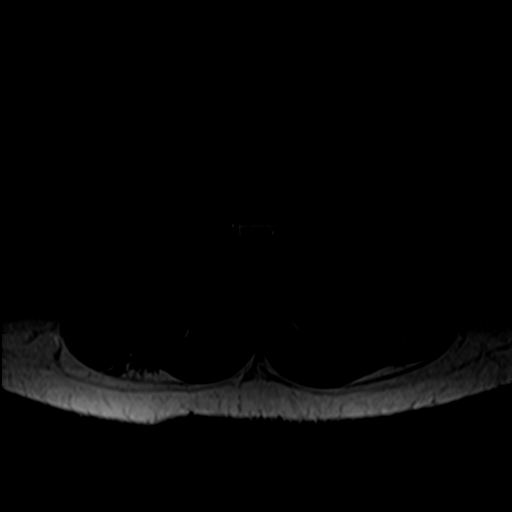
[im 21/41]
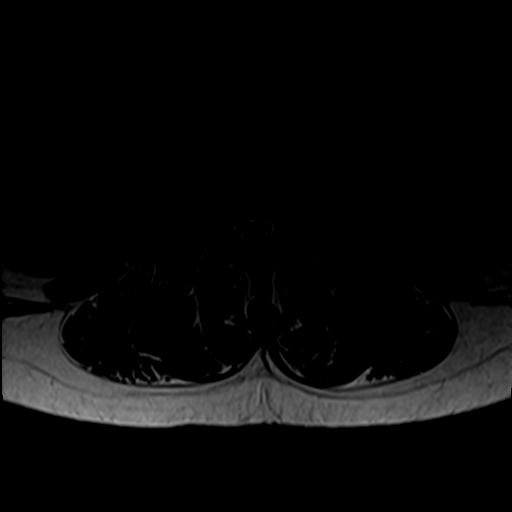
[im 23/41]
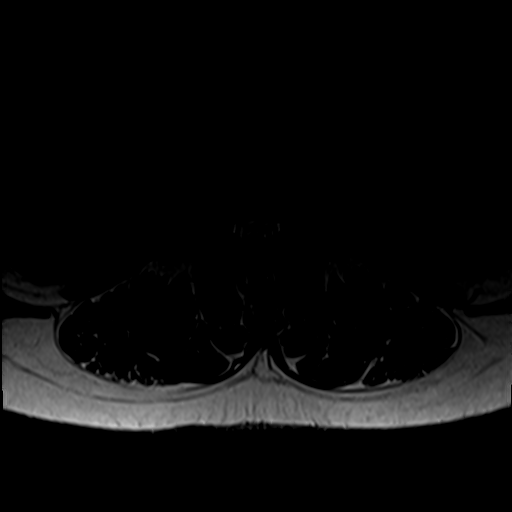
[im 29/41]
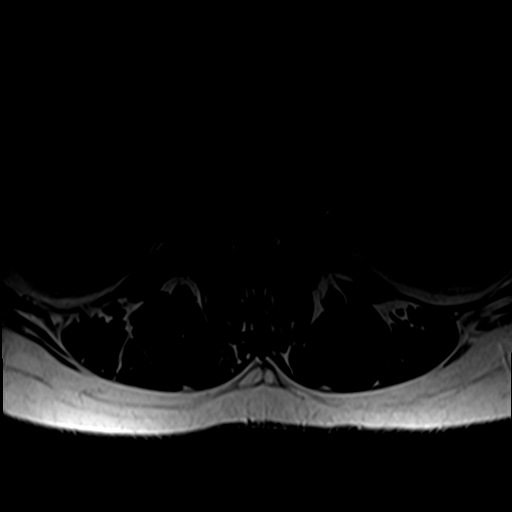
[im 35/41]
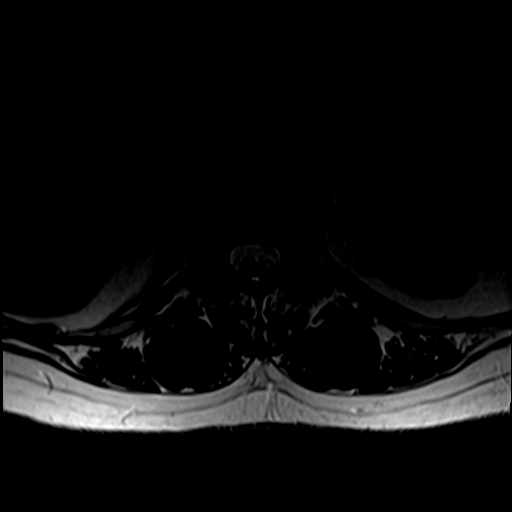
[im 41/41]
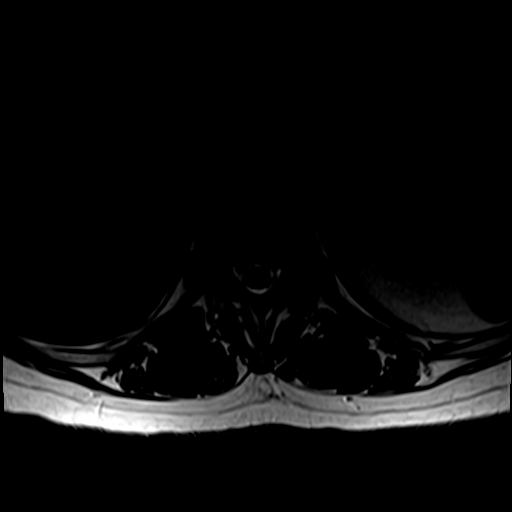

[Series 7: T1 · axial · 4.0mm · 0.39mm/px · z∈[-31,+157]mm · 5 of 41 slices shown (2 of 2)]
[im 1/41]
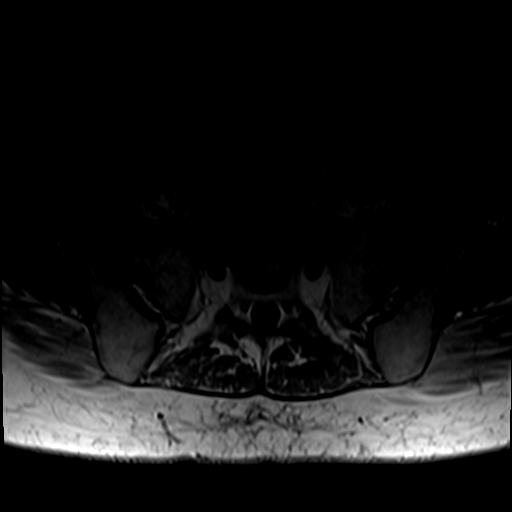
[im 6/41]
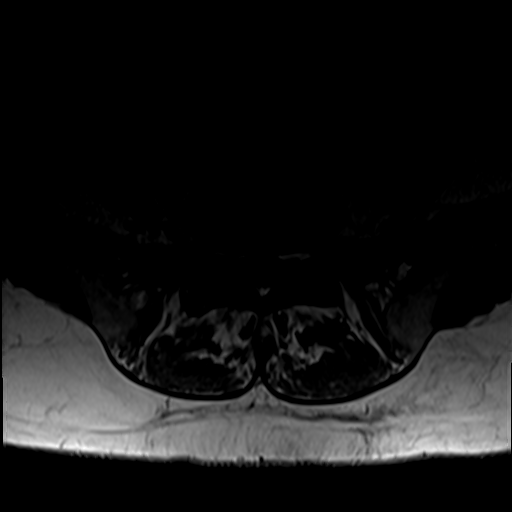
[im 12/41]
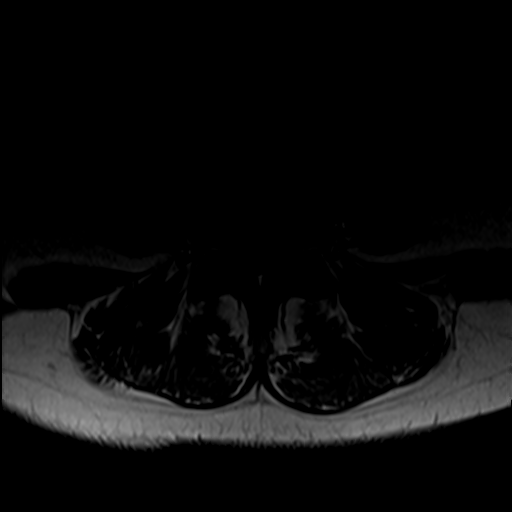
[im 21/41]
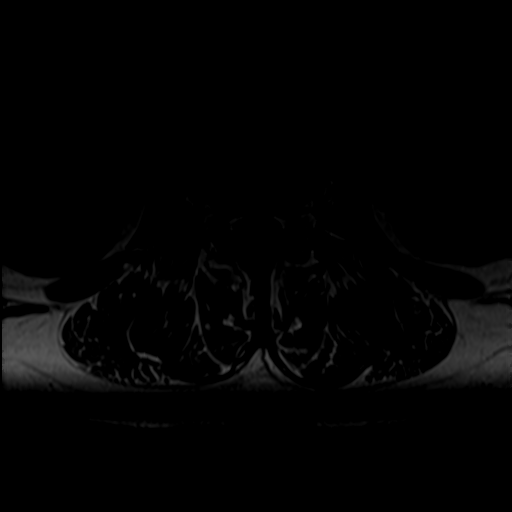
[im 35/41]
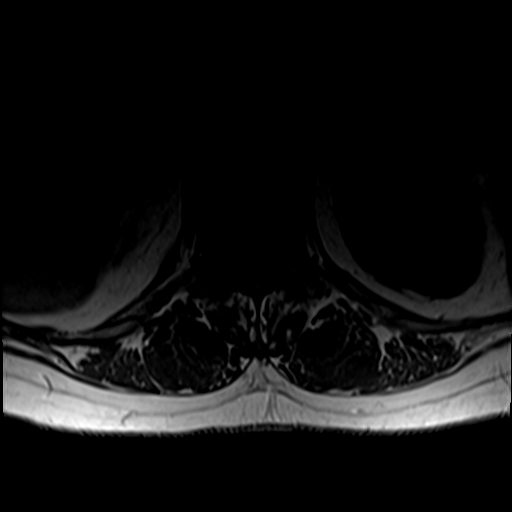

[26 of 48 positions shown; findings below may reference images not displayed]

FINDINGS: Segmentation:  Normal on the comparison.

Alignment: Preserved lumbar lordosis. Subtle retrolisthesis of L1 on
L2.

Vertebrae: Combined acute and chronic degenerative endplate marrow
signal changes at L1-L2 with patchy marrow edema (series 4, image
10). No other No marrow edema or evidence of acute osseous
abnormality. Normal background bone marrow signal. Intact visible
sacrum and SI joints.

Conus medullaris and cauda equina: Conus extends to the L1 level,
see L1-L2 findings below. No lower spinal cord or conus signal
abnormality.

Paraspinal and other soft tissues: Negative visible abdominal
viscera. Paraspinal soft tissues remarkable for an oval slightly
lobulated T1 hypointense T2 and STIR hyperintense soft tissue mass
just below the right 12th intercostal space lateral to the right
12th neural foramen (series 7, image 4, series 6, image 4 and series
4, image 3. This encompasses 22 x 8 by 14 mm (AP by transverse by
CC) the lesion is solitary. Other paraspinal soft tissues appear
normal.

Disc levels:

Lower thoracic disc desiccation and disc space loss through T11-T12
where a small left paracentral disc protrusion is partially visible
on series 3, image 10. No definite lower thoracic spinal stenosis.

T12-L1:  Negative.

L1-L2: Severe disc space loss. Bulky circumferential disc osteophyte
complex. Mild to moderate spinal stenosis at the tip of the conus
(series 3, image 9). Mild bilateral lateral recess stenosis (L2
nerve levels). Mild to moderate L1 foraminal stenosis greater on the
left.

L2-L3: Mild mostly far lateral disc bulging. Mild endplate spurring.
Borderline to mild L2 neural foraminal stenosis.

L3-L4: Mild disc bulging and facet hypertrophy. Mild bilateral L3
foraminal stenosis.

L4-L5: Disc desiccation with moderate circumferential disc bulge.
Moderate facet and ligament flavum hypertrophy. Moderate spinal and
bilateral lateral recess stenosis (series 6, image 30), including
involvement of the L5 nerve levels. Mild to moderate bilateral L4
neural foraminal stenosis.

L5-S1: Mild disc bulging. Small posterior annular fissure of the
disc. Severe facet hypertrophy. No spinal or convincing lateral
recess stenosis. Moderate L5 foraminal stenosis greater on the
right.
IMPRESSION: 1. Oval 22 mm soft tissue mass in the right 12th inter costal space
lateral to the right T12 neural foramen. Suspect this is an
incidental benign peripheral nerve sheath tumor. Repeat MRI in 3-6
months can be obtained to document stability.
2. Advanced disc degeneration at L1-L2 with acute on chronic
endplate changes including patchy marrow edema. Mild to moderate
spinal stenosis there at the tip of the conus medullaris, but no
conus signal abnormality. Mild to moderate L1 foraminal stenosis
greater on the left.
3. Moderate multifactorial spinal and lateral recess stenosis at
L4-L5 with up to moderate bilateral L4 neural foraminal stenosis.
4. Severe facet degeneration at L5-S1 contributing to moderate L5
neural foraminal stenosis greater on the right.

## 2020-10-19 ENCOUNTER — Other Ambulatory Visit: Payer: Managed Care, Other (non HMO)

## 2020-10-21 ENCOUNTER — Other Ambulatory Visit: Payer: Self-pay | Admitting: Family Medicine

## 2020-10-21 DIAGNOSIS — M5416 Radiculopathy, lumbar region: Secondary | ICD-10-CM

## 2020-10-21 DIAGNOSIS — M48061 Spinal stenosis, lumbar region without neurogenic claudication: Secondary | ICD-10-CM

## 2020-10-21 DIAGNOSIS — M79661 Pain in right lower leg: Secondary | ICD-10-CM

## 2020-10-21 NOTE — Progress Notes (Signed)
ICD-10-CM   1. Lumbar radiculopathy, chronic  M54.16 Ambulatory referral to Neurosurgery  2. Pain in both lower legs  M79.661 Ambulatory referral to Neurosurgery   M79.662   3. Neural foraminal stenosis of lumbar spine  M48.061 Ambulatory referral to Neurosurgery     Orders Placed This Encounter  Procedures  . Ambulatory referral to Neurosurgery

## 2020-10-29 ENCOUNTER — Other Ambulatory Visit: Payer: Self-pay | Admitting: Primary Care

## 2020-10-29 DIAGNOSIS — I10 Essential (primary) hypertension: Secondary | ICD-10-CM

## 2020-10-31 ENCOUNTER — Ambulatory Visit: Payer: Managed Care, Other (non HMO) | Admitting: Primary Care

## 2020-11-11 ENCOUNTER — Other Ambulatory Visit: Payer: Self-pay | Admitting: Primary Care

## 2020-11-11 ENCOUNTER — Encounter: Payer: Self-pay | Admitting: Primary Care

## 2020-11-11 ENCOUNTER — Ambulatory Visit: Payer: Managed Care, Other (non HMO) | Admitting: Primary Care

## 2020-11-11 ENCOUNTER — Other Ambulatory Visit: Payer: Self-pay

## 2020-11-11 VITALS — BP 134/72 | HR 82 | Temp 97.6°F | Ht 63.0 in | Wt 186.0 lb

## 2020-11-11 DIAGNOSIS — E785 Hyperlipidemia, unspecified: Secondary | ICD-10-CM | POA: Diagnosis not present

## 2020-11-11 DIAGNOSIS — E119 Type 2 diabetes mellitus without complications: Secondary | ICD-10-CM | POA: Diagnosis not present

## 2020-11-11 DIAGNOSIS — M5441 Lumbago with sciatica, right side: Secondary | ICD-10-CM

## 2020-11-11 DIAGNOSIS — M549 Dorsalgia, unspecified: Secondary | ICD-10-CM

## 2020-11-11 DIAGNOSIS — D696 Thrombocytopenia, unspecified: Secondary | ICD-10-CM

## 2020-11-11 DIAGNOSIS — I10 Essential (primary) hypertension: Secondary | ICD-10-CM

## 2020-11-11 DIAGNOSIS — M5442 Lumbago with sciatica, left side: Secondary | ICD-10-CM

## 2020-11-11 DIAGNOSIS — G8929 Other chronic pain: Secondary | ICD-10-CM

## 2020-11-11 LAB — CBC
HCT: 41.1 % (ref 36.0–46.0)
Hemoglobin: 13.8 g/dL (ref 12.0–15.0)
MCHC: 33.6 g/dL (ref 30.0–36.0)
MCV: 91.7 fl (ref 78.0–100.0)
Platelets: 112 10*3/uL — ABNORMAL LOW (ref 150.0–400.0)
RBC: 4.48 Mil/uL (ref 3.87–5.11)
RDW: 13.7 % (ref 11.5–15.5)
WBC: 6 10*3/uL (ref 4.0–10.5)

## 2020-11-11 LAB — COMPREHENSIVE METABOLIC PANEL
ALT: 24 U/L (ref 0–35)
AST: 25 U/L (ref 0–37)
Albumin: 4.1 g/dL (ref 3.5–5.2)
Alkaline Phosphatase: 111 U/L (ref 39–117)
BUN: 8 mg/dL (ref 6–23)
CO2: 25 mEq/L (ref 19–32)
Calcium: 9.3 mg/dL (ref 8.4–10.5)
Chloride: 100 mEq/L (ref 96–112)
Creatinine, Ser: 0.56 mg/dL (ref 0.40–1.20)
GFR: 106.5 mL/min (ref >60.00)
Glucose, Bld: 145 mg/dL — ABNORMAL HIGH (ref 70–99)
Potassium: 4.2 mEq/L (ref 3.5–5.1)
Sodium: 135 mEq/L (ref 135–145)
Total Bilirubin: 0.3 mg/dL (ref 0.2–1.2)
Total Protein: 7.8 g/dL (ref 6.0–8.3)

## 2020-11-11 LAB — LDL CHOLESTEROL, DIRECT: Direct LDL: 74 mg/dL

## 2020-11-11 LAB — POCT GLYCOSYLATED HEMOGLOBIN (HGB A1C): Hemoglobin A1C: 6.7 % — AB (ref 4.0–5.6)

## 2020-11-11 LAB — LIPID PANEL
Cholesterol: 153 mg/dL (ref 0–200)
HDL: 34.9 mg/dL — ABNORMAL LOW (ref >39.00)
NonHDL: 117.6
Total CHOL/HDL Ratio: 4
Triglycerides: 250 mg/dL — ABNORMAL HIGH (ref 0.0–149.0)
VLDL: 50 mg/dL — ABNORMAL HIGH (ref 0.0–40.0)

## 2020-11-11 NOTE — Assessment & Plan Note (Signed)
Evaluated by hematology, was told to stop drinking and smoking. She continues to smoke, has reduced alcohol intake.  Repeat CBC pending.

## 2020-11-11 NOTE — Assessment & Plan Note (Signed)
Borderline high, also sodium has been low which is likely due to HCTZ.  Start with repeat CMP today, if sodium low then stop HCTZ, increase olmesartan to 40 mg if potassium looks good.

## 2020-11-11 NOTE — Patient Instructions (Signed)
Stop by the lab prior to leaving today. I will notify you of your results once received.   You will be contacted regarding your referral to pain management.   Please let us know if you have not been contacted within two weeks.   Please schedule a follow up visit for 6 months for diabetes check.  It was a pleasure to see you today!

## 2020-11-11 NOTE — Progress Notes (Signed)
Subjective:    Patient ID: Alexandria Leblanc, female    DOB: [birth year], 50 y.o.   MRN: [id number]  HPI  Alexandria Leblanc is a very pleasant 50 y.o. female with a history of hypertension, asthma, type 2 diabetes, chronic back pain, frequent headaches, thrombocytopenia who presents today for follow up of diabetes, repeat lipid panel. She would also like to discuss nausea and chronic back pain.   Current medications include: Glipizide XL 10 mg daily, Trulicity 1.5 mg weekly, metformin 1000 mg BID.   She is checking her blood glucose infrequently.   Last A1C: 11.2 in March 2022, 6.7 today Last Eye Exam: Due Last Foot Exam: Due Pneumonia Vaccination: UTD, 2017 Urine Microalbumin: None. Olmesartan  Statin: Crestor   Dietary changes since last visit: She has been watching her diet, limiting portion sizes and reducing junk food. She is not eating many sweets.    Exercise: She is not exercising.   BP Readings from Last 3 Encounters:  11/11/20 134/72  10/02/20 132/72  07/30/20 (!) 150/82   She will experience nausea everyday, occurs when she due to eat. Improved with eating. She does not eat when taking her Glipizide or metformin, does notice she is not nauseated.   She continues to notice ongoing bilateral lower back pain. She attended one physical therapy session, didn't feel comfortable during her session. She also notices lower extremity pain, has spasms. She is on her feet during the day for work. She is considering permanent disability. She has missed a lot of work due to her chronic back pain. She was told by neurosurgery that she cannot have cortisone injections until her platelets increase.     Review of Systems  Respiratory:  Negative for shortness of breath.   Cardiovascular:  Negative for chest pain.  Musculoskeletal:  Positive for arthralgias and back pain.  Neurological:  Negative for dizziness.        Past Medical History:  Diagnosis Date   Asthma    Genital  warts    GERD (gastroesophageal reflux disease)    Hypertension    Migraines    Type 2 diabetes mellitus ([place])     Social History   Socioeconomic History   Marital status: Single    Spouse name: Not on file   Number of children: Not on file   Years of education: Not on file   Highest education level: Not on file  Occupational History   Not on file  Tobacco Use   Smoking status: Every Day    Packs/day: 1.00    Pack years: 0.00    Types: Cigarettes   Smokeless tobacco: Never  Substance and Sexual Activity   Alcohol use: Yes    Alcohol/week: 0.0 standard drinks    Comment: occ   Drug use: No   Sexual activity: Not on file  Other Topics Concern   Not on file  Social History Narrative   Single; No children.; 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 1and half a day- smoking; 5 beers a week; no hard liqor.           Social Determinants of Health   Financial Resource Strain: Not on file  Food Insecurity: Not on file  Transportation Needs: Not on file  Physical Activity: Not on file  Stress: Not on file  Social Connections: Not on file  Intimate Partner Violence: Not on file    History reviewed. No pertinent surgical  history.  Family History  Problem Relation Age of Onset   Heart disease Mother    Hypertension Mother    Dementia Mother    COPD Father    Hypertension Father    Hyperlipidemia Brother    Prostate cancer Paternal Uncle    Alzheimer's disease Sister     Allergies  Allergen Reactions   Penicillins Hives and Itching   Sulfa Antibiotics     Current Outpatient Medications on File Prior to Visit  Medication Sig Dispense Refill   acyclovir (ZOVIRAX) 400 MG tablet Take 1 tablet (400 mg total) by mouth 2 (two) times daily. For herpes prevention. 180 tablet 3   albuterol (VENTOLIN HFA) 108 (90 Base) MCG/ACT inhaler INHALE 2 PUFFS INTO LUNGS EVERY 6 HOURS AS NEEDED FOR WHEEZING OR SHORTNESS OF BREATH  25.5 g 0   Blood Glucose Monitoring Suppl (FREESTYLE LITE) w/Device KIT 1 each by Does not apply route as directed. DX E11.9 1 kit 0   Dulaglutide (TRULICITY) 1.5 DX/8.3JA SOPN Inject 1.5 mg into the skin once a week. For diabetes. 6 mL 0   fluticasone (FLONASE) 50 MCG/ACT nasal spray Place 1 spray into both nostrils 2 (two) times daily. 16 g 3   fluticasone furoate-vilanterol (BREO ELLIPTA) 100-25 MCG/INH AEPB Inhale 1 puff into the lungs daily. 90 each 3   glipiZIDE (GLIPIZIDE XL) 10 MG 24 hr tablet Take 1 tablet (10 mg total) by mouth daily with breakfast. For diabetes. 90 tablet 3   glucose blood test strip Use to test blood sugars up to three times a day Dx E11.9 200 each 3   hydrochlorothiazide (HYDRODIURIL) 25 MG tablet Take 1 tablet (25 mg total) by mouth daily. For blood pressure. 90 tablet 0   Lancets (FREESTYLE) lancets 1 each 3 (three) times daily.     metFORMIN (GLUCOPHAGE) 1000 MG tablet TAKE 1 TABLET BY MOUTH TWICE DAILY WITH MEALS FOR DIABETES 180 tablet 3   olmesartan (BENICAR) 20 MG tablet TAKE 1 TABLET BY MOUTH ONCE DAILY FOR BLOOD PRESSURE 90 tablet 0   omeprazole (PRILOSEC) 20 MG capsule TAKE 1 CAPSULE BY MOUTH ONCE DAILY FOR HEARTBURN. 90 capsule 3   rosuvastatin (CRESTOR) 5 MG tablet TAKE 1 TABLET BY MOUTH ONCE A DAY FOR CHOLESTEROL. 90 tablet 3   No current facility-administered medications on file prior to visit.    BP 134/72   Pulse 82   Temp 97.6 F (36.4 C) (Temporal)   Ht _0  (1.6 m)   Wt 186 lb (84.4 kg)   LMP  (LMP Unknown)   SpO2 97%   BMI 32.95 kg/m  Objective:   Physical Exam Cardiovascular:     Rate and Rhythm: Normal rate and regular rhythm.  Pulmonary:     Effort: Pulmonary effort is normal.     Breath sounds: Normal breath sounds.  Musculoskeletal:     Cervical back: Neck supple.  Skin:    General: Skin is warm and dry.          Assessment & Plan:      This visit occurred during the SARS-CoV-2 public health emergency.  Safety  protocols were in place, including screening questions prior to the visit, additional usage of staff PPE, and extensive cleaning of exam room while observing appropriate contact time as indicated for disinfecting solutions.

## 2020-11-11 NOTE — Assessment & Plan Note (Signed)
Ongoing, did not feel comfortable with PT, cannot get cortisone injections given low platelet count.  Referral placed for pain management.  Repeat CBC today.

## 2020-11-11 NOTE — Assessment & Plan Note (Signed)
Significant improvement with A1C of 6.7 today!  Continue Trulicity 1.5 mg weekly, Glipizide XL 10 mg, and metformin 1000 mg BID. Also stressed the importance of eating while taking medications given nausea symptoms.  Follow up in 6 months.

## 2020-12-15 NOTE — Telephone Encounter (Signed)
Please advise 

## 2021-01-19 ENCOUNTER — Other Ambulatory Visit: Payer: Self-pay | Admitting: Primary Care

## 2021-01-19 DIAGNOSIS — I10 Essential (primary) hypertension: Secondary | ICD-10-CM

## 2021-01-30 ENCOUNTER — Other Ambulatory Visit: Payer: Self-pay

## 2021-01-30 ENCOUNTER — Ambulatory Visit: Payer: Managed Care, Other (non HMO) | Admitting: Primary Care

## 2021-01-30 ENCOUNTER — Encounter: Payer: Self-pay | Admitting: Primary Care

## 2021-01-30 VITALS — BP 134/78 | HR 77 | Temp 97.6°F | Ht 63.0 in | Wt 189.0 lb

## 2021-01-30 DIAGNOSIS — E119 Type 2 diabetes mellitus without complications: Secondary | ICD-10-CM | POA: Diagnosis not present

## 2021-01-30 DIAGNOSIS — M109 Gout, unspecified: Secondary | ICD-10-CM

## 2021-01-30 DIAGNOSIS — J4540 Moderate persistent asthma, uncomplicated: Secondary | ICD-10-CM | POA: Diagnosis not present

## 2021-01-30 DIAGNOSIS — Z1231 Encounter for screening mammogram for malignant neoplasm of breast: Secondary | ICD-10-CM

## 2021-01-30 DIAGNOSIS — M79643 Pain in unspecified hand: Secondary | ICD-10-CM | POA: Diagnosis not present

## 2021-01-30 DIAGNOSIS — Z23 Encounter for immunization: Secondary | ICD-10-CM

## 2021-01-30 DIAGNOSIS — G8929 Other chronic pain: Secondary | ICD-10-CM

## 2021-01-30 DIAGNOSIS — M2540 Effusion, unspecified joint: Secondary | ICD-10-CM | POA: Diagnosis not present

## 2021-01-30 LAB — C-REACTIVE PROTEIN: CRP: <1 mg/dL (ref 0.5–20.0)

## 2021-01-30 LAB — URIC ACID: Uric Acid, Serum: 8 mg/dL — ABNORMAL HIGH (ref 2.4–7.0)

## 2021-01-30 LAB — SEDIMENTATION RATE: Sed Rate: 36 mm/hr — ABNORMAL HIGH (ref 0–30)

## 2021-01-30 MED ORDER — FLUTICASONE-SALMETEROL 100-50 MCG/ACT IN AEPB: 1.0000 | INHALATION_SPRAY | Freq: Two times a day (BID) | RESPIRATORY_TRACT | 3 refills | Status: DC

## 2021-01-30 MED ORDER — METFORMIN HCL ER 750 MG PO TB24: 750.0000 mg | ORAL_TABLET | Freq: Two times a day (BID) | ORAL | 1 refills | Status: DC

## 2021-01-30 NOTE — Assessment & Plan Note (Signed)
Breo not covered by insurance, she has not called to find out alternatives.   Rx for Advair 100-50 mcg sent to pharmacy.  She will call if insurance will not cover.   Continue PRN albuterol, discussed to only use if needed.

## 2021-01-30 NOTE — Progress Notes (Signed)
Subjective:    Patient ID: Alexandria Leblanc, female    DOB: 1971/01/18, 50 y.o.   MRN: [id number]  HPI  [name] is a very pleasant 50 y.o. female with a history of type 2 diabetes, chronic low back pain, frequent headaches, asthma who presents today to discuss hand pain. Her insurance is not covering Breo inhaler.   She is also due for her shingles and influenza vaccines.    1) Hand Pain:   Her pain is located to the right hand and wrist, began about one month ago. She has numbness to digits 1-4 with pain to the right 4th digit at PIP joint. She's having difficulty at work. She works as a Scientist, water quality, has Copywriter, advertising.   She does have pain to her left hand without numbness. Both hands will swell daily, mostly in the morning.   She has worked in [employer] most of her life, a lot of twisting and turning with her hands, repetitive movement.   She's been taking Ibuprofen 600-800 mg historically with temporary relief. She recently took an Ibuprofen 800 mg without improvement. She does wear braces to bed.   She denies a family history of rheumatoid arthritis, has never been tested for RA.   2) Asthma: Chronic. Prescribed Breo Inhaler, has been without Breo for 6 months as her insurance will not cover. She's using her albuterol inhaler twice daily. She has not tried anything else.   3) Type 2 Diabetes: Managed on Metformin 1000 mg BID for diabetes. Has experienced diarrhea daily shortly after taking each metformin dose. A1C of 6.7 in June 2022. She denies issues with Glipizide.   BP Readings from Last 3 Encounters:  01/30/21 134/78  11/11/20 134/72  10/02/20 132/72      Review of Systems  Constitutional:  Negative for fever.  Respiratory:  Positive for shortness of breath and wheezing.   Cardiovascular:  Negative for chest pain.  Gastrointestinal:  Positive for diarrhea.  Musculoskeletal:  Positive for arthralgias, back pain and joint swelling.  Neurological:   Positive for numbness.        Past Medical History:  Diagnosis Date   Asthma    Genital warts    GERD (gastroesophageal reflux disease)    Hypertension    Migraines    Type 2 diabetes mellitus ([place])     Social History   Socioeconomic History   Marital status: Single    Spouse name: Not on file   Number of children: Not on file   Years of education: Not on file   Highest education level: Not on file  Occupational History   Not on file  Tobacco Use   Smoking status: Every Day    Packs/day: 1.00    Types: Cigarettes   Smokeless tobacco: Never  Substance and Sexual Activity   Alcohol use: Yes    Alcohol/week: 0.0 standard drinks    Comment: occ   Drug use: No   Sexual activity: Not on file  Other Topics Concern   Not on file  Social History Narrative   Single; No children.; 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 1and half a day- smoking; 5 beers a week; no hard liqor.           Social Determinants of Health   Financial Resource Strain: Not on file  Food Insecurity: Not on file  Transportation Needs: Not on file  Physical Activity: Not on file  Stress: Not on file  Social Connections: Not on file  Intimate Partner Violence: Not on file    History reviewed. No pertinent surgical history.  Family History  Problem Relation Age of Onset   Heart disease Mother    Hypertension Mother    Dementia Mother    COPD Father    Hypertension Father    Hyperlipidemia Brother    Prostate cancer Paternal Uncle    Alzheimer's disease Sister     Allergies  Allergen Reactions   Penicillins Hives and Itching   Sulfa Antibiotics     Current Outpatient Medications on File Prior to Visit  Medication Sig Dispense Refill   acyclovir (ZOVIRAX) 400 MG tablet Take 1 tablet (400 mg total) by mouth 2 (two) times daily. For herpes prevention. 180 tablet 3   albuterol (VENTOLIN HFA) 108 (90 Base) MCG/ACT inhaler INHALE 2  PUFFS INTO LUNGS EVERY 6 HOURS AS NEEDED FOR WHEEZING OR SHORTNESS OF BREATH 25.5 g 0   Blood Glucose Monitoring Suppl (FREESTYLE LITE) w/Device KIT 1 each by Does not apply route as directed. DX E11.9 1 kit 0   fluticasone (FLONASE) 50 MCG/ACT nasal spray Place 1 spray into both nostrils 2 (two) times daily. 16 g 3   glipiZIDE (GLIPIZIDE XL) 10 MG 24 hr tablet Take 1 tablet (10 mg total) by mouth daily with breakfast. For diabetes. 90 tablet 3   glucose blood test strip Use to test blood sugars up to three times a day Dx E11.9 200 each 3   hydrochlorothiazide (HYDRODIURIL) 25 MG tablet Take 1 tablet (25 mg total) by mouth daily. For blood pressure. 90 tablet 3   Lancets (FREESTYLE) lancets 1 each 3 (three) times daily.     olmesartan (BENICAR) 20 MG tablet TAKE 1 TABLET BY MOUTH ONCE DAILY FOR BLOOD PRESSURE 90 tablet 2   omeprazole (PRILOSEC) 20 MG capsule TAKE 1 CAPSULE BY MOUTH ONCE DAILY FOR HEARTBURN. 90 capsule 3   rosuvastatin (CRESTOR) 5 MG tablet TAKE 1 TABLET BY MOUTH ONCE A DAY FOR CHOLESTEROL. 90 tablet 3   No current facility-administered medications on file prior to visit.    BP 134/78   Pulse 77   Temp 97.6 F (36.4 C) (Temporal)   Ht $R'5\' 3"'uJ$  (1.6 m)   Wt 189 lb (85.7 kg)   LMP  (LMP Unknown)   SpO2 97%   BMI 33.48 kg/m  Objective:   Physical Exam Cardiovascular:     Rate and Rhythm: Normal rate and regular rhythm.  Pulmonary:     Effort: Pulmonary effort is normal.     Breath sounds: Examination of the right-upper field reveals wheezing. Examination of the left-upper field reveals wheezing. Examination of the right-lower field reveals wheezing. Wheezing present. No rhonchi.  Musculoskeletal:     Cervical back: Neck supple.     Comments: Generalized swelling noted to bilateral hands, more to right side. Swelling to PIP joints of right hand. Unable to make complete fist with right hand.   Skin:    General: Skin is warm and dry.  Neurological:     Comments: Positive  Phalen's sign.   Psychiatric:        Mood and Affect: Mood normal.          Assessment & Plan:      This visit occurred during the SARS-CoV-2 public health emergency.  Safety protocols were in place, including screening questions prior to the visit, additional usage of staff PPE, and extensive cleaning of exam  room while observing appropriate contact time as indicated for disinfecting solutions.

## 2021-01-30 NOTE — Assessment & Plan Note (Signed)
Bilaterally with joint swelling and numbness.  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  Referral placed to orthopedics for evaluation.  Will also check labs today to rule out RA and gout. Labs pending.   Discussed use of braces and other conservative treatment.

## 2021-01-30 NOTE — Assessment & Plan Note (Signed)
Well controlled, however metformin 1000 mg BID seems to be causing GI upset.  Stop metformin 1000 mg BID. Start metformin XR 750 mg BID. Continue Glipizide XL 10 mg.   She will update if symptoms persist.

## 2021-01-30 NOTE — Patient Instructions (Addendum)
Stop by the lab prior to leaving today. I will notify you of your results once received.   You will be contacted regarding your referral to orthopedics.  Please let us know if you have not been contacted within two weeks.   Schedule a visit with me for late December for diabetes check.      Recombinant Zoster (Shingles) Vaccine: What You Need to Know 1. Why get vaccinated? Recombinant zoster (shingles) vaccine can prevent shingles. Shingles (also called herpes zoster, or just zoster) is a painful skin rash, usually with blisters. In addition to the rash, shingles can cause fever, headache, chills, or upset stomach. Rarely, shingles can lead to complications such as pneumonia, hearing problems, blindness, brain inflammation (encephalitis), or death. The risk of shingles increases with age. The most common complication of shingles is long-term nerve pain called postherpetic neuralgia (PHN). PHN occurs in the areas where the shingles rash was and can last for months or years after the rash goes away. The pain from PHN can be severe and debilitating. The risk of PHN increases with age. An older adult with shingles is more likely to develop PHN and have longer lasting and more severe pain than a younger person. People with weakened immune systems also have a higher risk of getting shingles and complications from the disease. Shingles is caused by varicella-zoster virus, the same virus that causes chickenpox. After you have chickenpox, the virus stays in your body and can cause shingles later in life. Shingles cannot be passed from one person to another, but the virus that causes shingles can spread and cause chickenpox in someone who has never had chickenpox or has never received chickenpox vaccine. 2. Recombinant shingles vaccine Recombinant shingles vaccine provides strong protection against shingles. By preventing shingles, recombinant shingles vaccine also protects against PHN and other  complications. Recombinant shingles vaccine is recommended for: Adults 3 years and older Adults 19 years and older who have a weakened immune system because of disease or treatments Shingles vaccine is given as a two-dose series. For most people, the second dose should be given 2 to 6 months after the first dose. Some people who have or will have a weakened immune system can get the second dose 1 to 2 months after the first dose. Ask your health care provider for guidance. People who have had shingles in the past and people who have received varicella (chickenpox) vaccine are recommended to get recombinant shingles vaccine. The vaccine is also recommended for people who have already gotten another type of shingles vaccine, the live shingles vaccine. There is no live virus in recombinant shingles vaccine. Shingles vaccine may be given at the same time as other vaccines. 3. Talk with your health care provider Tell your vaccination provider if the person getting the vaccine: Has had an allergic reaction after a previous dose of recombinant shingles vaccine, or has any severe, life-threatening allergies Is currently experiencing an episode of shingles Is pregnant In some cases, your health care provider may decide to postpone shingles vaccination until a future visit. People with minor illnesses, such as a cold, may be vaccinated. People who are moderately or severely ill should usually wait until they recover before getting recombinant shingles vaccine. Your health care provider can give you more information. 4. Risks of a vaccine reaction A sore arm with mild or moderate pain is very common after recombinant shingles vaccine. Redness and swelling can also happen at the site of the injection. Tiredness, muscle pain, headache, shivering,  fever, stomach pain, and nausea are common after recombinant shingles vaccine. These side effects may temporarily prevent a vaccinated person from doing regular  activities. Symptoms usually go away on their own in 2 to 3 days. You should still get the second dose of recombinant shingles vaccine even if you had one of these reactions after the first dose. Guillain-Barr syndrome (GBS), a serious nervous system disorder, has been reported very rarely after recombinant zoster vaccine. People sometimes faint after medical procedures, including vaccination. Tell your provider if you feel dizzy or have vision changes or ringing in the ears. As with any medicine, there is a very remote chance of a vaccine causing a severe allergic reaction, other serious injury, or death. 5. What if there is a serious problem? An allergic reaction could occur after the vaccinated person leaves the clinic. If you see signs of a severe allergic reaction (hives, swelling of the face and throat, difficulty breathing, a fast heartbeat, dizziness, or weakness), call 9-1-1 and get the person to the nearest hospital. For other signs that concern you, call your health care provider. Adverse reactions should be reported to the Vaccine Adverse Event Reporting System (VAERS). Your health care provider will usually file this report, or you can do it yourself. Visit the VAERS website at www.vaers.SamedayNews.es or call 940-066-8264. VAERS is only for reporting reactions, and VAERS staff members do not give medical advice. 6. How can I learn more? Ask your health care provider. Call your local or state health department. Visit the website of the Food and Drug Administration (FDA) for vaccine package inserts and additional information at http://lopez-wang.org/. Contact the Centers for Disease Control and Prevention (CDC): Call 6040027958 (1-800-CDC-INFO) or Visit CDC's website at http://hunter.com/. Vaccine Information Statement Recombinant Zoster Vaccine (07/04/2020) This information is not intended to replace advice given to you by your health care provider. Make sure you  discuss any questions you have with your health care provider. Document Revised: 07/18/2020 Document Reviewed: 07/18/2020 Elsevier Patient Education  [place].    Influenza (Flu) Vaccine (Inactivated or Recombinant): What You Need to Know 1. Why get vaccinated? Influenza vaccine can prevent influenza (flu). Flu is a contagious disease that spreads around the [place] every year, usually between October and May. Anyone can get the flu, but it is more dangerous for some people. Infants and young children, people 89 years and older, pregnant people, and people with certain health conditions or a weakened immune system are at greatest risk of flu complications. Pneumonia, bronchitis, sinus infections, and ear infections are examples of flu-related complications. If you have a medical condition, such as heart disease, cancer, or diabetes, flu can make it worse. Flu can cause fever and chills, sore throat, muscle aches, fatigue, cough, headache, and runny or stuffy nose. Some people may have vomiting and diarrhea, though this is more common in children than adults. In an average year, thousands of people in the [place] die from flu, and many more are hospitalized. Flu vaccine prevents millions of illnesses and flu-related visits to the doctor each year. 2. Influenza vaccines CDC recommends everyone 6 months and older get vaccinated every flu season. Children 6 months through 87 years of age may need 2 doses during a single flu season. Everyone else needs only 1 dose each flu season. It takes about 2 weeks for protection to develop after vaccination. There are many flu viruses, and they are always changing. Each year a new flu vaccine is made to protect against  the influenza viruses believed to be likely to cause disease in the upcoming flu season. Even when the vaccine doesn't exactly match these viruses, it may still provide some protection. Influenza vaccine does not cause  flu. Influenza vaccine may be given at the same time as other vaccines. 3. Talk with your health care provider Tell your vaccination provider if the person getting the vaccine: Has had an allergic reaction after a previous dose of influenza vaccine, or has any severe, life-threatening allergies Has ever had Guillain-Barr Syndrome (also called "GBS") In some cases, your health care provider may decide to postpone influenza vaccination until a future visit. Influenza vaccine can be administered at any time during pregnancy. People who are or will be pregnant during influenza season should receive inactivated influenza vaccine. People with minor illnesses, such as a cold, may be vaccinated. People who are moderately or severely ill should usually wait until they recover before getting influenza vaccine. Your health care provider can give you more information. 4. Risks of a vaccine reaction Soreness, redness, and swelling where the shot is given, fever, muscle aches, and headache can happen after influenza vaccination. There may be a very small increased risk of Guillain-Barr Syndrome (GBS) after inactivated influenza vaccine (the flu shot). Young children who get the flu shot along with pneumococcal vaccine (PCV13) and/or DTaP vaccine at the same time might be slightly more likely to have a seizure caused by fever. Tell your health care provider if a child who is getting flu vaccine has ever had a seizure. People sometimes faint after medical procedures, including vaccination. Tell your provider if you feel dizzy or have vision changes or ringing in the ears. As with any medicine, there is a very remote chance of a vaccine causing a severe allergic reaction, other serious injury, or death. 5. What if there is a serious problem? An allergic reaction could occur after the vaccinated person leaves the clinic. If you see signs of a severe allergic reaction (hives, swelling of the face and throat,  difficulty breathing, a fast heartbeat, dizziness, or weakness), call 9-1-1 and get the person to the nearest hospital. For other signs that concern you, call your health care provider. Adverse reactions should be reported to the Vaccine Adverse Event Reporting System (VAERS). Your health care provider will usually file this report, or you can do it yourself. Visit the VAERS website at www.vaers.SamedayNews.es or call 770-136-7695. VAERS is only for reporting reactions, and VAERS staff members do not give medical advice. 6. The National Vaccine Injury Compensation Program The [employer] Vaccine Injury Compensation Program (VICP) is a federal program that was created to compensate people who may have been injured by certain vaccines. Claims regarding alleged injury or death due to vaccination have a time limit for filing, which may be as short as two years. Visit the VICP website at GoldCloset.com.ee or call 7824004923 to learn about the program and about filing a claim. 7. How can I learn more? Ask your health care provider. Call your local or state health department. Visit the website of the Food and Drug Administration (FDA) for vaccine package inserts and additional information at TraderRating.uy. Contact the Centers for Disease Control and Prevention (CDC): Call (559)028-2565 (1-800-CDC-INFO) or Visit CDC's website at https://gibson.com/. Vaccine Information Statement Inactivated Influenza Vaccine (01/04/2020) This information is not intended to replace advice given to you by your health care provider. Make sure you discuss any questions you have with your health care provider. Document Revised: 02/21/2020 Document Reviewed: 02/21/2020  Elsevier Patient Education  2022 Elsevier Inc.  

## 2021-02-01 MED ORDER — PREDNISONE 20 MG PO TABS: ORAL_TABLET | ORAL | 0 refills | Status: DC

## 2021-02-03 LAB — CYCLIC CITRUL PEPTIDE ANTIBODY, IGG: Cyclic Citrullin Peptide Ab: <16 UNITS

## 2021-02-03 LAB — RHEUMATOID FACTOR: Rheumatoid fact SerPl-aCnc: <14 IU/mL (ref <14)

## 2021-02-06 ENCOUNTER — Other Ambulatory Visit: Payer: Self-pay | Admitting: Primary Care

## 2021-02-06 DIAGNOSIS — J4540 Moderate persistent asthma, uncomplicated: Secondary | ICD-10-CM

## 2021-02-08 DIAGNOSIS — J4540 Moderate persistent asthma, uncomplicated: Secondary | ICD-10-CM

## 2021-02-09 NOTE — Telephone Encounter (Signed)
Please call to triage patient.

## 2021-02-09 NOTE — Telephone Encounter (Signed)
I spoke with pt; pt started with Advair 01/30/21 and side effects started the first time used advair; Advair used once a day; makes pt have chest pain and heaviness in chest like lungs are closing up. Pt also has burning sensation in lungs when walks. Hard for pt to get her breath and the side effects last all day. Pt said she can do better without Advair; last time used Advair was 02/08/21, pt has not used Advair today and prefers different med sent [facility]. Right now pt is resting and not having any respiratory symptoms; pt will also call her insurance co to see what other med ins co would approve instead of Breo and Advair. UC & ED precautions given and pt voiced understanding.sending note to Gentry Fitz NP and Joellen CMA. Also sending teams to [facility] CMA.

## 2021-02-09 NOTE — Addendum Note (Signed)
Addended by: Francella Solian on: 02/09/2021 03:26 PM   Modules accepted: Orders

## 2021-02-09 NOTE — Telephone Encounter (Signed)
Agree with Triage advice and checking with insurance. Will defer new agent to pcp when she returns

## 2021-02-09 NOTE — Telephone Encounter (Signed)
Patient called in stated that her insurance will cover .Breo medication . Requesting it be sent to [facility] in [place] . Would like a call back.# [phone]

## 2021-02-10 MED ORDER — FLUTICASONE FUROATE-VILANTEROL 100-25 MCG/INH IN AEPB: 1.0000 | INHALATION_SPRAY | Freq: Every day | RESPIRATORY_TRACT | 11 refills | Status: DC

## 2021-02-10 NOTE — Addendum Note (Signed)
Addended by: Pleas Koch on: 02/10/2021 02:08 PM   Modules accepted: Orders

## 2021-02-23 ENCOUNTER — Other Ambulatory Visit: Payer: Self-pay

## 2021-02-23 ENCOUNTER — Other Ambulatory Visit (INDEPENDENT_AMBULATORY_CARE_PROVIDER_SITE_OTHER): Payer: Managed Care, Other (non HMO)

## 2021-02-23 DIAGNOSIS — M109 Gout, unspecified: Secondary | ICD-10-CM | POA: Diagnosis not present

## 2021-02-23 LAB — URIC ACID: Uric Acid, Serum: 5.5 mg/dL (ref 2.4–7.0)

## 2021-02-24 DIAGNOSIS — J4540 Moderate persistent asthma, uncomplicated: Secondary | ICD-10-CM

## 2021-02-24 MED ORDER — BUDESONIDE-FORMOTEROL FUMARATE 160-4.5 MCG/ACT IN AERO: 1.0000 | INHALATION_SPRAY | Freq: Two times a day (BID) | RESPIRATORY_TRACT | 3 refills | Status: DC

## 2021-02-25 MED ORDER — BUDESONIDE-FORMOTEROL FUMARATE 160-4.5 MCG/ACT IN AERO: 1.0000 | INHALATION_SPRAY | Freq: Two times a day (BID) | RESPIRATORY_TRACT | 3 refills | Status: DC

## 2021-02-25 NOTE — Telephone Encounter (Signed)
Alexandria Leblanc from [place] called stating that could you change the Budesonide to 1 to 2 puffs twice a day because of the cost.

## 2021-03-09 NOTE — Telephone Encounter (Signed)
Do you want her to come in for evaluation or are you aware of this?

## 2021-03-11 ENCOUNTER — Encounter: Payer: Self-pay | Admitting: Primary Care

## 2021-03-11 ENCOUNTER — Ambulatory Visit: Payer: Managed Care, Other (non HMO) | Admitting: Primary Care

## 2021-03-11 ENCOUNTER — Other Ambulatory Visit: Payer: Self-pay

## 2021-03-11 ENCOUNTER — Ambulatory Visit (INDEPENDENT_AMBULATORY_CARE_PROVIDER_SITE_OTHER): Payer: Managed Care, Other (non HMO)

## 2021-03-11 VITALS — BP 132/76 | HR 76 | Temp 97.6°F | Ht 63.0 in | Wt 186.0 lb

## 2021-03-11 DIAGNOSIS — R0789 Other chest pain: Secondary | ICD-10-CM

## 2021-03-11 DIAGNOSIS — J4540 Moderate persistent asthma, uncomplicated: Secondary | ICD-10-CM | POA: Diagnosis not present

## 2021-03-11 DIAGNOSIS — K219 Gastro-esophageal reflux disease without esophagitis: Secondary | ICD-10-CM

## 2021-03-11 DIAGNOSIS — F17200 Nicotine dependence, unspecified, uncomplicated: Secondary | ICD-10-CM | POA: Diagnosis not present

## 2021-03-11 IMAGING — DX DG CHEST 2V
2 series · 2 of 2 positions shown · non-contrast
Comparison: [DATE]

CLINICAL DATA: Chest pressure.

EXAM:
CHEST - 2 VIEW

[chest pa]
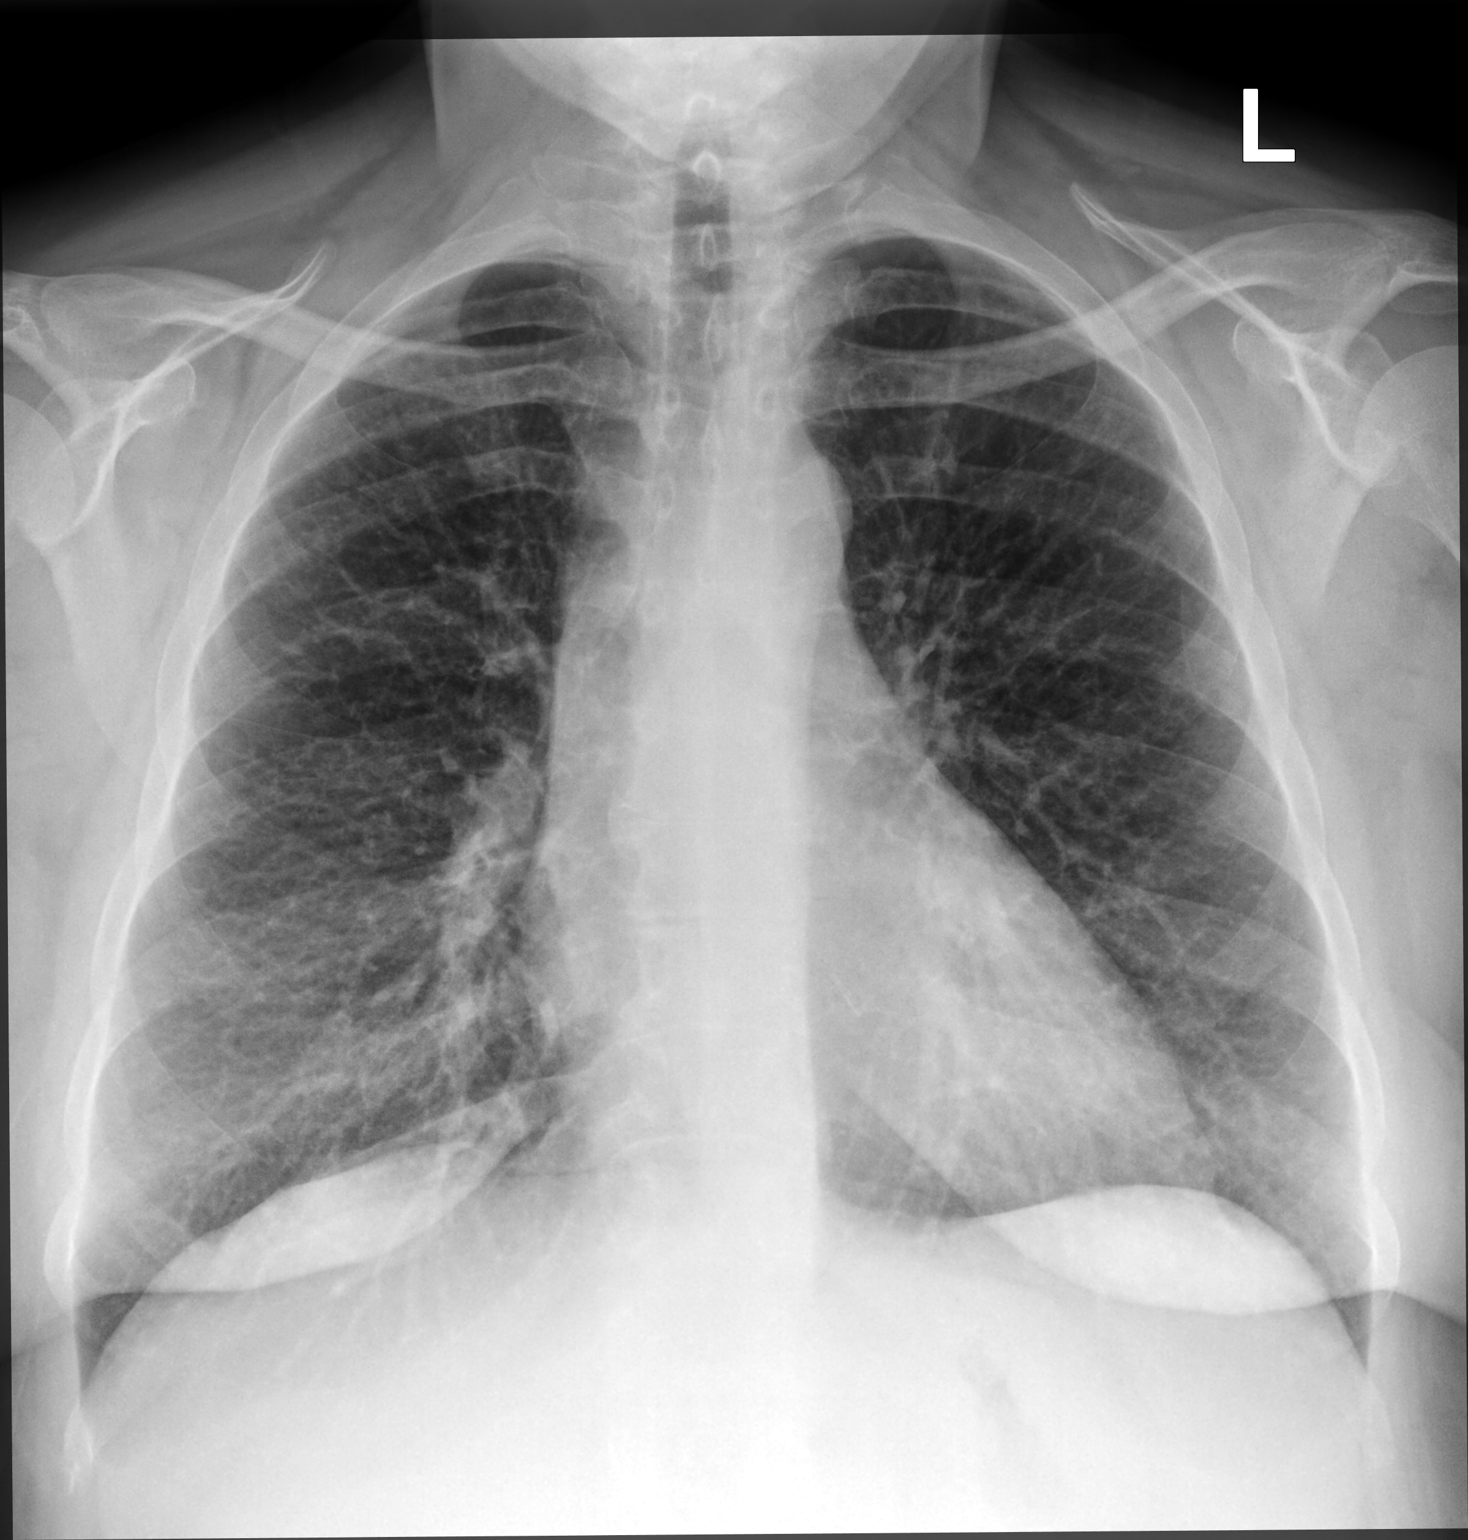

[chest lat]
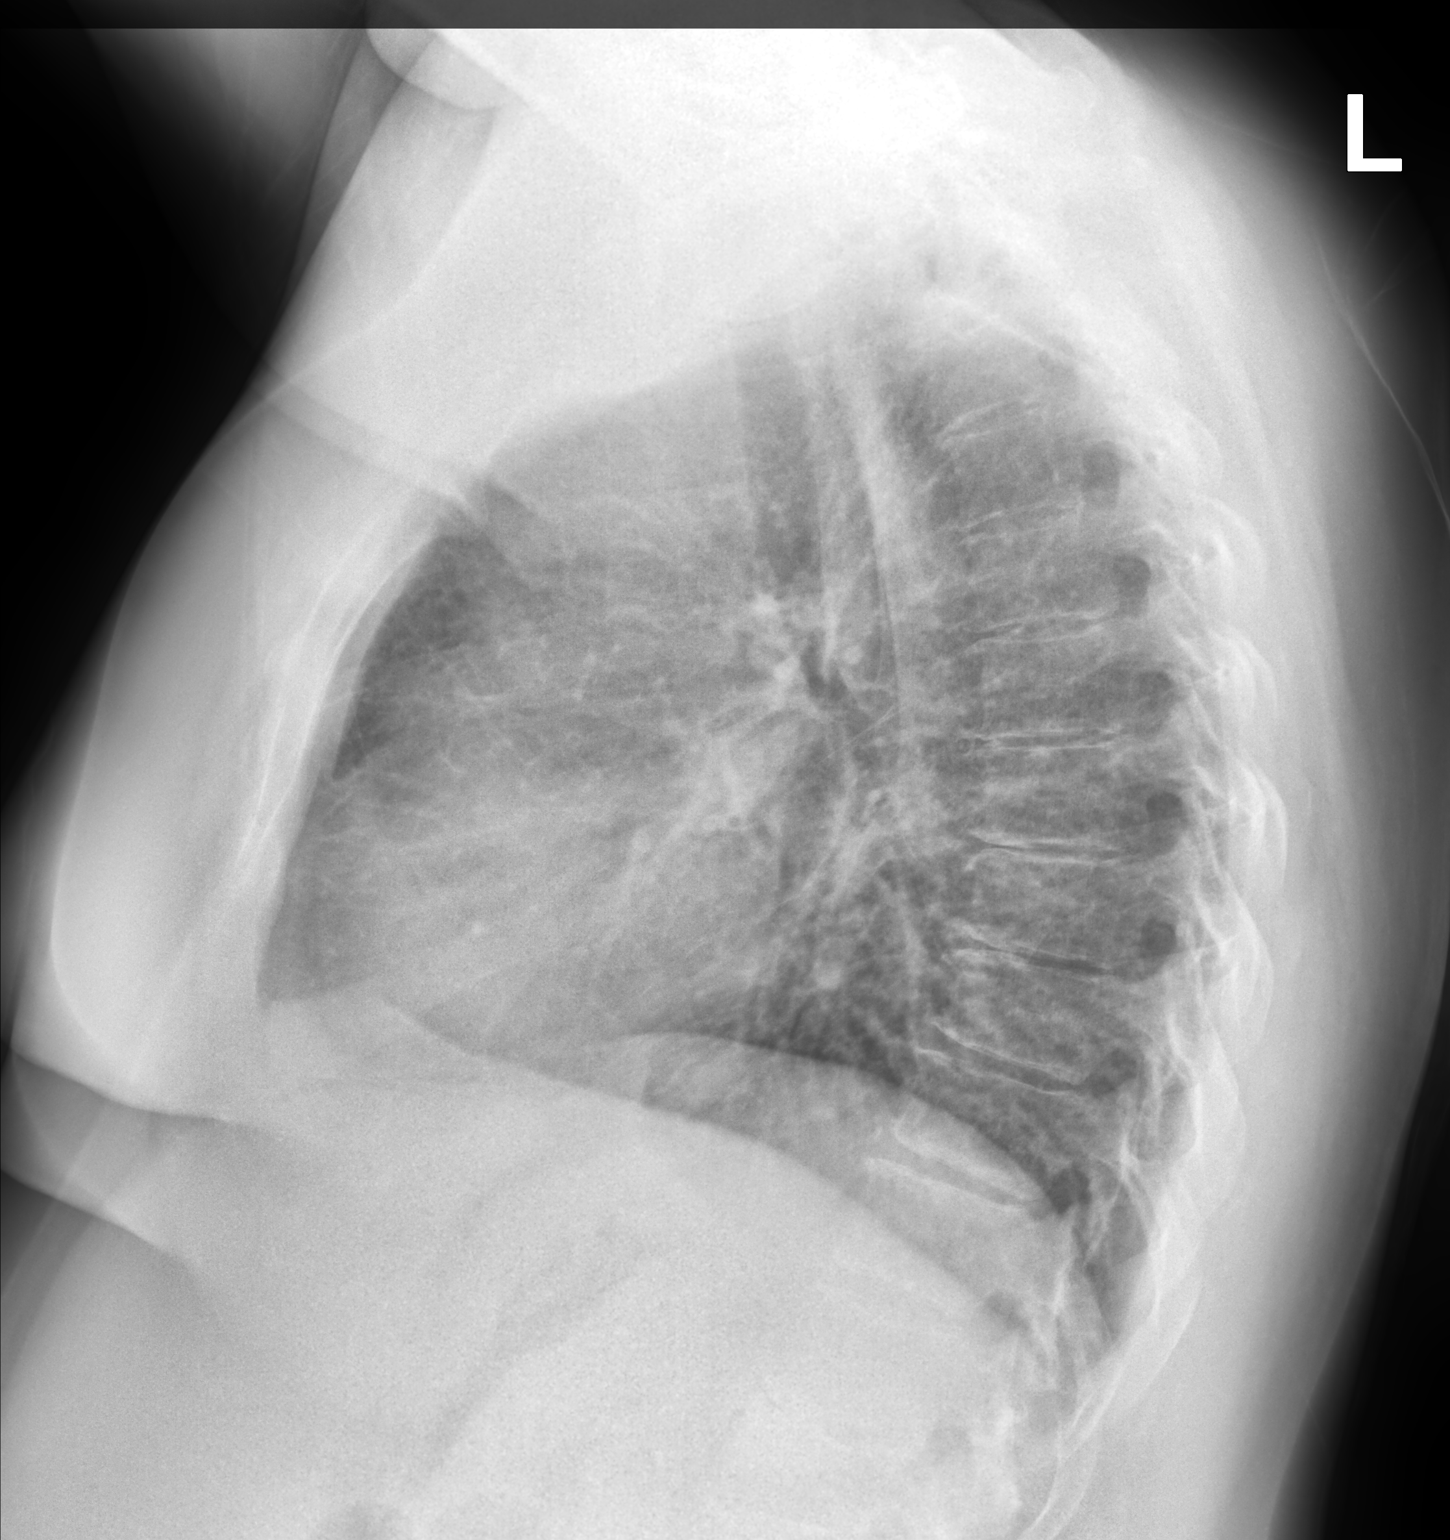

[2 of 2 positions shown; findings below may reference images not displayed]

FINDINGS: The heart size and mediastinal contours are within normal limits.
Both lungs are clear. The visualized skeletal structures are
unremarkable.
IMPRESSION: No active cardiopulmonary disease.

## 2021-03-11 MED ORDER — OMEPRAZOLE 40 MG PO CPDR: 40.0000 mg | DELAYED_RELEASE_CAPSULE | Freq: Two times a day (BID) | ORAL | 0 refills | Status: DC

## 2021-03-11 NOTE — Progress Notes (Signed)
Subjective:    Patient ID: Alexandria Leblanc, female    DOB: Apr 10, 1971, 50 y.o.   MRN: [id number]  HPI  Alexandria Leblanc is a very pleasant 50 y.o. female with a history of tobacco abuse, moderate persistent asthma, hypertension, GERD, type 2 diabetes who presents today to discuss esophageal burning and cough.   Symptoms began about 6 weeks ago shortly after using her Advair inhaler for asthma. Prior to this she had been off of her inhalers for months due to cost.   She then began [place] as she had done well on this historically, symptoms continued so she stopped taking Breo.  Since then she continues to notice her burning sensation which is located to her throat with anterior chest pressure to her left arm. She did experience vomiting for one episode. Cold air, strong smells, deep inspiration, light activity causes chest burning with shortness of breath. She's been using albuterol twice daily for the most part, continues to notice symptoms.   She's been compliant to her omeprazole 20 mg daily, has began taking 40 mg as of one week ago and has noticed some improvement in symptoms.   She is a current smoker, 52 pack year history.    Review of Systems  Constitutional:  Negative for fever.  Respiratory:  Positive for shortness of breath and wheezing. Negative for cough.   Gastrointestinal:        Throat burning, chest burning and pressure.        Past Medical History:  Diagnosis Date   Asthma    Genital warts    GERD (gastroesophageal reflux disease)    Hypertension    Migraines    Type 2 diabetes mellitus ([place])     Social History   Socioeconomic History   Marital status: Single    Spouse name: Not on file   Number of children: Not on file   Years of education: Not on file   Highest education level: Not on file  Occupational History   Not on file  Tobacco Use   Smoking status: Every Day    Packs/day: 1.00    Types: Cigarettes   Smokeless tobacco: Never  Substance and  Sexual Activity   Alcohol use: Yes    Alcohol/week: 0.0 standard drinks    Comment: occ   Drug use: No   Sexual activity: Not on file  Other Topics Concern   Not on file  Social History Narrative   Single; No children.; 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 1and half a day- smoking; 5 beers a week; no hard liqor.           Social Determinants of Health   Financial Resource Strain: Not on file  Food Insecurity: Not on file  Transportation Needs: Not on file  Physical Activity: Not on file  Stress: Not on file  Social Connections: Not on file  Intimate Partner Violence: Not on file    History reviewed. No pertinent surgical history.  Family History  Problem Relation Age of Onset   Heart disease Mother    Hypertension Mother    Dementia Mother    COPD Father    Hypertension Father    Hyperlipidemia Brother    Prostate cancer Paternal Uncle    Alzheimer's disease Sister     Allergies  Allergen Reactions   Penicillins Hives and Itching   Sulfa Antibiotics     Current Outpatient Medications on File  Prior to Visit  Medication Sig Dispense Refill   acyclovir (ZOVIRAX) 400 MG tablet Take 1 tablet (400 mg total) by mouth 2 (two) times daily. For herpes prevention. 180 tablet 3   albuterol (VENTOLIN HFA) 108 (90 Base) MCG/ACT inhaler INHALE 2 PUFFS INTO LUNGS EVERY 6 HOURS AS NEEDED FOR WHEEZING OR SHORTNESS OF BREATH 6.7 g 0   Blood Glucose Monitoring Suppl (FREESTYLE LITE) w/Device KIT 1 each by Does not apply route as directed. DX E11.9 1 kit 0   fluticasone (FLONASE) 50 MCG/ACT nasal spray Place 1 spray into both nostrils 2 (two) times daily. 16 g 3   glipiZIDE (GLIPIZIDE XL) 10 MG 24 hr tablet Take 1 tablet (10 mg total) by mouth daily with breakfast. For diabetes. 90 tablet 3   glucose blood test strip Use to test blood sugars up to three times a day Dx E11.9 200 each 3   hydrochlorothiazide (HYDRODIURIL) 25 MG  tablet Take 1 tablet (25 mg total) by mouth daily. For blood pressure. 90 tablet 3   Lancets (FREESTYLE) lancets 1 each 3 (three) times daily.     metFORMIN (GLUCOPHAGE-XR) 750 MG 24 hr tablet Take 1 tablet (750 mg total) by mouth 2 (two) times daily with a meal. For diabetes. 180 tablet 1   olmesartan (BENICAR) 20 MG tablet TAKE 1 TABLET BY MOUTH ONCE DAILY FOR BLOOD PRESSURE 90 tablet 2   omeprazole (PRILOSEC) 20 MG capsule TAKE 1 CAPSULE BY MOUTH ONCE DAILY FOR HEARTBURN. 90 capsule 3   budesonide-formoterol (SYMBICORT) 160-4.5 MCG/ACT inhaler Inhale 1-2 puffs into the lungs 2 (two) times daily. (Patient not taking: Reported on 03/11/2021) 1 each 3   rosuvastatin (CRESTOR) 5 MG tablet TAKE 1 TABLET BY MOUTH ONCE A DAY FOR CHOLESTEROL. (Patient not taking: Reported on 03/11/2021) 90 tablet 3   No current facility-administered medications on file prior to visit.    BP 132/76   Pulse 76   Temp 97.6 F (36.4 C) (Temporal)   Ht $R'5\' 3"'te$  (1.6 m)   Wt 186 lb (84.4 kg)   LMP  (LMP Unknown)   SpO2 98%   BMI 32.95 kg/m  Objective:   Physical Exam Cardiovascular:     Rate and Rhythm: Normal rate and regular rhythm.  Pulmonary:     Effort: Pulmonary effort is normal.     Breath sounds: Examination of the right-upper field reveals wheezing. Examination of the left-upper field reveals wheezing. Wheezing present.  Musculoskeletal:     Cervical back: Neck supple.  Skin:    General: Skin is warm and dry.          Assessment & Plan:      This visit occurred during the SARS-CoV-2 public health emergency.  Safety protocols were in place, including screening questions prior to the visit, additional usage of staff PPE, and extensive cleaning of exam room while observing appropriate contact time as indicated for disinfecting solutions.

## 2021-03-11 NOTE — Assessment & Plan Note (Addendum)
Current smoker with 52 pack year history.  Encouraged to cut back/quit smoking.   Ambulatory referral for lung cancer screening program ordered.   Chest Xray pending given symptoms today.  I evaluated patient, was consulted regarding treatment, and agree with assessment and plan per Budd Palmer, RN, DNP student.   Allie Bossier, NP-C

## 2021-03-11 NOTE — Assessment & Plan Note (Addendum)
Uncontrolled, mild bilateral wheezing on exam today, but stable.   Chest Xray pending to rule out infectious cause and also in light of her heavy tobacco use.   Refrain from Symbicort 160-4.5 mcg/ACT or Breo until chest Xray results and until other symptoms resolve.  Suspect symptoms today are secondary to GERD, especially since symptoms have not improved despite removal of LABA/ICS weeks ago. Advair may have triggered GERD with powder formulation.  Continue PRN Albuterol as needed. She is stable in the office today.   I evaluated patient, was consulted regarding treatment, and agree with assessment and plan per Budd Palmer, RN, DNP student.   Allie Bossier, NP-C

## 2021-03-11 NOTE — Progress Notes (Signed)
Established Patient Office Visit  Subjective:  Patient ID: Alexandria Leblanc, female    DOB: Aug 18, 1970  Age: 50 y.o. MRN: [id number]  CC: No chief complaint on file.   HPI Alexandria Leblanc is a 50 year old female with a past medical history of GERD, asthma, and current smoking with a 52-pack year history presents for burning with cough. Onset was 6 weeks ago, shortly after starting Advair.She stopped taking it 2 days after starting it in September 2022. She switched back to [facility], but the symptoms persisted and she stopped this as well. Symbicort was prescribed but she hasn't taken it yet related to fear of her symptoms becoming worse. She typically uses her Albuterol 2 times daily but has tried not to use it since the onset of this pain. She has a productive cough with thick white sputum, but this is baseline for her.   Triggers for the pain are cold air, strong smalls, light activity such as walking, deep inspiration. It is not disruptive to her sleep. Humidification helps. She has associated frontal headaches with the pain. She takes 2 [place] powders for this and it is not helpful. She also has chest tightness which radiates to the left shoulder.   She recently increased her omeprazole to 2 tablets daily from one, which she had some relief with. The pain is not worse laying down or after meals.    Past Medical History:  Diagnosis Date   Asthma    Genital warts    GERD (gastroesophageal reflux disease)    Hypertension    Migraines    Type 2 diabetes mellitus ([place])     No past surgical history on file.  Family History  Problem Relation Age of Onset   Heart disease Mother    Hypertension Mother    Dementia Mother    COPD Father    Hypertension Father    Hyperlipidemia Brother    Prostate cancer Paternal Uncle    Alzheimer's disease Sister     Social History   Socioeconomic History   Marital status: Single    Spouse name: Not on file   Number of children: Not on file    Years of education: Not on file   Highest education level: Not on file  Occupational History   Not on file  Tobacco Use   Smoking status: Every Day    Packs/day: 1.00    Types: Cigarettes   Smokeless tobacco: Never  Substance and Sexual Activity   Alcohol use: Yes    Alcohol/week: 0.0 standard drinks    Comment: occ   Drug use: No   Sexual activity: Not on file  Other Topics Concern   Not on file  Social History Narrative   Single; No children.; 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 1and half a day- smoking; 5 beers a week; no hard liqor.           Social Determinants of Health   Financial Resource Strain: Not on file  Food Insecurity: Not on file  Transportation Needs: Not on file  Physical Activity: Not on file  Stress: Not on file  Social Connections: Not on file  Intimate Partner Violence: Not on file    Outpatient Medications Prior to Visit  Medication Sig Dispense Refill   acyclovir (ZOVIRAX) 400 MG tablet Take 1 tablet (400 mg total) by mouth 2 (two) times daily. For herpes prevention. 180 tablet 3  albuterol (VENTOLIN HFA) 108 (90 Base) MCG/ACT inhaler INHALE 2 PUFFS INTO LUNGS EVERY 6 HOURS AS NEEDED FOR WHEEZING OR SHORTNESS OF BREATH 6.7 g 0   Blood Glucose Monitoring Suppl (FREESTYLE LITE) w/Device KIT 1 each by Does not apply route as directed. DX E11.9 1 kit 0   budesonide-formoterol (SYMBICORT) 160-4.5 MCG/ACT inhaler Inhale 1-2 puffs into the lungs 2 (two) times daily. 1 each 3   fluticasone (FLONASE) 50 MCG/ACT nasal spray Place 1 spray into both nostrils 2 (two) times daily. 16 g 3   glipiZIDE (GLIPIZIDE XL) 10 MG 24 hr tablet Take 1 tablet (10 mg total) by mouth daily with breakfast. For diabetes. 90 tablet 3   glucose blood test strip Use to test blood sugars up to three times a day Dx E11.9 200 each 3   hydrochlorothiazide (HYDRODIURIL) 25 MG tablet Take 1 tablet (25 mg total) by mouth daily. For  blood pressure. 90 tablet 3   Lancets (FREESTYLE) lancets 1 each 3 (three) times daily.     metFORMIN (GLUCOPHAGE-XR) 750 MG 24 hr tablet Take 1 tablet (750 mg total) by mouth 2 (two) times daily with a meal. For diabetes. 180 tablet 1   olmesartan (BENICAR) 20 MG tablet TAKE 1 TABLET BY MOUTH ONCE DAILY FOR BLOOD PRESSURE 90 tablet 2   omeprazole (PRILOSEC) 20 MG capsule TAKE 1 CAPSULE BY MOUTH ONCE DAILY FOR HEARTBURN. 90 capsule 3   predniSONE (DELTASONE) 20 MG tablet Take two tablets my mouth once daily for four days, then one tablet once daily for four days. 12 tablet 0   rosuvastatin (CRESTOR) 5 MG tablet TAKE 1 TABLET BY MOUTH ONCE A DAY FOR CHOLESTEROL. 90 tablet 3   No facility-administered medications prior to visit.    Allergies  Allergen Reactions   Penicillins Hives and Itching   Sulfa Antibiotics     ROS Review of Systems  Constitutional: Negative.   HENT:  Negative for congestion and postnasal drip.   Respiratory:  Positive for cough and chest tightness.        Productive cough is baseline, chest tightness is acute x 6 weeks  Cardiovascular:  Negative for palpitations.  Skin: Negative.   Neurological: Negative.      Objective:    Physical Exam Constitutional:      Appearance: Normal appearance.  Cardiovascular:     Rate and Rhythm: Normal rate and regular rhythm.     Heart sounds: Normal heart sounds.  Pulmonary:     Breath sounds: Wheezing present.     Comments: Upper lobes bilaterally Musculoskeletal:     Cervical back: Neck supple.  Skin:    General: Skin is warm and dry.  Neurological:     General: No focal deficit present.     Mental Status: She is alert and oriented to person, place, and time.  Psychiatric:        Mood and Affect: Mood normal.        Behavior: Behavior normal.    LMP  (LMP Unknown)  Wt Readings from Last 3 Encounters:  01/30/21 189 lb (85.7 kg)  11/11/20 186 lb (84.4 kg)  10/02/20 188 lb 8 oz (85.5 kg)     Health  Maintenance Due  Topic Date Due   COLONOSCOPY (Pts 45-49yrs Insurance coverage will need to be confirmed)  Never done   FOOT EXAM  03/14/2019   OPHTHALMOLOGY EXAM  03/25/2019   MAMMOGRAM  Never done    There are no preventive care reminders to  display for this patient.  Lab Results  Component Value Date   TSH 1.64 03/22/2017   Lab Results  Component Value Date   WBC 6.0 11/11/2020   HGB 13.8 11/11/2020   HCT 41.1 11/11/2020   MCV 91.7 11/11/2020   PLT 112.0 (L) 11/11/2020   Lab Results  Component Value Date   NA 135 11/11/2020   K 4.2 11/11/2020   CO2 25 11/11/2020   GLUCOSE 145 (H) 11/11/2020   BUN 8 11/11/2020   CREATININE 0.56 11/11/2020   BILITOT 0.3 11/11/2020   ALKPHOS 111 11/11/2020   AST 25 11/11/2020   ALT 24 11/11/2020   PROT 7.8 11/11/2020   ALBUMIN 4.1 11/11/2020   CALCIUM 9.3 11/11/2020   ANIONGAP 11 01/03/2020   GFR 106.50 11/11/2020   Lab Results  Component Value Date   CHOL 153 11/11/2020   Lab Results  Component Value Date   HDL 34.90 (L) 11/11/2020   Lab Results  Component Value Date   LDLCALC 98 03/22/2017   Lab Results  Component Value Date   TRIG 250.0 (H) 11/11/2020   Lab Results  Component Value Date   CHOLHDL 4 11/11/2020   Lab Results  Component Value Date   HGBA1C 6.7 (A) 11/11/2020      Assessment & Plan:   Problem List Items Addressed This Visit   None   No orders of the defined types were placed in this encounter.   Follow-up: No follow-ups on file.    Ninfa Meeker, RN

## 2021-03-11 NOTE — Patient Instructions (Addendum)
Please go to Xray for your chest xray Start taking 2 capsules of Omeprazole 2 times per day for heartburn.  You will get a phone call about your lung cancer CT scan.  Please consider cutting back smoking with eventual goal of quitting.    Smoking Tobacco Information, Adult Smoking tobacco can be harmful to your health. Tobacco contains a poisonous (toxic), colorless chemical called nicotine. Nicotine is addictive. It changes the brain and can make it hard to stop smoking. Tobacco also has other toxic chemicals that can hurt your body and raise your risk of many cancers. How can smoking tobacco affect me? Smoking tobacco puts you at risk for: Cancer. Smoking is most commonly associated with lung cancer, but can also lead to cancer in other parts of the body. Chronic obstructive pulmonary disease (COPD). This is a long-term lung condition that makes it hard to breathe. It also gets worse over time. High blood pressure (hypertension), heart disease, stroke, or heart attack. Lung infections, such as pneumonia. Cataracts. This is when the lenses in the eyes become clouded. Digestive problems. This may include peptic ulcers, heartburn, and gastroesophageal reflux disease (GERD). Oral health problems, such as gum disease and tooth loss. Loss of taste and smell. Smoking can affect your appearance by causing: Wrinkles. Yellow or stained teeth, fingers, and fingernails. Smoking tobacco can also affect your social life, because: It may be challenging to find places to smoke when away from home. Many workplaces, [employer], hotels, and public places are tobacco-free. Smoking is expensive. This is due to the cost of tobacco and the long-term costs of treating health problems from smoking. Secondhand smoke may affect those around you. Secondhand smoke can cause lung cancer, breathing problems, and heart disease. Children of smokers have a higher risk for: Sudden infant death syndrome (SIDS). Ear  infections. Lung infections. If you currently smoke tobacco, quitting now can help you: Lead a longer and healthier life. Look, smell, breathe, and feel better over time. Save money. Protect others from the harms of secondhand smoke. What actions can I take to prevent health problems? Quit smoking  Do not start smoking. Quit if you already do. Make a plan to quit smoking and commit to it. Look for programs to help you and ask your health care provider for recommendations and ideas. Set a date and write down all the reasons you want to quit. Let your friends and family know you are quitting so they can help and support you. Consider finding friends who also want to quit. It can be easier to quit with someone else, so that you can support each other. Talk with your health care provider about using nicotine replacement medicines to help you quit, such as gum, lozenges, patches, sprays, or pills. Do not replace cigarette smoking with electronic cigarettes, which are commonly called e-cigarettes. The safety of e-cigarettes is not known, and some may contain harmful chemicals. If you try to quit but return to smoking, stay positive. It is common to slip up when you first quit, so take it one day at a time. Be prepared for cravings. When you feel the urge to smoke, chew gum or suck on hard candy. Lifestyle Stay busy and take care of your body. Drink enough fluid to keep your urine pale yellow. Get plenty of exercise and eat a healthy diet. This can help prevent weight gain after quitting. Monitor your eating habits. Quitting smoking can cause you to have a larger appetite than when you smoke. Find ways  to relax. Go out with friends or family to a movie or a restaurant where people do not smoke. Ask your health care provider about having regular tests (screenings) to check for cancer. This may include blood tests, imaging tests, and other tests. Find ways to manage your stress, such as meditation,  yoga, or exercise. Where to find support To get support to quit smoking, consider: Asking your health care provider for more information and resources. Taking classes to learn more about quitting smoking. Looking for local organizations that offer resources about quitting smoking. Joining a support group for people who want to quit smoking in your local community. Calling the smokefree.gov counselor helpline: 1-800-Quit-Now 646-258-1446) Where to find more information You may find more information about quitting smoking from: HelpGuide.org: www.helpguide.org https://hall.com/: smokefree.gov American Lung Association: www.lung.org Contact a health care provider if you: Have problems breathing. Notice that your lips, nose, or fingers turn blue. Have chest pain. Are coughing up blood. Feel faint or you pass out. Have other health changes that cause you to worry. Summary Smoking tobacco can negatively affect your health, the health of those around you, your finances, and your social life. Do not start smoking. Quit if you already do. If you need help quitting, ask your health care provider. Think about joining a support group for people who want to quit smoking in your local community. There are many effective programs that will help you to quit this behavior. This information is not intended to replace advice given to you by your health care provider. Make sure you discuss any questions you have with your health care provider. Document Revised: 08/06/2020 Document Reviewed: 04/08/2020 Elsevier Patient Education  2022 [employer].

## 2021-03-11 NOTE — Assessment & Plan Note (Addendum)
Uncontrolled, suspect that acute symptoms today are related to medication-induced GERD exacerbation after Advair was started.  Increase dose of Omeprazole to 40 mg twice daily temporarily.   Encouraged to cut back/quit smoking.  Chest Xray pending EKG ordered to rule our cardiac etiology, unremarkable, normal sinus rhythm today with rate of 66. No PAC/PVC, acute ST changes.   She will update.   I evaluated patient, was consulted regarding treatment, and agree with assessment and plan per Budd Palmer, RN, DNP student.   Allie Bossier, NP-C

## 2021-03-14 DIAGNOSIS — E785 Hyperlipidemia, unspecified: Secondary | ICD-10-CM

## 2021-03-15 MED ORDER — PRAVASTATIN SODIUM 40 MG PO TABS: 40.0000 mg | ORAL_TABLET | Freq: Every day | ORAL | 2 refills | Status: DC

## 2021-04-10 ENCOUNTER — Other Ambulatory Visit: Payer: Self-pay | Admitting: Primary Care

## 2021-04-10 DIAGNOSIS — K219 Gastro-esophageal reflux disease without esophagitis: Secondary | ICD-10-CM

## 2021-04-20 ENCOUNTER — Encounter: Payer: Self-pay | Admitting: Family Medicine

## 2021-04-21 DIAGNOSIS — M5416 Radiculopathy, lumbar region: Secondary | ICD-10-CM

## 2021-04-21 DIAGNOSIS — G8929 Other chronic pain: Secondary | ICD-10-CM

## 2021-04-28 ENCOUNTER — Other Ambulatory Visit: Payer: Self-pay | Admitting: *Deleted

## 2021-04-28 DIAGNOSIS — F17210 Nicotine dependence, cigarettes, uncomplicated: Secondary | ICD-10-CM

## 2021-04-30 ENCOUNTER — Other Ambulatory Visit: Payer: Self-pay | Admitting: Primary Care

## 2021-04-30 DIAGNOSIS — J4540 Moderate persistent asthma, uncomplicated: Secondary | ICD-10-CM

## 2021-05-05 ENCOUNTER — Encounter: Payer: Self-pay | Admitting: *Deleted

## 2021-05-06 NOTE — Telephone Encounter (Signed)
Spoke with patient at length on the phone regarding all of her upcoming appts  12/15 -- MRI Lumbar - at [place] location  12/16 -- virtual visit with Pulmonary Lung Cancer Program 12/16 -- Low Dose CT scan Lung Cancer Screening  Patient aware that she needs to call Dr Aris Lot with Neurosurgery to discuss setting up her consultation for her back pain - all records faxed.   Nothing further needed.

## 2021-05-06 NOTE — Telephone Encounter (Signed)
Noted and appreciate the phone call!

## 2021-05-07 ENCOUNTER — Other Ambulatory Visit: Payer: Self-pay | Admitting: Primary Care

## 2021-05-07 DIAGNOSIS — J4540 Moderate persistent asthma, uncomplicated: Secondary | ICD-10-CM

## 2021-05-08 ENCOUNTER — Other Ambulatory Visit: Payer: Self-pay | Admitting: Primary Care

## 2021-05-08 DIAGNOSIS — J4540 Moderate persistent asthma, uncomplicated: Secondary | ICD-10-CM

## 2021-05-13 NOTE — Telephone Encounter (Signed)
Pt is scheduled with Neurosurgery 05/14/21 at [place], [place]  [facility] [place], [place] [id number]

## 2021-05-14 ENCOUNTER — Other Ambulatory Visit: Payer: Managed Care, Other (non HMO)

## 2021-05-15 ENCOUNTER — Other Ambulatory Visit: Payer: Self-pay

## 2021-05-15 ENCOUNTER — Telehealth (INDEPENDENT_AMBULATORY_CARE_PROVIDER_SITE_OTHER): Payer: Managed Care, Other (non HMO) | Admitting: Acute Care

## 2021-05-15 ENCOUNTER — Encounter: Payer: Self-pay | Admitting: Acute Care

## 2021-05-15 ENCOUNTER — Ambulatory Visit
Admission: RE | Admit: 2021-05-15 | Discharge: 2021-05-15 | Disposition: A | Discharge status: Home / Self Care | Payer: Managed Care, Other (non HMO) | Source: Ambulatory Visit | Attending: Acute Care | Admitting: Acute Care

## 2021-05-15 DIAGNOSIS — F17210 Nicotine dependence, cigarettes, uncomplicated: Secondary | ICD-10-CM

## 2021-05-15 IMAGING — CT CT CHEST LUNG CANCER SCREENING LOW DOSE W/O CM
2 of 5 series · 15 of 40 positions shown, 18 images · non-contrast
Comparison: None.

CLINICAL DATA: Current smoker, 35 pack-year history.

EXAM:
CT CHEST WITHOUT CONTRAST LOW-DOSE FOR LUNG CANCER SCREENING
TECHNIQUE: Multidetector CT imaging of the chest was performed following the
standard protocol without IV contrast.

[Series 3: lung 1.00 · axial · 0.65mm/px · z∈[-1208,-914]mm · 12 of 324 slices shown, 15 images]
[im 15/324  mediastinal]
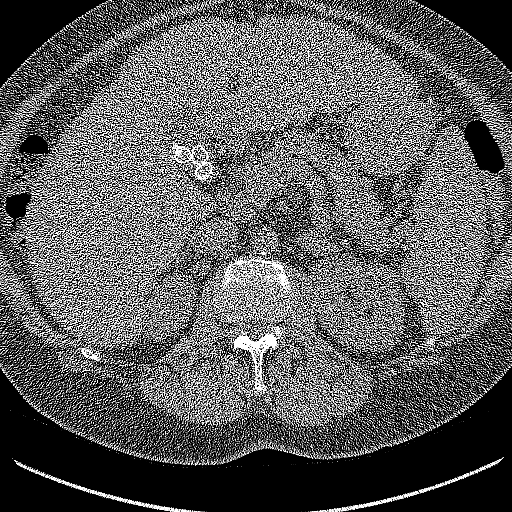
[im 15/324  lung]
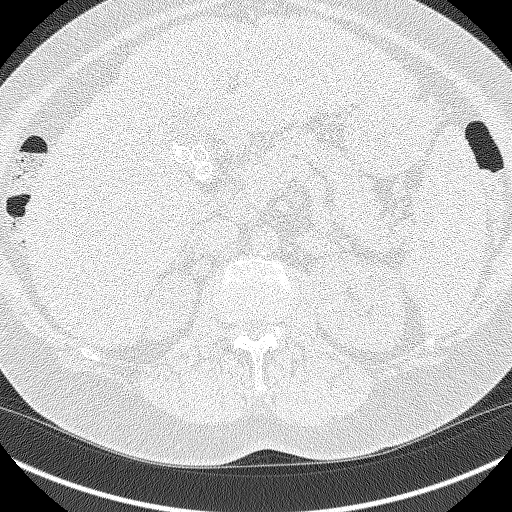
[im 45/324  lung]
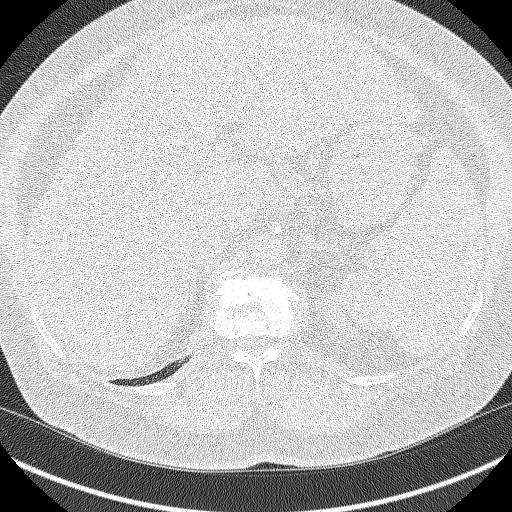
[im 74/324  lung]
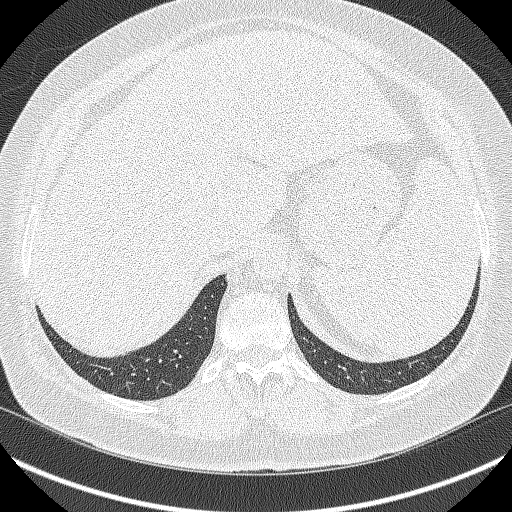
[im 103/324  lung]
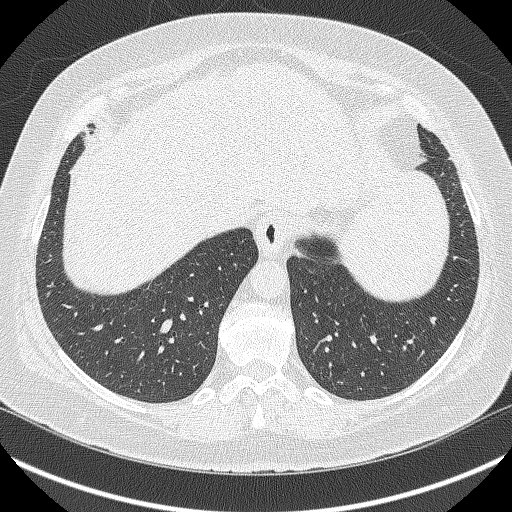
[im 118/324  mediastinal]
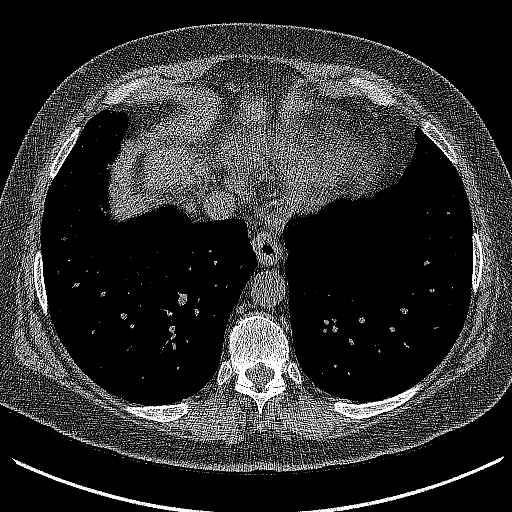
[im 118/324  lung]
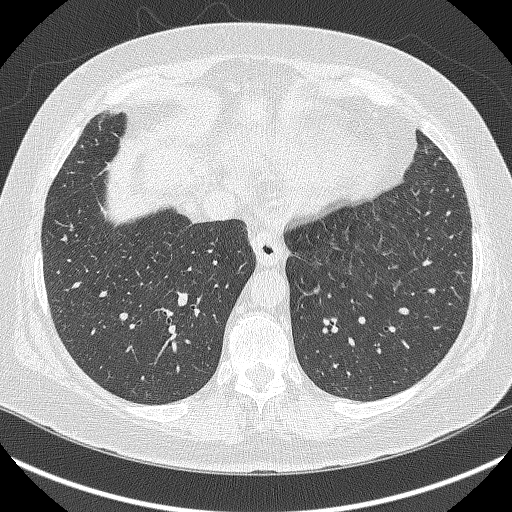
[im 147/324  lung]
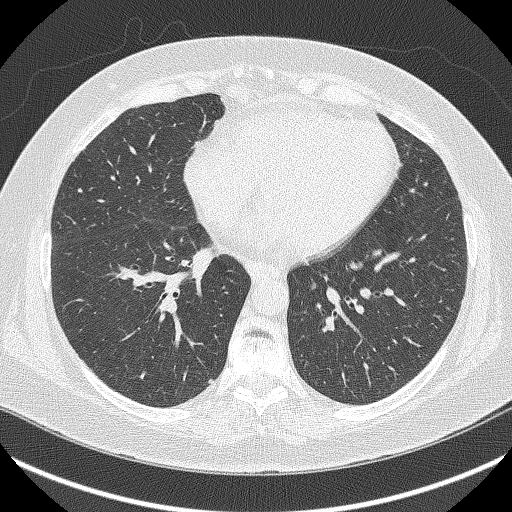
[im 177/324  lung]
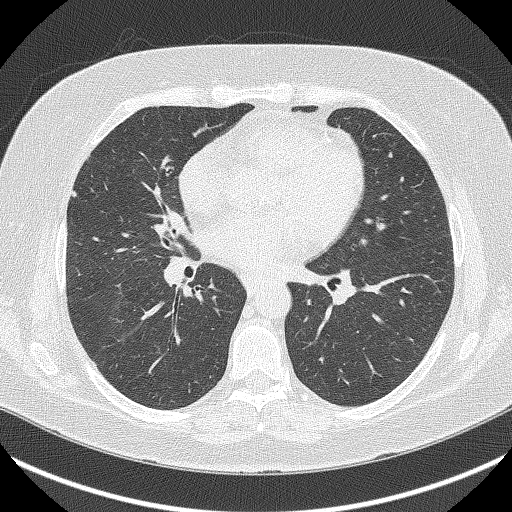
[im 206/324  lung]
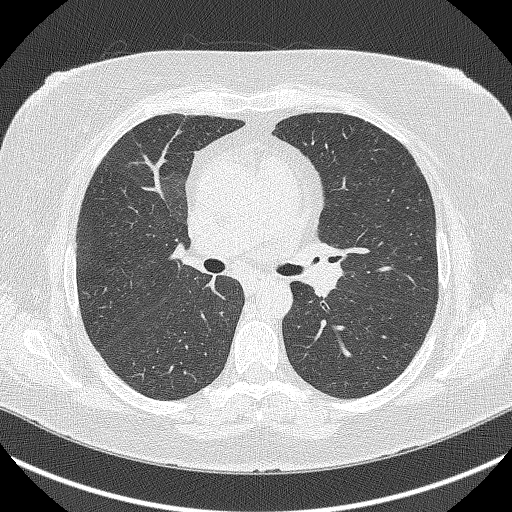
[im 221/324  mediastinal]
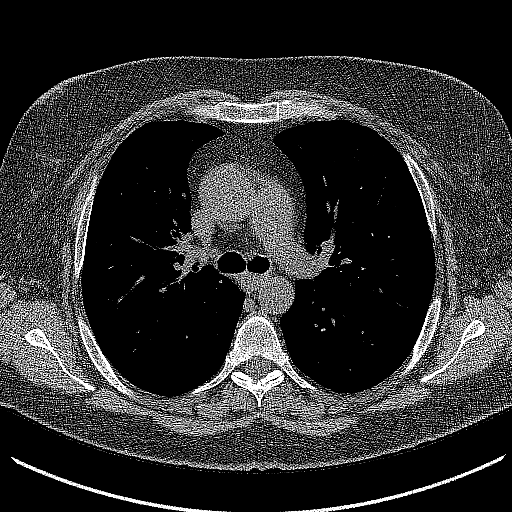
[im 221/324  lung]
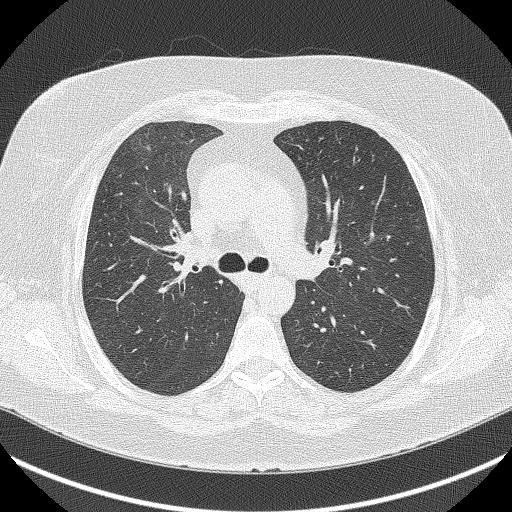
[im 250/324  lung]
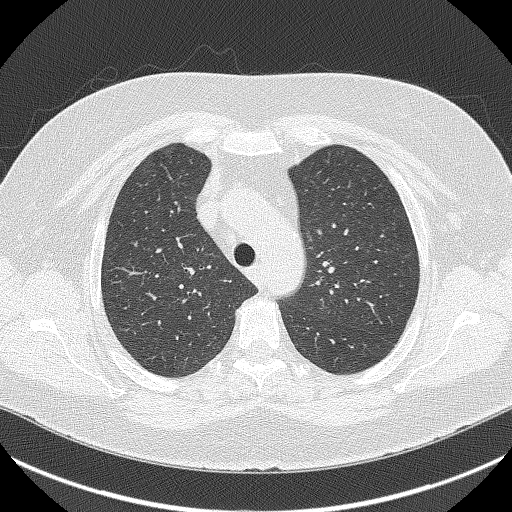
[im 279/324  lung]
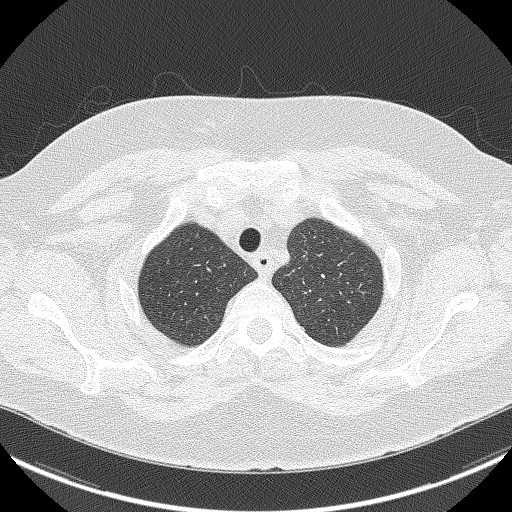
[im 309/324  lung]
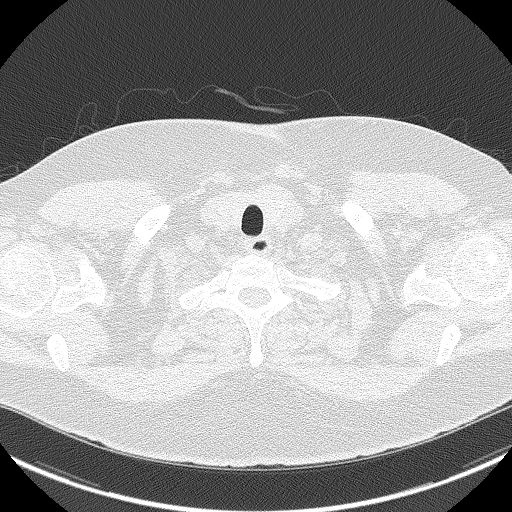

[Series 5: coronals lung 1.00 cor · coronal · 0.63mm/px · 3 of 292 slices shown]
[im 59/292  lung]
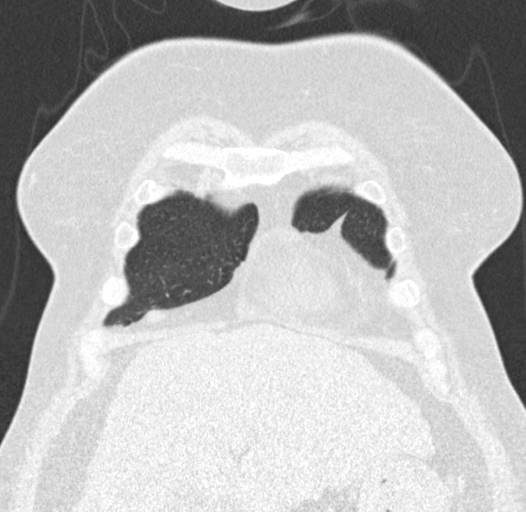
[im 117/292  lung]
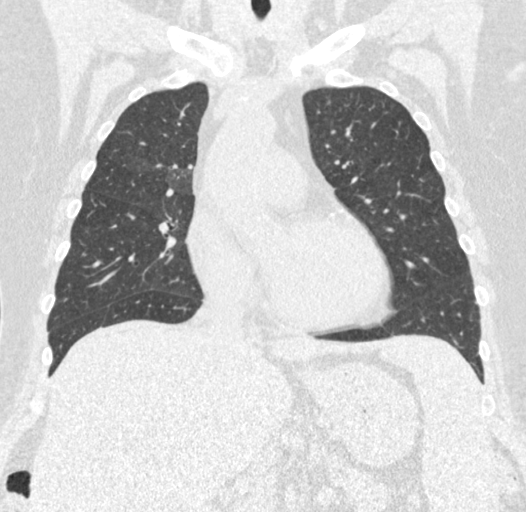
[im 175/292  lung]
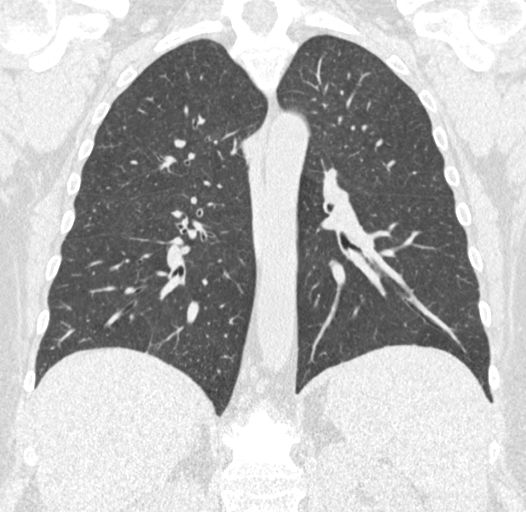

[15 of 40 positions shown; findings below may reference images not displayed]

FINDINGS: Cardiovascular: Atherosclerotic calcification of the aorta, aortic
valve and coronary arteries. Heart size normal. No pericardial
effusion.

Mediastinum/Nodes: No pathologically enlarged mediastinal or
axillary lymph nodes. Hilar regions are difficult to definitively
evaluate without IV contrast. Esophagus is grossly unremarkable.

Lungs/Pleura: Mild centrilobular emphysema. Smoking related
respiratory bronchiolitis. Calcified granuloma in the lingula.
Pulmonary nodules measure 4.5 mm or less in size. No pleural fluid.
Airway is unremarkable.

Upper Abdomen: Liver margin is markedly irregular. Associated
hypertrophy of the left hepatic lobe and caudate. Stones in the
gallbladder. Visualized portions of the adrenal glands, kidneys,
spleen, pancreas stomach and bowel are grossly unremarkable.

Musculoskeletal: Degenerative changes in the spine. No worrisome
lytic or sclerotic lesions.
IMPRESSION: 1. Lung-RADS 2, benign appearance or behavior. Continue annual
screening with low-dose chest CT without contrast in 12 months.
2. Cirrhosis.
3. Cholelithiasis.
4. Aortic atherosclerosis ([WD]-[WD]). Coronary artery
calcification.
5.  Emphysema ([WD]-[WD]).

## 2021-05-15 NOTE — Progress Notes (Signed)
Virtual Visit via Video Note  I connected with Alexandria Leblanc on 04/14/21 at  2:00 PM EST by telephone and verified that I am speaking with the correct person using two identifiers.  Location: Patient: Home Provider: Working from home   I discussed the limitations, risks, security and privacy concerns of performing an evaluation and management service by telephone and the availability of in person appointments. I also discussed with the patient that there may be a patient responsible charge related to this service. The patient expressed understanding and agreed to proceed.  Shared Decision Making Visit Lung Cancer Screening Program [phone])   Eligibility: Age 50 y.o. Pack Years Smoking History Calculation 35 (# packs/per year x # years smoked) Recent History of coughing up blood  no Unexplained weight loss? no ( >Than 15 pounds within the last 6 months ) Prior History Lung / other cancer no (Diagnosis within the last 5 years already requiring surveillance chest CT Scans). Smoking Status Current Smoker Former Smokers: Years since quit: NA  Quit Date: NA  Visit Components: Discussion included one or more decision making aids. yes Discussion included risk/benefits of screening. yes Discussion included potential follow up diagnostic testing for abnormal scans. yes Discussion included meaning and risk of over diagnosis. yes Discussion included meaning and risk of False Positives. yes Discussion included meaning of total radiation exposure. yes  Counseling Included: Importance of adherence to annual lung cancer LDCT screening. yes Impact of comorbidities on ability to participate in the program. yes Ability and willingness to under diagnostic treatment. yes  Smoking Cessation Counseling: Current Smokers:  Discussed importance of smoking cessation. yes Information about tobacco cessation classes and interventions provided to patient. yes Patient provided with "ticket" for LDCT  Scan. yes Symptomatic Patient. yes  Counseling(Intermediate counseling: > three minutes) [address]406 Diagnosis Code: Tobacco Use Z72.0 Asymptomatic Patient NA  Counseling NA Former Smokers:  Discussed the importance of maintaining cigarette abstinence. yes Diagnosis Code: Personal History of Nicotine Dependence. C16.606 Information about tobacco cessation classes and interventions provided to patient. Yes Patient provided with "ticket" for LDCT Scan. yes Written Order for Lung Cancer Screening with LDCT placed in Epic. Yes (CT Chest Lung Cancer Screening Low Dose W/O CM) [id number] Z12.2-Screening of respiratory organs Z87.891-Personal history of nicotine dependence   I spent 25 minutes of face to face time with her discussing the risks and benefits of lung cancer screening. We viewed a power point together that explained in detail the above noted topics. We took the time to pause the power point at intervals to allow for questions to be asked and answered to ensure understanding. We discussed that she had taken the single most powerful action possible to decrease her risk of developing lung cancer when she quit smoking. I counseled her to remain smoke free, and to contact me if she ever had the desire to smoke again so that I can provide resources and tools to help support the effort to remain smoke free. We discussed the time and location of the scan, and that either  Doroteo Glassman RN or I will call with the results within  24-48 hours of receiving them. She has my card and contact information in the event she needs to speak with me, in addition to a copy of the power point we reviewed as a resource. She verbalized understanding of all of the above and had no further questions upon leaving the office.     I explained to the patient that there has been a  high incidence of coronary artery disease noted on these exams. I explained that this is a non-gated exam therefore degree or severity cannot be  determined. This patient is on statin therapy. I have asked the patient to follow-up with their PCP regarding any incidental finding of coronary artery disease and management with diet or medication as they feel is clinically indicated. The patient verbalized understanding of the above and had no further questions.  Lillie Bollig D. Kenton Kingfisher, NP-C [facility] Personal contact information can be found on Amion  05/15/2021, 12:54 PM

## 2021-05-15 NOTE — Patient Instructions (Signed)
Thank you for participating in the [facility] Scre[facility]. °It was our pleasure to meet you today. °We will call you with the results of your scan within the next few days. °Your scan will be assigned a Lung RADS category score by the physicians reading the scans.  °This Lung RADS score determines follow up scanning.  °See below for description of categories, and follow up screening recommendations. °We will be in touch to schedule your follow up screening annually or based on recommendations of our providers. °We will fax a copy of your scan results to your Primary Care Physician, or the physician who referred you to the program, to ensure they have the results. °Please call the office if you have any questions or concerns regarding your scanning experience or results.  °Our office number is [phone]. °Please speak with Denise Phelps, RN. She is our Lung Cancer Screening RN. °If she is unavailable when you call, please have the office staff send her a message. She will return your call at her earliest convenience. °Remember, if your scan is normal, we will scan you annually as long as you continue to meet the criteria for the program. (Age 55-77, Current smoker or smoker who has quit within the last 15 years). °If you are a smoker, remember, quitting is the single most powerful action that you can take to decrease your risk of lung cancer and other pulmonary, breathing related problems. °We know quitting is hard, and we are here to help.  °Please let us know if there is anything we can do to help you meet your goal of quitting. °If you are a former smoker, congratulations. We are proud of you! Remain smoke free! °Remember you can refer friends or family members through the number above.  °We will screen them to make sure they meet criteria for the program. °Thank you for helping us take better care of you by participating in Lung Screening. ° °You can receive free nicotine replacement therapy  ( patches, gum or mints) by calling 1-800-QUIT NOW. Please call so we can get you on the path to becoming  a non-smoker. I know it is hard, but you can do this! ° °Lung RADS Categories: ° °Lung RADS 1: no nodules or definitely non-concerning nodules.  °Recommendation is for a repeat annual scan in 12 months. ° °Lung RADS 2:  nodules that are non-concerning in appearance and behavior with a very low likelihood of becoming an active cancer. °Recommendation is for a repeat annual scan in 12 months. ° °Lung RADS 3: nodules that are probably non-concerning , includes nodules with a low likelihood of becoming an active cancer.  Recommendation is for a 6-month repeat screening scan. Often noted after an upper respiratory illness. We will be in touch to make sure you have no questions, and to schedule your 6-month scan. ° °Lung RADS 4 A: nodules with concerning findings, recommendation is most often for a follow up scan in 3 months or additional testing based on our provider's assessment of the scan. We will be in touch to make sure you have no questions and to schedule the recommended 3 month follow up scan. ° °Lung RADS 4 B:  indicates findings that are concerning. We will be in touch with you to schedule additional diagnostic testing based on our provider's  assessment of the scan. ° °Hypnosis for smoking cessation  °[employer] °[phone] ° °Acupuncture for smoking cessation  °[facility] °[phone]  °

## 2021-05-19 ENCOUNTER — Other Ambulatory Visit (INDEPENDENT_AMBULATORY_CARE_PROVIDER_SITE_OTHER): Payer: Self-pay | Admitting: Vascular Surgery

## 2021-05-19 DIAGNOSIS — I739 Peripheral vascular disease, unspecified: Secondary | ICD-10-CM

## 2021-05-21 ENCOUNTER — Ambulatory Visit (INDEPENDENT_AMBULATORY_CARE_PROVIDER_SITE_OTHER): Payer: Managed Care, Other (non HMO) | Admitting: Nurse Practitioner

## 2021-05-21 ENCOUNTER — Ambulatory Visit (INDEPENDENT_AMBULATORY_CARE_PROVIDER_SITE_OTHER): Payer: Managed Care, Other (non HMO)

## 2021-05-21 ENCOUNTER — Encounter (INDEPENDENT_AMBULATORY_CARE_PROVIDER_SITE_OTHER): Payer: Self-pay | Admitting: Nurse Practitioner

## 2021-05-21 ENCOUNTER — Other Ambulatory Visit: Payer: Self-pay

## 2021-05-21 VITALS — BP 148/82 | HR 74 | Resp 16 | Ht 64.0 in | Wt 188.0 lb

## 2021-05-21 DIAGNOSIS — F17200 Nicotine dependence, unspecified, uncomplicated: Secondary | ICD-10-CM | POA: Diagnosis not present

## 2021-05-21 DIAGNOSIS — G8929 Other chronic pain: Secondary | ICD-10-CM

## 2021-05-21 DIAGNOSIS — M5442 Lumbago with sciatica, left side: Secondary | ICD-10-CM

## 2021-05-21 DIAGNOSIS — I10 Essential (primary) hypertension: Secondary | ICD-10-CM | POA: Diagnosis not present

## 2021-05-21 DIAGNOSIS — I739 Peripheral vascular disease, unspecified: Secondary | ICD-10-CM

## 2021-05-21 DIAGNOSIS — M5441 Lumbago with sciatica, right side: Secondary | ICD-10-CM

## 2021-05-23 ENCOUNTER — Encounter (INDEPENDENT_AMBULATORY_CARE_PROVIDER_SITE_OTHER): Payer: Self-pay | Admitting: Nurse Practitioner

## 2021-05-23 NOTE — Progress Notes (Signed)
Subjective:    Patient ID: Alexandria Leblanc, female    DOB: 1971-03-06, 50 y.o.   MRN: [id number] Chief Complaint  Patient presents with   New Patient (Initial Visit)    Ref Derrill Kay claudication,concern for PVD    Alexandria Leblanc is a 50 year old female that presents today for evaluation of lower extremity leg pain.  The patient notes that she has pain in her lower back that radiates to her hip and her lower back when she stands up.  She notes that the left hurts when she walks for any extended time frames.  She also notes that her back hurts when she is moving in bed she also have cramps until she recently changed her statin medication.  She does have known sciatica.  She also notes that her back pain does help with squatting and sitting.  She is recently evaluated for neurosurgery and noted to have moderates spinal stenosis.  They have plans to perform follow-ups studies.  There are no wounds or ulceration and there are no classic rest pain like symptoms.  Today noninvasive studies show an ABI of 1.18 on the right and 1.16 on the left.  A TBI 0.79 on the right and 0.93 on the left.  The patient has triphasic tibial artery waveforms bilaterally with good toe waveforms bilaterally.   Review of Systems  Musculoskeletal:  Positive for back pain and gait problem.  All other systems reviewed and are negative.     Objective:   Physical Exam Vitals reviewed.  HENT:     Head: Normocephalic.  Cardiovascular:     Rate and Rhythm: Normal rate.     Pulses: Normal pulses.  Pulmonary:     Effort: Pulmonary effort is normal.  Skin:    General: Skin is warm and dry.  Neurological:     Mental Status: She is alert and oriented to person, place, and time.  Psychiatric:        Mood and Affect: Mood normal.        Behavior: Behavior normal.        Thought Content: Thought content normal.        Judgment: Judgment normal.    BP (!) 148/82 (BP Location: Left Arm)    Pulse 74    Resp 16    Ht 5' 4"   (1.626 m)    Wt 188 lb (85.3 kg)    LMP  (LMP Unknown)    BMI 32.27 kg/m   Past Medical History:  Diagnosis Date   Asthma    Genital warts    GERD (gastroesophageal reflux disease)    Hypertension    Migraines    Type 2 diabetes mellitus (HCC)     Social History   Socioeconomic History   Marital status: Single    Spouse name: Not on file   Number of children: Not on file   Years of education: Not on file   Highest education level: Not on file  Occupational History   Not on file  Tobacco Use   Smoking status: Every Day    Packs/day: 1.00    Years: 35.00    Pack years: 35.00    Types: Cigarettes   Smokeless tobacco: Never  Substance and Sexual Activity   Alcohol use: Yes    Alcohol/week: 0.0 standard drinks    Comment: occ   Drug use: No   Sexual activity: Not on file  Other Topics Concern   Not on file  Social History Narrative  Single; No children.; 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 1and half a day- smoking; 5 beers a week; no hard liqor.           Social Determinants of Health   Financial Resource Strain: Not on file  Food Insecurity: Not on file  Transportation Needs: Not on file  Physical Activity: Not on file  Stress: Not on file  Social Connections: Not on file  Intimate Partner Violence: Not on file    History reviewed. No pertinent surgical history.  Family History  Problem Relation Age of Onset   Heart disease Mother    Hypertension Mother    Dementia Mother    COPD Father    Hypertension Father    Hyperlipidemia Brother    Prostate cancer Paternal Uncle    Alzheimer's disease Sister     Allergies  Allergen Reactions   Penicillins Hives and Itching   Sulfa Antibiotics Hives    CBC Latest Ref Rng & Units 11/11/2020 07/30/2020 01/03/2020  WBC 4.0 - 10.5 K/uL 6.0 5.7 7.4  Hemoglobin 12.0 - 15.0 g/dL 13.8 13.5 13.8  Hematocrit 36.0 - 46.0 % 41.1 39.2 40.4  Platelets 150.0 - 400.0  K/uL 112.0(L) 88.0(L) 112(L)      CMP     Component Value Date/Time   NA 135 11/11/2020 0852   K 4.2 11/11/2020 0852   CL 100 11/11/2020 0852   CO2 25 11/11/2020 0852   GLUCOSE 145 (H) 11/11/2020 0852   BUN 8 11/11/2020 0852   CREATININE 0.56 11/11/2020 0852   CALCIUM 9.3 11/11/2020 0852   PROT 7.8 11/11/2020 0852   ALBUMIN 4.1 11/11/2020 0852   AST 25 11/11/2020 0852   ALT 24 11/11/2020 0852   ALKPHOS 111 11/11/2020 0852   BILITOT 0.3 11/11/2020 0852   GFRNONAA >60 01/03/2020 0940   GFRAA >60 01/03/2020 0940     No results found.     Assessment & Plan:   1. Claudication [facility]) Patient's noninvasive studies today did not show any obvious PAD.  Her ABIs were normal however based on her description of pain in order to be certain there is no iliac level disease we will have the patient return for an aortoiliac duplex.  Otherwise the patient will continue to follow with neurosurgery for work-up and evaluation of her continued pain.  2. Essential hypertension Continue antihypertensive medications as already ordered, these medications have been reviewed and there are no changes at this time.   3. Tobacco dependence Smoking cessation was discussed, 3-10 minutes spent on this topic specifically   4. Chronic bilateral low back pain with bilateral sciatica This may be related to the patient's lower extremity back pain.  She will continue to follow with neurosurgery.   Current Outpatient Medications on File Prior to Visit  Medication Sig Dispense Refill   acyclovir (ZOVIRAX) 400 MG tablet Take 1 tablet (400 mg total) by mouth 2 (two) times daily. For herpes prevention. 180 tablet 3   albuterol (VENTOLIN HFA) 108 (90 Base) MCG/ACT inhaler INHALE 2 PUFFS INTO LUNGS EVERY 6 HOURS AS NEEDED FOR WHEEZING OR SHORTNESS OF BREATH 6.7 g 0   Blood Glucose Monitoring Suppl (FREESTYLE LITE) w/Device KIT 1 each by Does not apply route as directed. DX E11.9 1 kit 0   fluticasone (FLONASE) 50  MCG/ACT nasal spray Place 1 spray into both nostrils 2 (two) times daily. 16 g 3   fluticasone furoate-vilanterol (BREO ELLIPTA) 100-25 MCG/ACT  AEPB INHALE 1 PUFF INTO THE LUNGS DAILY 90 each 2   glipiZIDE (GLIPIZIDE XL) 10 MG 24 hr tablet Take 1 tablet (10 mg total) by mouth daily with breakfast. For diabetes. 90 tablet 3   glucose blood test strip Use to test blood sugars up to three times a day Dx E11.9 200 each 3   hydrochlorothiazide (HYDRODIURIL) 25 MG tablet Take 1 tablet (25 mg total) by mouth daily. For blood pressure. 90 tablet 3   Lancets (FREESTYLE) lancets 1 each 3 (three) times daily.     metFORMIN (GLUCOPHAGE-XR) 750 MG 24 hr tablet Take 1 tablet (750 mg total) by mouth 2 (two) times daily with a meal. For diabetes. 180 tablet 1   olmesartan (BENICAR) 20 MG tablet TAKE 1 TABLET BY MOUTH ONCE DAILY FOR BLOOD PRESSURE 90 tablet 2   omeprazole (PRILOSEC) 40 MG capsule Take 1 capsule (40 mg total) by mouth in the morning and at bedtime. For heartburn 60 capsule 0   pravastatin (PRAVACHOL) 40 MG tablet Take 1 tablet (40 mg total) by mouth daily. For cholesterol 90 tablet 2   No current facility-administered medications on file prior to visit.    There are no Patient Instructions on file for this visit. No follow-ups on file.   Kris Hartmann, NP

## 2021-05-26 ENCOUNTER — Ambulatory Visit: Payer: Managed Care, Other (non HMO) | Admitting: Primary Care

## 2021-05-26 ENCOUNTER — Ambulatory Visit: Payer: Managed Care, Other (non HMO)

## 2021-05-26 ENCOUNTER — Other Ambulatory Visit: Payer: Self-pay | Admitting: Neurosurgery

## 2021-05-26 DIAGNOSIS — D334 Benign neoplasm of spinal cord: Secondary | ICD-10-CM

## 2021-06-03 ENCOUNTER — Ambulatory Visit: Payer: Managed Care, Other (non HMO) | Admitting: Primary Care

## 2021-06-11 ENCOUNTER — Ambulatory Visit
Admission: RE | Admit: 2021-06-11 | Discharge: 2021-06-11 | Disposition: A | Discharge status: Home / Self Care | Payer: Managed Care, Other (non HMO) | Source: Ambulatory Visit | Attending: Neurosurgery | Admitting: Neurosurgery

## 2021-06-11 DIAGNOSIS — D334 Benign neoplasm of spinal cord: Secondary | ICD-10-CM

## 2021-06-11 IMAGING — MR MR LUMBAR SPINE WO/W CM
5 of 7 series · 33 of 48 positions shown · IV contrast (multihance)
Comparison: [DATE].  Chest CT [DATE]

CLINICAL DATA: Lumbar region back pain and left-sided radiating
pain. Symptoms over the last 18 months.

EXAM:
MRI LUMBAR SPINE WITHOUT AND WITH CONTRAST
TECHNIQUE: Multiplanar and multiecho pulse sequences of the lumbar spine were
obtained without and with intravenous contrast.
CONTRAST:  17mL MULTIHANCE GADOBENATE DIMEGLUMINE 529 MG/ML IV SOLN

[Series 2: T1 · sagittal · 4.0mm · 0.41mm/px · 3 of 13 slices shown (1 of 2)]
[im 1/13]
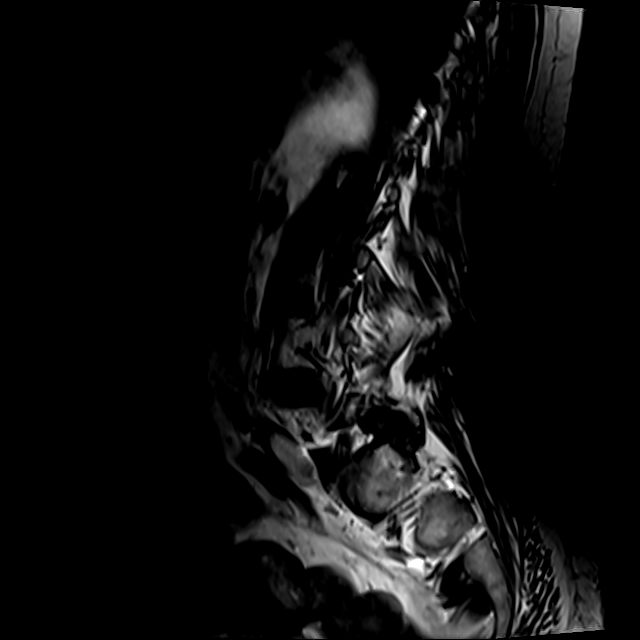
[im 7/13]
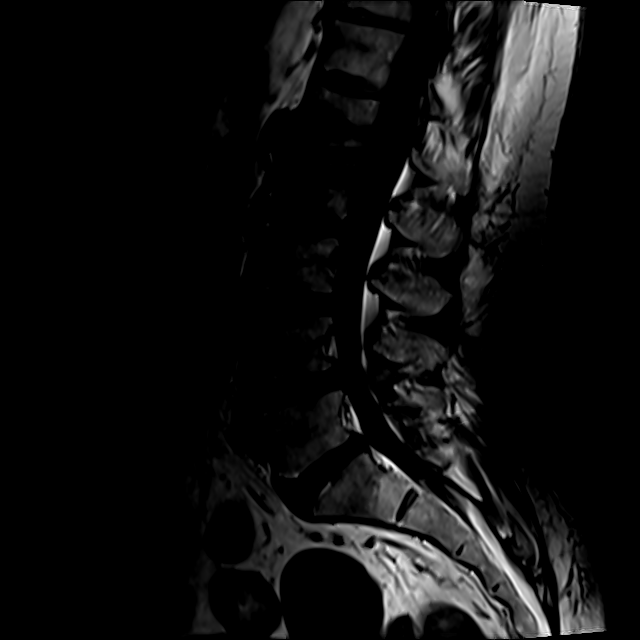
[im 13/13]
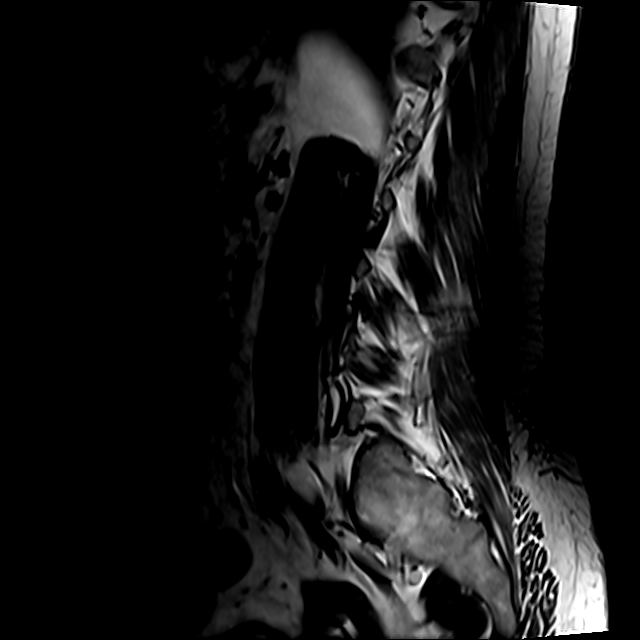

[Series 4: T2 · sagittal · 4.0mm · 1.02mm/px · 4 of 13 slices shown (1 of 2)]
[im 1/13]
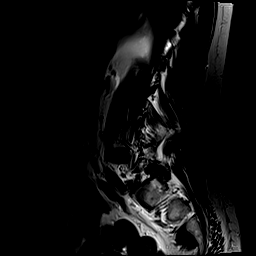
[im 5/13]
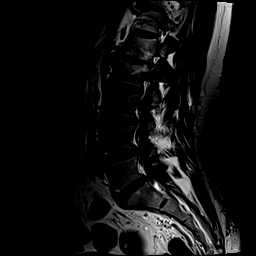
[im 9/13]
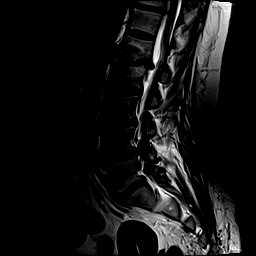
[im 13/13]
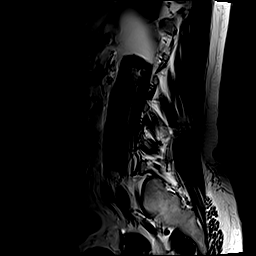

[Series 5: T1 · axial · 4.0mm · 0.78mm/px · z∈[-79,+132]mm · 11 of 39 slices shown (2 of 2)]
[im 1/39]
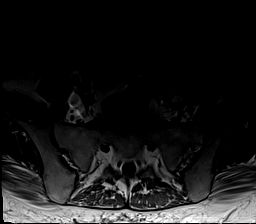
[im 4/39]
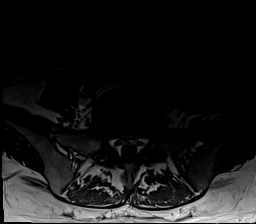
[im 8/39]
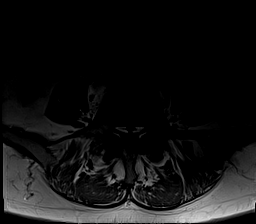
[im 12/39]
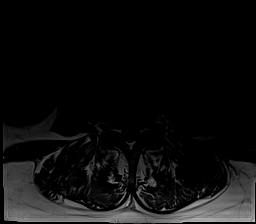
[im 16/39]
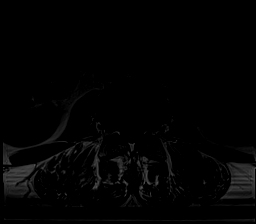
[im 20/39]
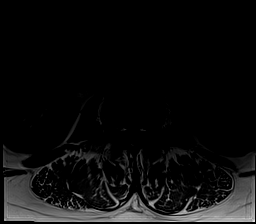
[im 23/39]
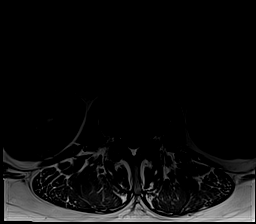
[im 27/39]
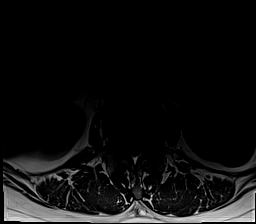
[im 31/39]
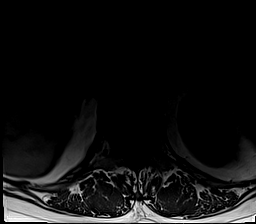
[im 35/39]
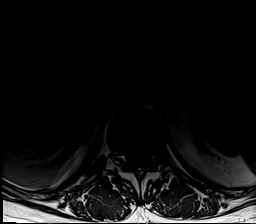
[im 39/39]
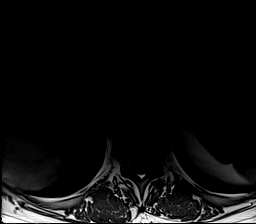

[Series 6: T2 · axial · 4.0mm · 0.78mm/px · z∈[-81,+133]mm · 11 of 39 slices shown (2 of 2)]
[im 1/39]
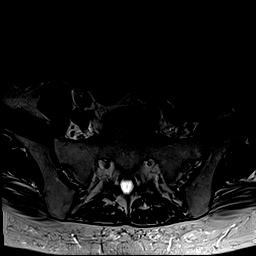
[im 4/39]
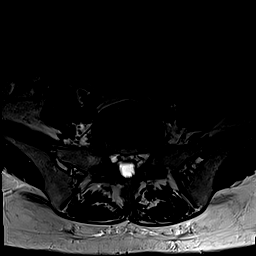
[im 8/39]
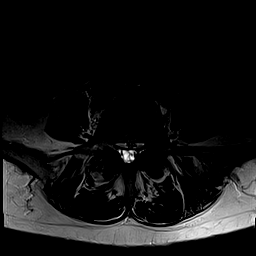
[im 12/39]
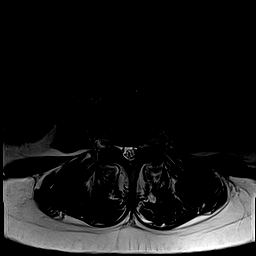
[im 16/39]
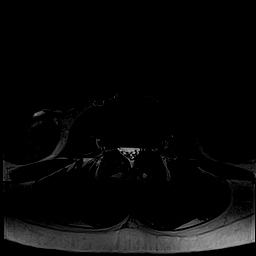
[im 20/39]
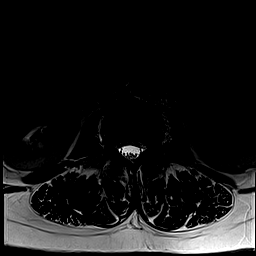
[im 23/39]
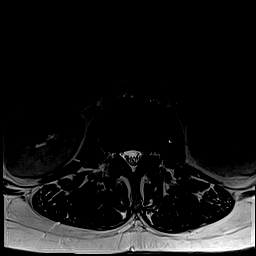
[im 27/39]
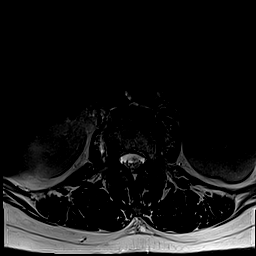
[im 31/39]
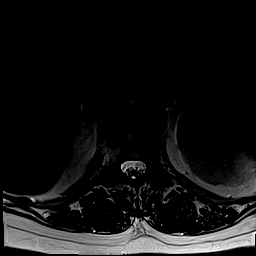
[im 35/39]
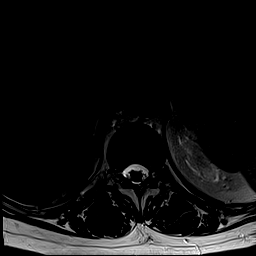
[im 39/39]
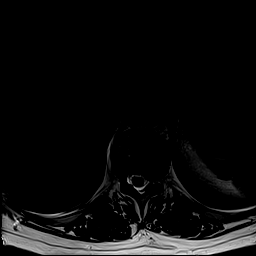

[Series 7: T1 fat-sat post-contrast · sagittal · 4.0mm · 0.41mm/px · 4 of 13 slices shown]
[im 1/13]
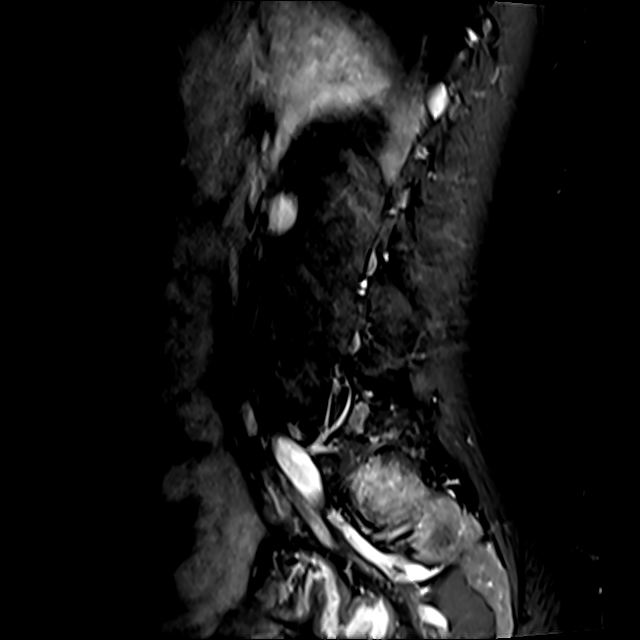
[im 5/13]
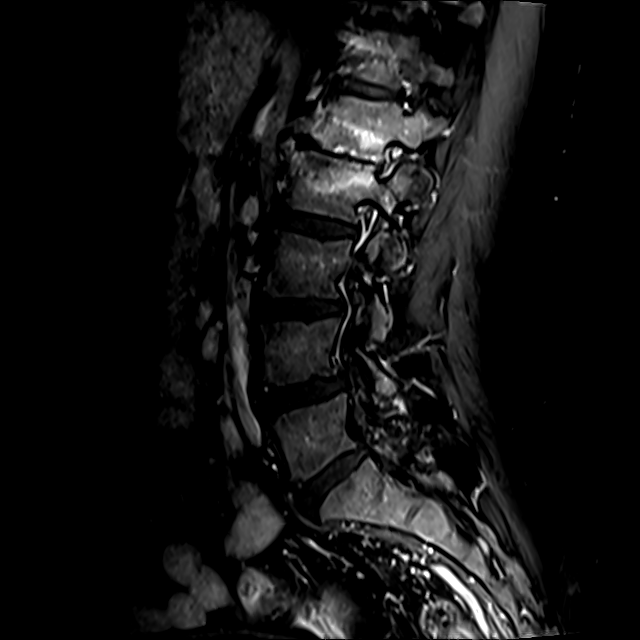
[im 9/13]
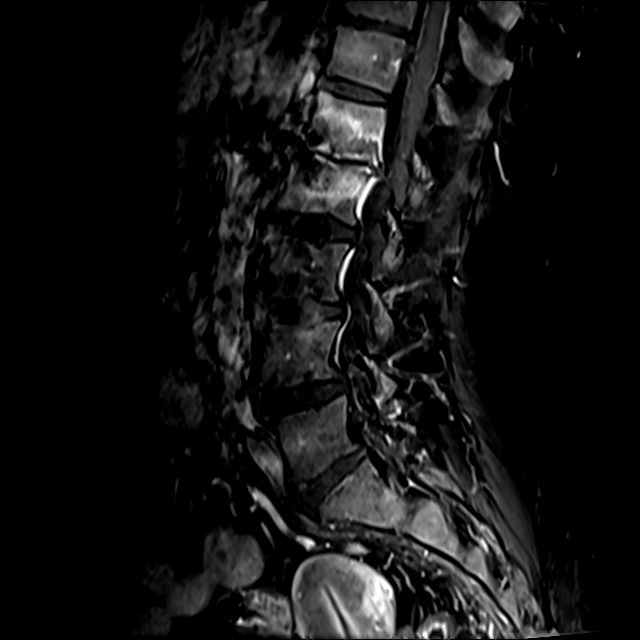
[im 13/13]
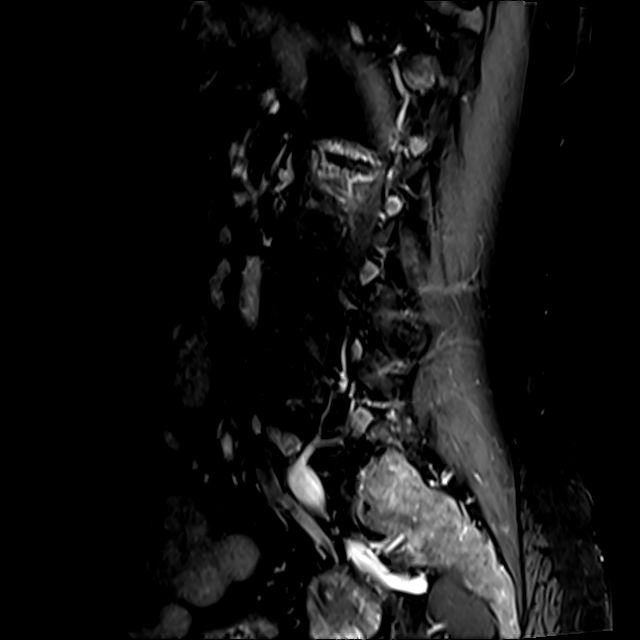

[33 of 48 positions shown; findings below may reference images not displayed]

FINDINGS: Segmentation:  5 lumbar type vertebral bodies.

Alignment:  1 mm degenerative anterolisthesis L4-5.

Vertebrae: Worsening of discogenic marrow changes at the L1-2 level,
likely associated with regional back pain.

Conus medullaris and cauda equina: Conus extends to the L1-2 level.
Conus and cauda equina appear normal.

Paraspinal and other soft tissues: No change in a 22 mm entity on
the right running underneath the twelfth rib. I favor that this
represents a venous varix more likely than a schwannoma. Neither
should be of clinical significance. No sign that this is enlarging.

Disc levels:

T12-L1: Normal

L1-2: Disc degeneration with loss of disc height. Endplate
osteophytes and bulging of the disc. Indentation of the thecal sac
but no visible neural compression. As noted above, there is worsened
discogenic marrow change which could be associated with regional
back pain.

L2-3: Minimal disc bulge.  No stenosis.

L3-4: Mild disc bulge.  No stenosis.

L4-5: Bilateral facet degeneration and hypertrophy with 1 mm of
anterolisthesis. Bulging of the disc. Stenosis of the subarticular
lateral recesses that could cause neural compression on either or
both sides.

L5-S1: Mild bulging of the disc. Bilateral facet osteoarthritis. No
compressive stenosis. The facet arthritis could be painful.
IMPRESSION: Chronic disc degeneration at L1-2. More extensive pattern of
discogenic edema and enhancement within the L1 and L2 vertebral
bodies, which could be associated with regional back pain. Endplate
osteophytes and bulging of the disc that indent the thecal sac,
slightly crowding the nerve roots of the cauda equina, but without
visible neural compression.

L4-5: Bilateral facet degeneration and hypertrophy with 1 mm of
anterolisthesis. Bulging of the disc. Stenosis of both subarticular
lateral recesses that could cause neural compression on either or
both sides. No change since the previous study.

L5-S1 facet osteoarthritis. No compressive stenosis. The facet
arthritis could be a cause of back pain or referred facet syndrome
pain.

22 mm entity underneath the right posterior twelfth rib. I favor
that this represents a venous varix rather than a schwannoma, but
neither should be of clinical relevance. There is no change since
the study of 8 months ago.

## 2021-06-11 MED ORDER — GADOBENATE DIMEGLUMINE 529 MG/ML IV SOLN
17.0000 mL | Freq: Once | INTRAVENOUS | Status: AC | PRN
  Administered 2021-06-11: 17 mL via INTRAVENOUS

## 2021-06-16 ENCOUNTER — Ambulatory Visit (INDEPENDENT_AMBULATORY_CARE_PROVIDER_SITE_OTHER): Payer: Managed Care, Other (non HMO) | Admitting: Nurse Practitioner

## 2021-06-16 ENCOUNTER — Encounter (INDEPENDENT_AMBULATORY_CARE_PROVIDER_SITE_OTHER): Payer: Managed Care, Other (non HMO)

## 2021-06-29 ENCOUNTER — Other Ambulatory Visit: Payer: Self-pay | Admitting: *Deleted

## 2021-06-29 DIAGNOSIS — F17210 Nicotine dependence, cigarettes, uncomplicated: Secondary | ICD-10-CM

## 2021-07-06 ENCOUNTER — Telehealth: Payer: Managed Care, Other (non HMO) | Admitting: Nurse Practitioner

## 2021-07-06 ENCOUNTER — Encounter: Payer: Self-pay | Admitting: Nurse Practitioner

## 2021-07-06 ENCOUNTER — Other Ambulatory Visit: Payer: Self-pay

## 2021-07-06 VITALS — BP 143/79 | HR 75

## 2021-07-06 DIAGNOSIS — J019 Acute sinusitis, unspecified: Secondary | ICD-10-CM | POA: Insufficient documentation

## 2021-07-06 HISTORY — DX: Acute sinusitis, unspecified: J01.90

## 2021-07-06 MED ORDER — DOXYCYCLINE HYCLATE 100 MG PO TABS: 100.0000 mg | ORAL_TABLET | Freq: Two times a day (BID) | ORAL | 0 refills | Status: AC

## 2021-07-06 NOTE — Assessment & Plan Note (Signed)
Given patient's comorbidities and length of symptoms will like to treat with doxycycline 100 mg 1 tablet twice daily for 7 days.  Did discuss signs and symptoms when to be seen urgently or emergently and when to return to clinic.  Patient will call if she does not improve after several days of antibiotic use.  States she has not been using her albuterol inhaler that much just a couple times that she has been sick.  Told her to reach out to clinic if this increases

## 2021-07-06 NOTE — Progress Notes (Signed)
Patient ID: Alexandria Leblanc, female    DOB: [birth year], 51 y.o.   MRN: [id number]  Virtual visit completed through [employer], a video enabled telemedicine application. Due to national recommendations of social distancing due to COVID-19, a virtual visit is felt to be most appropriate for this patient at this time. Reviewed limitations, risks, security and privacy concerns of performing a virtual visit and the availability of in person appointments. I also reviewed that there may be a patient responsible charge related to this service. The patient agreed to proceed.   Patient location: home Provider location: [place] at [facility], office Persons participating in this virtual visit: patient, provider   If any vitals were documented, they were collected by patient at home unless specified below.    BP (!) 143/79 Comment: per patient today   Pulse 75    LMP  (LMP Unknown)    CC: Sinus Problem Subjective:   HPI: Alexandria Leblanc is a 51 y.o. female presenting on 07/06/2021 for Sinus Problem (X 1 month, on 07/02/21 felt worse, brother tested positive for Covid recently. Patient tested negative via [facility] on 07/05/21. Sinus pressure, head pressure, sore throat, runny nose, sneezing, coughing, congestion. No Fever. Has taking Mucinex sinus max)  Symptoms started approx 6 days ago. Covid test on 07/05/2021 was negative [facility] Pfizer vaccines x2 Brother has tested positive for covid on 06/29/2021 Mucinex sinus max OTC helped some   Relevant past medical, surgical, family and social history reviewed and updated as indicated. Interim medical history since our last visit reviewed. Allergies and medications reviewed and updated. Outpatient Medications Prior to Visit  Medication Sig Dispense Refill   acyclovir (ZOVIRAX) 400 MG tablet Take 1 tablet (400 mg total) by mouth 2 (two) times daily. For herpes prevention. 180 tablet 3   albuterol (VENTOLIN HFA) 108 (90 Base) MCG/ACT inhaler INHALE 2  PUFFS INTO LUNGS EVERY 6 HOURS AS NEEDED FOR WHEEZING OR SHORTNESS OF BREATH 6.7 g 0   Blood Glucose Monitoring Suppl (FREESTYLE LITE) w/Device KIT 1 each by Does not apply route as directed. DX E11.9 1 kit 0   fluticasone furoate-vilanterol (BREO ELLIPTA) 100-25 MCG/ACT AEPB INHALE 1 PUFF INTO THE LUNGS DAILY 90 each 2   glipiZIDE (GLIPIZIDE XL) 10 MG 24 hr tablet Take 1 tablet (10 mg total) by mouth daily with breakfast. For diabetes. 90 tablet 3   glucose blood test strip Use to test blood sugars up to three times a day Dx E11.9 200 each 3   hydrochlorothiazide (HYDRODIURIL) 25 MG tablet Take 1 tablet (25 mg total) by mouth daily. For blood pressure. 90 tablet 3   Lancets (FREESTYLE) lancets 1 each 3 (three) times daily.     metFORMIN (GLUCOPHAGE-XR) 750 MG 24 hr tablet Take 1 tablet (750 mg total) by mouth 2 (two) times daily with a meal. For diabetes. (Patient taking differently: Take 750 mg by mouth daily with breakfast. For diabetes.) 180 tablet 1   olmesartan (BENICAR) 20 MG tablet TAKE 1 TABLET BY MOUTH ONCE DAILY FOR BLOOD PRESSURE 90 tablet 2   omeprazole (PRILOSEC) 40 MG capsule Take 1 capsule (40 mg total) by mouth in the morning and at bedtime. For heartburn 60 capsule 0   pravastatin (PRAVACHOL) 40 MG tablet Take 1 tablet (40 mg total) by mouth daily. For cholesterol 90 tablet 2   metFORMIN (GLUCOPHAGE) 1000 MG tablet Take 1,000 mg by mouth at bedtime.     fluticasone (FLONASE) 50 MCG/ACT nasal spray Place  1 spray into both nostrils 2 (two) times daily. 16 g 3   No facility-administered medications prior to visit.     Per HPI unless specifically indicated in ROS section below Review of Systems  Constitutional:  Positive for appetite change, chills and fatigue. Negative for fever.  HENT:  Positive for congestion, ear pain (fullness on right side), sinus pressure, sinus pain and sore throat. Negative for ear discharge.   Respiratory:  Positive for cough (greenish yellowish).  Negative for shortness of breath.   Cardiovascular:  Negative for chest pain.  Gastrointestinal:  Negative for abdominal pain, diarrhea, nausea and vomiting.  Musculoskeletal:  Negative for arthralgias and myalgias.  Neurological:  Positive for headaches.  Objective:  BP (!) 143/79 Comment: per patient today   Pulse 75    LMP  (LMP Unknown)   Wt Readings from Last 3 Encounters:  05/21/21 188 lb (85.3 kg)  05/15/21 189 lb (85.7 kg)  03/11/21 186 lb (84.4 kg)       Physical exam: Gen: alert, NAD, not ill appearing Pulm: speaks in complete sentences without increased work of breathing Psych: normal mood, normal thought content      Results for orders placed or performed in visit on 02/23/21  Uric acid  Result Value Ref Range   Uric Acid, Serum 5.5 2.4 - 7.0 mg/dL   Assessment & Plan:   Problem List Items Addressed This Visit       Respiratory   Acute non-recurrent sinusitis - Primary    Given patient's comorbidities and length of symptoms will like to treat with doxycycline 100 mg 1 tablet twice daily for 7 days.  Did discuss signs and symptoms when to be seen urgently or emergently and when to return to clinic.  Patient will call if she does not improve after several days of antibiotic use.  States she has not been using her albuterol inhaler that much just a couple times that she has been sick.  Told her to reach out to clinic if this increases      Relevant Medications   doxycycline (VIBRA-TABS) 100 MG tablet     Meds ordered this encounter  Medications   doxycycline (VIBRA-TABS) 100 MG tablet    Sig: Take 1 tablet (100 mg total) by mouth 2 (two) times daily for 7 days.    Dispense:  14 tablet    Refill:  0    Order Specific Question:   Supervising Provider    Answer:   TOWER, MARNE A [[id number]]   No orders of the defined types were placed in this encounter.   I discussed the assessment and treatment plan with the patient. The patient was provided an opportunity to ask  questions and all were answered. The patient agreed with the plan and demonstrated an understanding of the instructions. The patient was advised to call back or seek an in-person evaluation if the symptoms worsen or if the condition fails to improve as anticipated.  Follow up plan: No follow-ups on file.  Romilda Garret, NP

## 2021-07-07 ENCOUNTER — Encounter: Payer: Self-pay | Admitting: Nurse Practitioner

## 2021-07-13 ENCOUNTER — Encounter: Payer: Self-pay | Admitting: Nurse Practitioner

## 2021-07-14 MED ORDER — DOXYCYCLINE HYCLATE 100 MG PO TABS: 100.0000 mg | ORAL_TABLET | Freq: Two times a day (BID) | ORAL | 0 refills | Status: AC

## 2021-07-14 NOTE — Telephone Encounter (Signed)
If this is for her illness I was treating her for that is fine

## 2021-07-21 ENCOUNTER — Other Ambulatory Visit: Payer: Self-pay | Admitting: Primary Care

## 2021-07-21 DIAGNOSIS — E119 Type 2 diabetes mellitus without complications: Secondary | ICD-10-CM

## 2021-07-22 NOTE — Telephone Encounter (Signed)
Patient needs office visit with me very soon for diabetes follow up. Please schedule ASAP. Anytime, any slot, any day.

## 2021-07-30 NOTE — Telephone Encounter (Signed)
Tried to call pt to schedule a office visit for diabetes follow-up but pt did not answer.  ?

## 2021-08-05 ENCOUNTER — Other Ambulatory Visit: Payer: Self-pay | Admitting: Primary Care

## 2021-08-05 DIAGNOSIS — E119 Type 2 diabetes mellitus without complications: Secondary | ICD-10-CM

## 2021-08-05 NOTE — Telephone Encounter (Signed)
Patient is overdue for diabetes follow-up, needs to be scheduled ASAP. ?Let me know once she has been scheduled. ?

## 2021-08-10 NOTE — Telephone Encounter (Signed)
No answer no voice mail  

## 2021-08-12 NOTE — Telephone Encounter (Signed)
Called home and cell to call for appointment ?

## 2021-08-12 NOTE — Telephone Encounter (Signed)
Patient called back states she just got back from work from medical leave. Will have to see when she can get off and call back.  ?

## 2021-08-18 ENCOUNTER — Other Ambulatory Visit: Payer: Self-pay | Admitting: Primary Care

## 2021-08-18 DIAGNOSIS — A6000 Herpesviral infection of urogenital system, unspecified: Secondary | ICD-10-CM

## 2021-08-20 ENCOUNTER — Other Ambulatory Visit: Payer: Self-pay | Admitting: Primary Care

## 2021-08-20 DIAGNOSIS — E119 Type 2 diabetes mellitus without complications: Secondary | ICD-10-CM

## 2021-08-24 ENCOUNTER — Other Ambulatory Visit: Payer: Self-pay | Admitting: Primary Care

## 2021-08-24 DIAGNOSIS — J4540 Moderate persistent asthma, uncomplicated: Secondary | ICD-10-CM

## 2021-09-08 ENCOUNTER — Ambulatory Visit (INDEPENDENT_AMBULATORY_CARE_PROVIDER_SITE_OTHER): Payer: Managed Care, Other (non HMO) | Admitting: Primary Care

## 2021-09-08 ENCOUNTER — Encounter: Payer: Self-pay | Admitting: Primary Care

## 2021-09-08 ENCOUNTER — Other Ambulatory Visit: Payer: Self-pay | Admitting: Primary Care

## 2021-09-08 VITALS — BP 160/88 | HR 74 | Ht 64.0 in | Wt 190.0 lb

## 2021-09-08 DIAGNOSIS — A6000 Herpesviral infection of urogenital system, unspecified: Secondary | ICD-10-CM

## 2021-09-08 DIAGNOSIS — E119 Type 2 diabetes mellitus without complications: Secondary | ICD-10-CM

## 2021-09-08 DIAGNOSIS — J4540 Moderate persistent asthma, uncomplicated: Secondary | ICD-10-CM

## 2021-09-08 DIAGNOSIS — M5442 Lumbago with sciatica, left side: Secondary | ICD-10-CM

## 2021-09-08 DIAGNOSIS — E1165 Type 2 diabetes mellitus with hyperglycemia: Secondary | ICD-10-CM | POA: Diagnosis not present

## 2021-09-08 DIAGNOSIS — I10 Essential (primary) hypertension: Secondary | ICD-10-CM

## 2021-09-08 DIAGNOSIS — M5441 Lumbago with sciatica, right side: Secondary | ICD-10-CM

## 2021-09-08 DIAGNOSIS — G8929 Other chronic pain: Secondary | ICD-10-CM

## 2021-09-08 LAB — POCT GLYCOSYLATED HEMOGLOBIN (HGB A1C): Hemoglobin A1C: 9.6 % — AB (ref 4.0–5.6)

## 2021-09-08 LAB — COMPREHENSIVE METABOLIC PANEL
ALT: 33 U/L (ref 0–35)
AST: 36 U/L (ref 0–37)
Albumin: 4 g/dL (ref 3.5–5.2)
Alkaline Phosphatase: 141 U/L — ABNORMAL HIGH (ref 39–117)
BUN: 8 mg/dL (ref 6–23)
CO2: 27 mEq/L (ref 19–32)
Calcium: 9.6 mg/dL (ref 8.4–10.5)
Chloride: 99 mEq/L (ref 96–112)
Creatinine, Ser: 0.48 mg/dL (ref 0.40–1.20)
GFR: 109.89 mL/min (ref >60.00)
Glucose, Bld: 199 mg/dL — ABNORMAL HIGH (ref 70–99)
Potassium: 4.7 mEq/L (ref 3.5–5.1)
Sodium: 134 mEq/L — ABNORMAL LOW (ref 135–145)
Total Bilirubin: 0.3 mg/dL (ref 0.2–1.2)
Total Protein: 7.6 g/dL (ref 6.0–8.3)

## 2021-09-08 LAB — LIPID PANEL
Cholesterol: 196 mg/dL (ref 0–200)
HDL: 39.9 mg/dL (ref >39.00)
NonHDL: 155.81
Total CHOL/HDL Ratio: 5
Triglycerides: 314 mg/dL — ABNORMAL HIGH (ref 0.0–149.0)
VLDL: 62.8 mg/dL — ABNORMAL HIGH (ref 0.0–40.0)

## 2021-09-08 LAB — LDL CHOLESTEROL, DIRECT: Direct LDL: 100 mg/dL

## 2021-09-08 MED ORDER — OZEMPIC (0.25 OR 0.5 MG/DOSE) 2 MG/1.5ML ~~LOC~~ SOPN: PEN_INJECTOR | SUBCUTANEOUS | 1 refills | Status: DC

## 2021-09-08 MED ORDER — MONTELUKAST SODIUM 10 MG PO TABS: 10.0000 mg | ORAL_TABLET | Freq: Every day | ORAL | 0 refills | Status: DC

## 2021-09-08 MED ORDER — OLMESARTAN MEDOXOMIL 40 MG PO TABS: 40.0000 mg | ORAL_TABLET | Freq: Every day | ORAL | 1 refills | Status: DC

## 2021-09-08 NOTE — Assessment & Plan Note (Signed)
Uncontrolled today, even on recheck and during prior office visit.  Increase olmesartan to 40 mg daily. Continue HCTZ 25 mg daily.  I advised for her to start monitoring her blood pressure at home, report if readings remain at or above 130/90.  CMP pending.

## 2021-09-08 NOTE — Progress Notes (Signed)
? ?Subjective:  ? ? Patient ID: Alexandria Leblanc, female    DOB: 05-05-1971, 51 y.o.   MRN: [id number] ? ?HPI ? ?Alexandria Leblanc is a very pleasant 51 y.o. female with a history of hypertension, asthma, type 2 diabetes, chronic back pain, frequent headaches who presents today for follow-up of chronic conditions. ? ?1) Hypertension: Currently managed on hydrochlorothiazide 25 mg daily, olmesartan 20 mg daily. ? ?Today she endorses a lot of stress this morning. She does have a headache this morning, otherwise headaches are no worse than usual.  ? ?BP Readings from Last 3 Encounters:  ?09/08/21 (!) 154/86  ?07/06/21 (!) 143/79  ?05/21/21 (!) 148/82  ? ?2) Type 2 Diabetes: ? ?Current medications include: Glipizide XL 10 mg daily, metformin 1000 mg BID. She has been consistently taking her Glipizide and metformin.  ? ?She continues to notice GI upset with metformin XR, especially after eating.  ? ?She is checking her blood glucose infrequently.  ? ?Last A1C: 6.7 in June, 9.6 today.  ?Last Eye Exam: Due ?Last Foot Exam: Due ?Pneumonia Vaccination: 2017 ?Urine Microalbumin: None. Managed on ARB ?Statin: Pravastatin  ? ?Dietary changes since last visit: She endorses a poor diet.  ? ? ?Exercise: She is active at work.  ? ?3) Asthma: Currently managed on Breo Ellipta 100-25 mcg daily, albuterol inhaler as needed. She has been compliant to her Memory Dance until one week ago when the cost increased to over $200 per month.  ? ?Overall her asthma is controlled on Breo, but she has noticed some shortness of breath in the morning. She uses her albuterol inhaler twice weekly on average. She's never tried Singulair.  ? 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 She's had no outbreaks since she's been taking continuously. She would like to continue.   ? ?5) Chronic Back Pain/Caregiver Stress: She has recently been on medical leave from work for her chronic  back, lower extremity pain, and pain to her feet. She was on leave from early February 2023 and returned work March 15th, 2023.  ? 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 She is under a lot of stress with her responsibilities. She will develop tearfulness, anxiety symptoms.  ? ?Since she's returned to work she's continued to experience lower back, lower extremity, and bilateral foot pain. She has been following with neurosurgery, had to cancel her most recent surgery. She is taking Tylenol Arthritis.  ? ? ?Review of Systems  ?Respiratory:  Positive for shortness of breath.   ?Cardiovascular:  Negative for chest pain.  ?Genitourinary:   ?     Genital herpes   ?Neurological:  Positive for numbness.  ? ?   ? ? ?Past Medical History:  ?Diagnosis Date  ? Asthma   ? Genital warts   ? GERD (gastroesophageal reflux disease)   ? Hypertension   ? Migraines   ? Type 2 diabetes mellitus ([place])   ? ? ?Social History  ? ?Socioeconomic History  ? Marital status: Single  ?  Spouse name: Not on file  ? Number of children: Not on file  ? Years of education: Not on file  ? Highest education level: Not on file  ?Occupational History  ? Not on file  ?Tobacco Use  ? Smoking status: Every Day  ?  Packs/day: 1.00  ?  Years: 35.00  ?  Pack years: 35.00  ?  Types: Cigarettes  ? Smokeless tobacco: Never  ?Substance and Sexual Activity  ? Alcohol use: Yes  ?  Alcohol/week: 0.0 standard drinks  ?  Comment: occ  ? Drug use: No  ? Sexual activity: Not on file  ?Other Topics Concern  ? Not on file  ?Social History Narrative  ? 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 1and half a day- smoking; 5 beers a week; no hard liqor.   ?   ?    ? ?Social Determinants of Health  ? ?Financial Resource Strain: Not on file  ?Food Insecurity: Not on file  ?Transportation Needs: Not on file  ?Physical Activity: Not on file   ?Stress: Not on file  ?Social Connections: Not on file  ?Intimate Partner Violence: Not on file  ? ? ?No past surgical history on file. ? ?Family History  ?Problem Relation Age of Onset  ? Heart disease Mother   ? Hypertension Mother   ? Dementia Mother   ? COPD Father   ? Hypertension Father   ? Hyperlipidemia Brother   ? Prostate cancer Paternal Uncle   ? Alzheimer's disease Sister   ? ? ?Allergies  ?Allergen Reactions  ? Penicillins Hives and Itching  ? Sulfa Antibiotics Hives  ? ? ?Current Outpatient Medications on File Prior to Visit  ?Medication Sig Dispense Refill  ? acyclovir (ZOVIRAX) 400 MG tablet Take 1 tablet (400 mg total) by mouth 2 (two) times daily. For herpes prevention. Office visit required for further refills. 180 tablet 0  ? albuterol (VENTOLIN HFA) 108 (90 Base) MCG/ACT inhaler INHALE 2 PUFFS INTO LUNGS EVERY 6 HOURS AS NEEDED FOR WHEEZING OR SHORTNESS OF BREATH 6.7 g 0  ? Blood Glucose Monitoring Suppl (FREESTYLE LITE) w/Device KIT 1 each by Does not apply route as directed. DX E11.9 1 kit 0  ? fluticasone furoate-vilanterol (BREO ELLIPTA) 100-25 MCG/ACT AEPB INHALE 1 PUFF INTO THE LUNGS DAILY 90 each 2  ? glipiZIDE (GLUCOTROL XL) 10 MG 24 hr tablet TAKE 1 TABLET BY MOUTH ONCE DAILY WITH BREAKFAST FOR  DIABETES 30 tablet 0  ? glucose blood test strip Use to test blood sugars up to three times a day Dx E11.9 200 each 3  ? hydrochlorothiazide (HYDRODIURIL) 25 MG tablet Take 1 tablet (25 mg total) by mouth daily. For blood pressure. 90 tablet 3  ? Lancets (FREESTYLE) lancets 1 each 3 (three) times daily.    ? metFORMIN (GLUCOPHAGE) 1000 MG tablet Take 1,000 mg by mouth at bedtime.    ? metFORMIN (GLUCOPHAGE-XR) 750 MG 24 hr tablet Take 1 tablet (750 mg total) by mouth 2 (two) times daily with a meal. For diabetes. (Patient taking differently: Take 750 mg by mouth daily with breakfast. For diabetes.) 180 tablet 1  ? olmesartan (BENICAR) 20 MG tablet TAKE 1 TABLET BY MOUTH ONCE DAILY FOR BLOOD  PRESSURE 90 tablet 2  ? omeprazole (PRILOSEC) 40 MG capsule Take 1 capsule (40 mg total) by mouth in the morning and at bedtime. For heartburn 60 capsule 0  ? pravastatin (PRAVACHOL) 40 MG tablet Take 1 tablet (40 mg total) by mouth daily. For cholesterol 90 tablet 2  ? ?No current facility-administered medications on file prior to visit.  ? ? ?BP (!) 154/86   Pulse 74   Ht 5' 4"  (1.626 m)   Wt 190 lb (86.2 kg)   LMP  (LMP Unknown)  SpO2 97%   BMI 32.61 kg/m?  ?Objective:  ? Physical Exam ?Cardiovascular:  ?   Rate and Rhythm: Normal rate and regular rhythm.  ?Pulmonary:  ?   Effort: Pulmonary effort is normal.  ?   Breath sounds: Normal breath sounds.  ?Musculoskeletal:  ?   Cervical back: Neck supple.  ?Skin: ?   General: Skin is warm and dry.  ? ? ? ? ? ?   ?Assessment & Plan:  ? ? ? ? ?This visit occurred during the SARS-CoV-2 public health emergency.  Safety protocols were in place, including screening questions prior to the visit, additional usage of staff PPE, and extensive cleaning of exam room while observing appropriate contact time as indicated for disinfecting solutions.  ?

## 2021-09-08 NOTE — Assessment & Plan Note (Signed)
Stable. ? ?Continue acyclovir 400 mg twice daily. ?

## 2021-09-08 NOTE — Assessment & Plan Note (Signed)
Overall controlled except that Memory Dance is now cost prohibitive.  ?She will call her insurance to check on what is covered.  ? ?For now we will start Singulair 10 mg HS. ? ?Continue albuterol PRN. ? ?She will update.  ? ? ? ?

## 2021-09-08 NOTE — Assessment & Plan Note (Signed)
Ongoing, worse now that she has returned to work. ? ?She will follow-up with the neurosurgeon. ? ?I offered to treat with gabapentin, she currently declines. ?

## 2021-09-08 NOTE — Patient Instructions (Addendum)
Stop by the lab prior to leaving today. I will notify you of your results once received.  ? ?Start semaglutide (Ozempic) for diabetes.  Inject 0.25 mg weekly x4 weeks, then increase to 0.5 mg weekly thereafter. ? ?Please call your insurance regarding an inhaler they will cover.  Let me know you find out. ? ?Start montelukast (Singulair) 10 mg.  Take 1 tablet by mouth at bedtime for allergies and asthma. ? ?We increase the dose of your olmesartan to 40 mg daily.  I sent a new prescription to your pharmacy. ? ?Start checking your blood pressure at home, notify me if you see beings that are consistently at or above 130 on top and/or 90 on bottom. ? ?Start checking her blood sugars daily as discussed.  Notify me if you continue to see readings at or above 150 consistently. ? ?Please schedule a follow up visit for 3 months. ? ?It was a pleasure to see you today! ? ?

## 2021-09-08 NOTE — Assessment & Plan Note (Signed)
Uncontrolled with A1c of 9.6 today. ? ?We discussed the need for her to improve her diet. ?Continue metformin 1000 mg twice daily, glipizide XL 10 mg daily. ? ?Add Ozempic 0.25 mg milligrams weekly x4 weeks, then increase to 0.5 mg weekly thereafter. ? ?I strongly advise she start checking glucose levels and to report if readings consistently at or above 150 ? ?We will plan to see her back in 3 months for follow-up. ?

## 2021-09-09 ENCOUNTER — Ambulatory Visit: Payer: Managed Care, Other (non HMO) | Admitting: Primary Care

## 2021-09-14 ENCOUNTER — Other Ambulatory Visit: Payer: Self-pay | Admitting: Primary Care

## 2021-09-14 DIAGNOSIS — K219 Gastro-esophageal reflux disease without esophagitis: Secondary | ICD-10-CM

## 2021-09-14 DIAGNOSIS — E119 Type 2 diabetes mellitus without complications: Secondary | ICD-10-CM

## 2021-09-14 DIAGNOSIS — E1165 Type 2 diabetes mellitus with hyperglycemia: Secondary | ICD-10-CM

## 2021-09-29 ENCOUNTER — Ambulatory Visit: Payer: Managed Care, Other (non HMO) | Admitting: Family

## 2021-09-29 ENCOUNTER — Encounter: Payer: Self-pay | Admitting: Family

## 2021-09-29 VITALS — BP 118/64 | HR 74 | Temp 98.7°F | Resp 16 | Ht 64.0 in | Wt 181.6 lb

## 2021-09-29 DIAGNOSIS — R110 Nausea: Secondary | ICD-10-CM

## 2021-09-29 DIAGNOSIS — R1031 Right lower quadrant pain: Secondary | ICD-10-CM

## 2021-09-29 DIAGNOSIS — R350 Frequency of micturition: Secondary | ICD-10-CM

## 2021-09-29 DIAGNOSIS — R1013 Epigastric pain: Secondary | ICD-10-CM

## 2021-09-29 DIAGNOSIS — R1011 Right upper quadrant pain: Secondary | ICD-10-CM | POA: Diagnosis not present

## 2021-09-29 DIAGNOSIS — H6691 Otitis media, unspecified, right ear: Secondary | ICD-10-CM

## 2021-09-29 DIAGNOSIS — Z20822 Contact with and (suspected) exposure to covid-19: Secondary | ICD-10-CM

## 2021-09-29 HISTORY — DX: Right lower quadrant pain: R10.31

## 2021-09-29 LAB — LIPASE: Lipase: 63 U/L — ABNORMAL HIGH (ref 11.0–59.0)

## 2021-09-29 LAB — POC URINALSYSI DIPSTICK (AUTOMATED)
Bilirubin, UA: NEGATIVE
Blood, UA: NEGATIVE
Glucose, UA: NEGATIVE
Ketones, UA: NEGATIVE
Leukocytes, UA: NEGATIVE
Nitrite, UA: NEGATIVE
Protein, UA: NEGATIVE
Spec Grav, UA: 1.015 (ref 1.010–1.025)
Urobilinogen, UA: 0.2 E.U./dL
pH, UA: 5.5 (ref 5.0–8.0)

## 2021-09-29 LAB — COMPREHENSIVE METABOLIC PANEL
ALT: 24 U/L (ref 0–35)
AST: 30 U/L (ref 0–37)
Albumin: 4.3 g/dL (ref 3.5–5.2)
Alkaline Phosphatase: 121 U/L — ABNORMAL HIGH (ref 39–117)
BUN: 15 mg/dL (ref 6–23)
CO2: 23 mEq/L (ref 19–32)
Calcium: 9.7 mg/dL (ref 8.4–10.5)
Chloride: 93 mEq/L — ABNORMAL LOW (ref 96–112)
Creatinine, Ser: 0.92 mg/dL (ref 0.40–1.20)
GFR: 72.25 mL/min (ref >60.00)
Glucose, Bld: 133 mg/dL — ABNORMAL HIGH (ref 70–99)
Potassium: 4.2 mEq/L (ref 3.5–5.1)
Sodium: 127 mEq/L — ABNORMAL LOW (ref 135–145)
Total Bilirubin: 0.4 mg/dL (ref 0.2–1.2)
Total Protein: 8.2 g/dL (ref 6.0–8.3)

## 2021-09-29 LAB — CBC WITH DIFFERENTIAL/PLATELET
Basophils Absolute: 0 10*3/uL (ref 0.0–0.1)
Basophils Relative: 0.5 % (ref 0.0–3.0)
Eosinophils Absolute: 0.2 10*3/uL (ref 0.0–0.7)
Eosinophils Relative: 3.2 % (ref 0.0–5.0)
HCT: 41.7 % (ref 36.0–46.0)
Hemoglobin: 14 g/dL (ref 12.0–15.0)
Lymphocytes Relative: 30.7 % (ref 12.0–46.0)
Lymphs Abs: 2.3 10*3/uL (ref 0.7–4.0)
MCHC: 33.7 g/dL (ref 30.0–36.0)
MCV: 90.3 fl (ref 78.0–100.0)
Monocytes Absolute: 0.6 10*3/uL (ref 0.1–1.0)
Monocytes Relative: 8.3 % (ref 3.0–12.0)
Neutro Abs: 4.3 10*3/uL (ref 1.4–7.7)
Neutrophils Relative %: 57.3 % (ref 43.0–77.0)
Platelets: 127 10*3/uL — ABNORMAL LOW (ref 150.0–400.0)
RBC: 4.61 Mil/uL (ref 3.87–5.11)
RDW: 13.3 % (ref 11.5–15.5)
WBC: 7.5 10*3/uL (ref 4.0–10.5)

## 2021-09-29 LAB — AMYLASE: Amylase: 38 U/L (ref 27–131)

## 2021-09-29 LAB — POC COVID19 BINAXNOW: SARS Coronavirus 2 Ag: NEGATIVE

## 2021-09-29 MED ORDER — ONDANSETRON HCL 4 MG PO TABS: 4.0000 mg | ORAL_TABLET | Freq: Three times a day (TID) | ORAL | 0 refills | Status: DC | PRN

## 2021-09-29 MED ORDER — PANTOPRAZOLE SODIUM 40 MG PO TBEC
40.0000 mg | DELAYED_RELEASE_TABLET | Freq: Every day | ORAL | 0 refills | Status: DC
Start: 2021-09-29 — End: 2021-10-20

## 2021-09-29 NOTE — Assessment & Plan Note (Signed)
Stat Ct abd pelvis to r/o appendicitis vs liver enlargement vs cholecystitis ?

## 2021-09-29 NOTE — Progress Notes (Signed)
? ?Established Patient Office Visit ? ?Subjective:  ?Patient ID: Alexandria Leblanc, female    DOB: [birth year]  Age: 51 y.o. MRN: [id number] ? ?CC:  ?Chief Complaint  ?Patient presents with  ? Nausea  ?  X 3 days hurting upper abdominal pain  ? Fatigue  ? Headache  ? Abdominal Pain  ?  X 3 days  ? ? ?HPI ?Alexandria Leblanc is here today with concerns.  ? ?Four days ago started with some abdominal pain, epigastric mainly with nausea.  ?Headache, fatigue, zero energy.  ?Was unable to covid test at home didn't have test available.  ?No sore throat, cough but always with cough as chronic smoker. No chest congestion. No ear pain. No sinus pressure. Not sneezing. No nasal congestion. No fever no chills.  ? ?No diarrhea or constipation out of the ordinary. No blood in stool.  ?Did have some heart burn yesterday, today a little bit better. Took some tums with mild relief but not much. Tried pepto bismal and ginger ale also no help.  ? ?Has noted a slight increase in urinary frequency and states usually less urine than usual. No blood in urine.  ? ?Past Medical History:  ?Diagnosis Date  ? Acute non-recurrent sinusitis 07/06/2021  ? Asthma   ? Genital warts   ? GERD (gastroesophageal reflux disease)   ? Hypertension   ? Migraines   ? Type 2 diabetes mellitus ([place])   ? ? ?No past surgical history on file. ? ?Family History  ?Problem Relation Age of Onset  ? Heart disease Mother   ? Hypertension Mother   ? Dementia Mother   ? COPD Father   ? Hypertension Father   ? Hyperlipidemia Brother   ? Prostate cancer Paternal Uncle   ? Alzheimer's disease Sister   ? ? ?Social History  ? ?Socioeconomic History  ? Marital status: Single  ?  Spouse name: Not on file  ? Number of children: Not on file  ? Years of education: Not on file  ? Highest education level: Not on file  ?Occupational History  ? Not on file  ?Tobacco Use  ? Smoking status: Every Day  ?  Packs/day: 1.00  ?  Years: 35.00  ?  Pack years: 35.00  ?  Types: Cigarettes  ? Smokeless  tobacco: Never  ?Substance and Sexual Activity  ? Alcohol use: Yes  ?  Alcohol/week: 0.0 standard drinks  ?  Comment: occ  ? Drug use: No  ? Sexual activity: Not on file  ?Other Topics Concern  ? Not on file  ?Social History Narrative  ? 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 1and half a day- smoking; 5 beers a week; no hard liqor.   ?   ?    ? ?Social Determinants of Health  ? ?Financial Resource Strain: Not on file  ?Food Insecurity: Not on file  ?Transportation Needs: Not on file  ?Physical Activity: Not on file  ?Stress: Not on file  ?Social Connections: Not on file  ?Intimate Partner Violence: Not on file  ? ? ?Outpatient Medications Prior to Visit  ?Medication Sig Dispense Refill  ? acyclovir (ZOVIRAX) 400 MG tablet Take 1 tablet (400 mg total) by mouth 2 (two) times daily. For herpes prevention. Office visit required for further refills. 180 tablet 0  ? albuterol (VENTOLIN HFA) 108 (90 Base) MCG/ACT inhaler INHALE 2 PUFFS INTO LUNGS EVERY 6 HOURS AS  NEEDED FOR WHEEZING OR SHORTNESS OF BREATH 6.7 g 0  ? Blood Glucose Monitoring Suppl (FREESTYLE LITE) w/Device KIT 1 each by Does not apply route as directed. DX E11.9 1 kit 0  ? fluticasone furoate-vilanterol (BREO ELLIPTA) 100-25 MCG/ACT AEPB INHALE 1 PUFF INTO THE LUNGS DAILY 90 each 2  ? glipiZIDE (GLUCOTROL XL) 10 MG 24 hr tablet Take 1 tablet (10 mg total) by mouth daily with breakfast. for diabetes. 90 tablet 1  ? glucose blood test strip Use to test blood sugars up to three times a day Dx E11.9 200 each 3  ? hydrochlorothiazide (HYDRODIURIL) 25 MG tablet Take 1 tablet (25 mg total) by mouth daily. For blood pressure. 90 tablet 3  ? Lancets (FREESTYLE) lancets 1 each 3 (three) times daily.    ? metFORMIN (GLUCOPHAGE) 1000 MG tablet TAKE 1 TABLET BY MOUTH TWICE A DAY FOR DIABETES 180 tablet 1  ? montelukast (SINGULAIR) 10 MG tablet Take 1 tablet (10 mg total) by mouth at bedtime. For  allergies and asthma 90 tablet 0  ? olmesartan (BENICAR) 40 MG tablet Take 1 tablet (40 mg total) by mouth daily. for blood pressure. 90 tablet 1  ? pravastatin (PRAVACHOL) 40 MG tablet Take 1 tablet (40 mg total) by mouth daily. For cholesterol 90 tablet 2  ? Semaglutide,0.25 or 0.5MG /DOS, (OZEMPIC, 0.25 OR 0.5 MG/DOSE,) 2 MG/1.5ML SOPN Inject 0.25 mg into the skin once weekly for 4 weeks, increase to 0.5 mg weekly thereafter for diabetes. 6 mL 1  ? omeprazole (PRILOSEC) 20 MG capsule TAKE 1 CAPSULE BY MOUTH ONCE DAILY FOR HEARTBURN. 90 capsule 1  ? ?No facility-administered medications prior to visit.  ? ? ?Allergies  ?Allergen Reactions  ? Penicillins Hives and Itching  ? Sulfa Antibiotics Hives  ? ? ?ROS ?Review of Systems  ?Constitutional:  Positive for fatigue. Negative for chills and fever.  ?HENT:  Negative for congestion, ear pain, sinus pressure and sore throat.   ?Respiratory:  Negative for cough, shortness of breath and wheezing.   ?Cardiovascular:  Negative for chest pain and palpitations.  ?Gastrointestinal:  Positive for abdominal pain (epigastric) and nausea. Negative for abdominal distention, blood in stool, constipation, diarrhea and vomiting.  ?Genitourinary:  Positive for decreased urine volume and frequency. Negative for difficulty urinating, pelvic pain, urgency, vaginal bleeding, vaginal discharge and vaginal pain.  ?Neurological:  Positive for headaches. Negative for dizziness.  ? ?  ?Objective:  ?  ?Physical Exam ?Constitutional:   ?   General: She is not in acute distress. ?   Appearance: Normal appearance. She is obese. She is not ill-appearing, toxic-appearing or diaphoretic.  ?HENT:  ?   Head: Normocephalic.  ?   Right Ear: Tympanic membrane is erythematous and bulging.  ?   Left Ear: Tympanic membrane normal.  ?   Nose: Nose normal.  ?   Mouth/Throat:  ?   Mouth: Mucous membranes are moist.  ?Eyes:  ?   Pupils: Pupils are equal, round, and reactive to light.  ?Cardiovascular:  ?   Rate  and Rhythm: Normal rate and regular rhythm.  ?Pulmonary:  ?   Effort: Pulmonary effort is normal.  ?Abdominal:  ?   General: Abdomen is flat. Bowel sounds are normal. There is distension.  ?   Tenderness: There is abdominal tenderness (ruq with fullness, right lower quadrant with pressure, epigastric with moderate tenderness) in the right upper quadrant, right lower quadrant and epigastric area. There is no rebound. Positive signs include Murphy's sign and  McBurney's sign. Negative signs include psoas sign and obturator sign.  ?   Comments: Fullness palpated with tenderness right to mid upper quadrant abdomen  ?Musculoskeletal:  ?   Cervical back: Normal range of motion.  ?Neurological:  ?   General: No focal deficit present.  ?   Mental Status: She is alert and oriented to person, place, and time.  ?Psychiatric:     ?   Mood and Affect: Mood normal.     ?   Behavior: Behavior normal.     ?   Thought Content: Thought content normal.     ?   Judgment: Judgment normal.  ? ? ?BP 118/64   Pulse 74   Temp 98.7 ?F (37.1 ?C)   Resp 16   Ht $R'5\' 4"'NM$  (1.626 m)   Wt 181 lb 9 oz (82.4 kg)   LMP  (LMP Unknown)   SpO2 98%   BMI 31.17 kg/m?  ?Wt Readings from Last 3 Encounters:  ?09/29/21 181 lb 9 oz (82.4 kg)  ?09/08/21 190 lb (86.2 kg)  ?05/21/21 188 lb (85.3 kg)  ? ? ? ?Health Maintenance Due  ?Topic Date Due  ? COLONOSCOPY (Pts 45-65yrs Insurance coverage will need to be confirmed)  Never done  ? FOOT EXAM  03/14/2019  ? OPHTHALMOLOGY EXAM  03/25/2019  ? MAMMOGRAM  Never done  ? Zoster Vaccines- Shingrix (2 of 2) 03/27/2021  ? ? ?There are no preventive care reminders to display for this patient. ? ?Lab Results  ?Component Value Date  ? TSH 1.64 03/22/2017  ? ?Lab Results  ?Component Value Date  ? WBC 6.0 11/11/2020  ? HGB 13.8 11/11/2020  ? HCT 41.1 11/11/2020  ? MCV 91.7 11/11/2020  ? PLT 112.0 (L) 11/11/2020  ? ?Lab Results  ?Component Value Date  ? NA 134 (L) 09/08/2021  ? K 4.7 09/08/2021  ? CO2 27 09/08/2021  ?  GLUCOSE 199 (H) 09/08/2021  ? BUN 8 09/08/2021  ? CREATININE 0.48 09/08/2021  ? BILITOT 0.3 09/08/2021  ? ALKPHOS 141 (H) 09/08/2021  ? AST 36 09/08/2021  ? ALT 33 09/08/2021  ? PROT 7.6 09/08/2021  ? ALBUMIN 4

## 2021-09-29 NOTE — Patient Instructions (Signed)
Stop omeprazole start pantoprazole  ?take this medication for two weeks, administer 30 minutes prior to breakfast each am. Try to decrease and or avoid spicy foods, fried fatty foods, and also caffeine and chocolate as these can increase heartburn symptoms.  ? ?Stop by the lab prior to leaving today. I will notify you of your results once received.  ? ?Due to recent changes in healthcare laws, you may see results of your imaging and/or laboratory studies on MyChart before I have had a chance to review them.  I understand that in some cases there may be results that are confusing or concerning to you. Please understand that not all results are received at the same time and often I may need to interpret multiple results in order to provide you with the best plan of care or course of treatment. Therefore, I ask that you please give me 2 business days to thoroughly review all your results before contacting my office for clarification. Should we see a critical lab result, you will be contacted sooner.  ? ?It was a pleasure seeing you today! Please do not hesitate to reach out with any questions and or concerns. ? ?Regards,  ? ?Gizella Belleville ?FNP-C ? ?

## 2021-09-29 NOTE — Assessment & Plan Note (Signed)
Negative psoas negative obturator however deep moderate pain on palpation ?Stat CT to r/o appendicitis  ? ?

## 2021-09-29 NOTE — Assessment & Plan Note (Signed)
Stop omeprazole start pantoprazole 40 mg  ?Try to decrease and or avoid spicy foods, fried fatty foods, and also caffeine and chocolate as these can increase heartburn symptoms.  ? ?

## 2021-09-29 NOTE — Assessment & Plan Note (Signed)
Rapid covid test today pending results  ?

## 2021-09-29 NOTE — Assessment & Plan Note (Signed)
rx zofran 4 mg prn  ?Eat as tolerated, bland diet ?

## 2021-09-29 NOTE — Assessment & Plan Note (Signed)
Suspected right ear infection from PE however pending antbx treatment because of abdominal pain, workup for labs and CT pending. Will consider sending in RX once hopefully all tests negative for more urgent findings ?

## 2021-09-30 ENCOUNTER — Encounter: Payer: Self-pay | Admitting: Family

## 2021-09-30 ENCOUNTER — Ambulatory Visit
Admission: RE | Admit: 2021-09-30 | Discharge: 2021-09-30 | Disposition: A | Discharge status: Home / Self Care | Payer: Managed Care, Other (non HMO) | Source: Ambulatory Visit | Attending: Family | Admitting: Family

## 2021-09-30 ENCOUNTER — Other Ambulatory Visit: Payer: Self-pay | Admitting: Family

## 2021-09-30 DIAGNOSIS — D696 Thrombocytopenia, unspecified: Secondary | ICD-10-CM

## 2021-09-30 DIAGNOSIS — R161 Splenomegaly, not elsewhere classified: Secondary | ICD-10-CM

## 2021-09-30 DIAGNOSIS — K7030 Alcoholic cirrhosis of liver without ascites: Secondary | ICD-10-CM

## 2021-09-30 DIAGNOSIS — R1011 Right upper quadrant pain: Secondary | ICD-10-CM | POA: Diagnosis present

## 2021-09-30 DIAGNOSIS — I8500 Esophageal varices without bleeding: Secondary | ICD-10-CM | POA: Insufficient documentation

## 2021-09-30 DIAGNOSIS — R1013 Epigastric pain: Secondary | ICD-10-CM | POA: Insufficient documentation

## 2021-09-30 DIAGNOSIS — R1031 Right lower quadrant pain: Secondary | ICD-10-CM | POA: Insufficient documentation

## 2021-09-30 DIAGNOSIS — K8020 Calculus of gallbladder without cholecystitis without obstruction: Secondary | ICD-10-CM | POA: Insufficient documentation

## 2021-09-30 LAB — URINE CULTURE
MICRO NUMBER:: 13340035
Result:: NO GROWTH
SPECIMEN QUALITY:: ADEQUATE

## 2021-09-30 IMAGING — CT CT ABD-PELV W/ CM
2 of 5 series · 15 of 46 positions shown, 17 images · IV contrast (APPLIED)
Comparison: Multiple exams, including chest CT [DATE]

CLINICAL DATA: Right lower quadrant abdominal pain and epigastric
pain.

EXAM:
CT ABDOMEN AND PELVIS WITH CONTRAST
TECHNIQUE: Multidetector CT imaging of the abdomen and pelvis was performed
using the standard protocol following bolus administration of
intravenous contrast.

[Series 2: routine abd/pel with · axial · 0.89mm/px · z∈[-827,-427]mm · 12 of 90 slices shown, 14 images]
[im 5/90  soft-tissue]
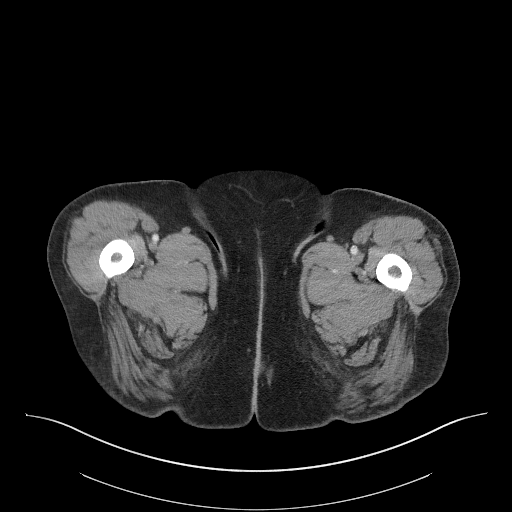
[im 5/90  bone]
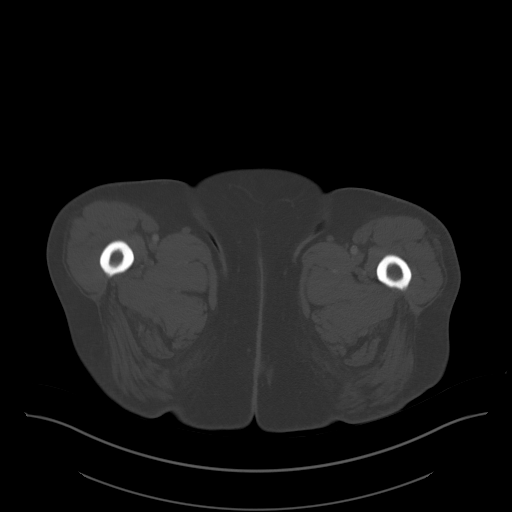
[im 15/90  soft-tissue]
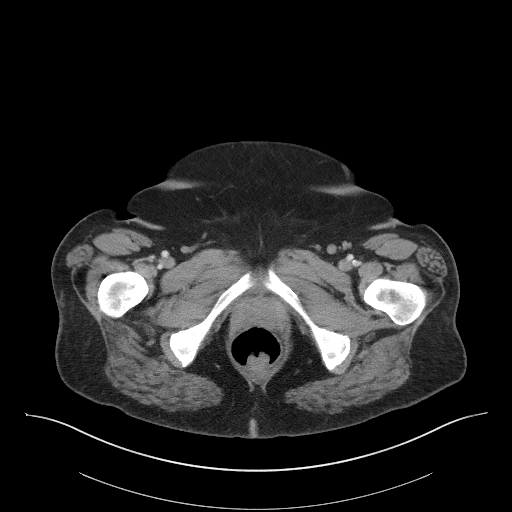
[im 19/90  soft-tissue]
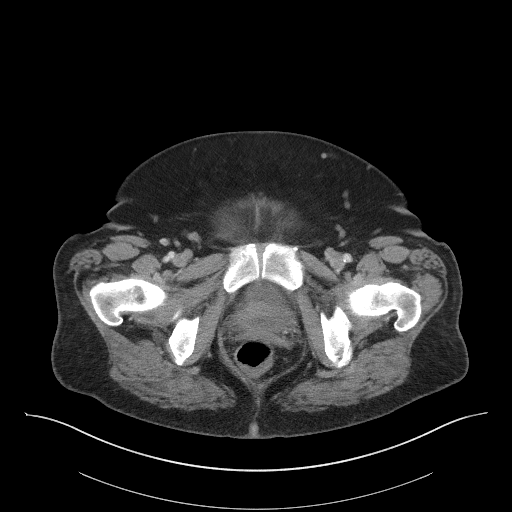
[im 29/90  soft-tissue]
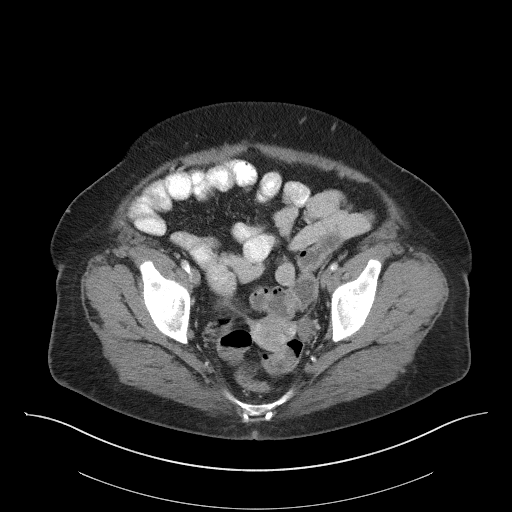
[im 33/90  soft-tissue]
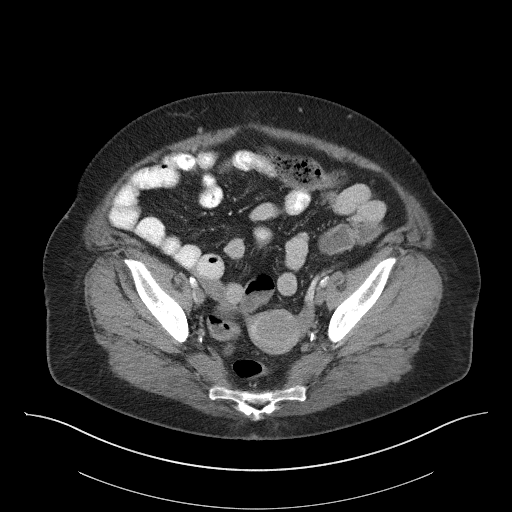
[im 43/90  soft-tissue]
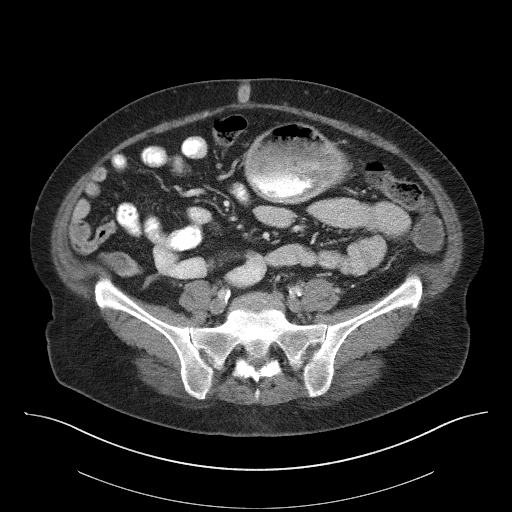
[im 47/90  soft-tissue]
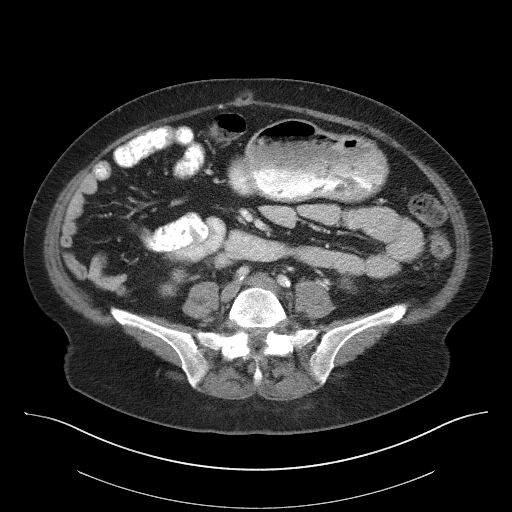
[im 57/90  soft-tissue]
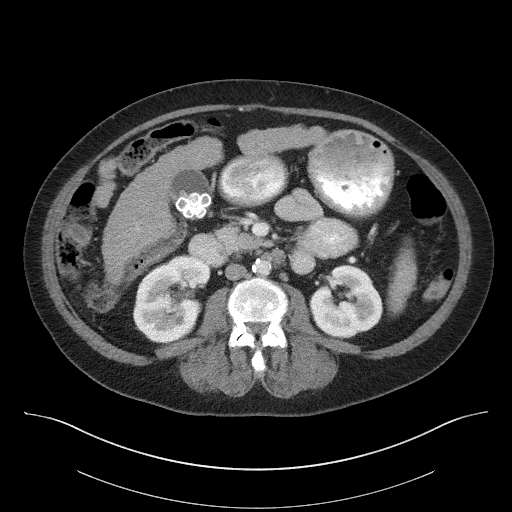
[im 61/90  soft-tissue]
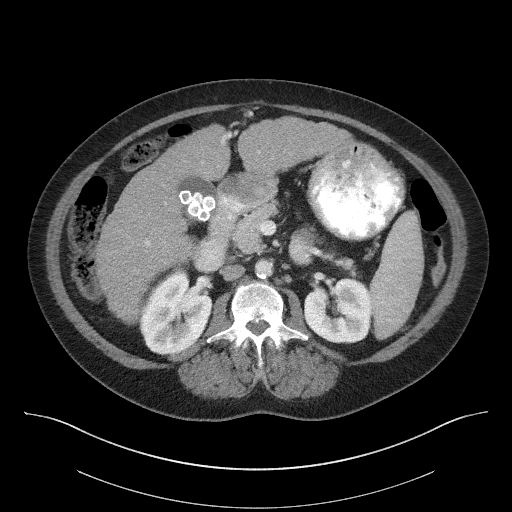
[im 61/90  bone]
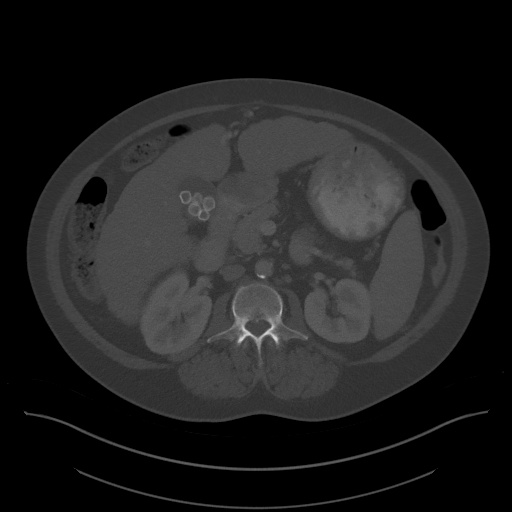
[im 71/90  soft-tissue]
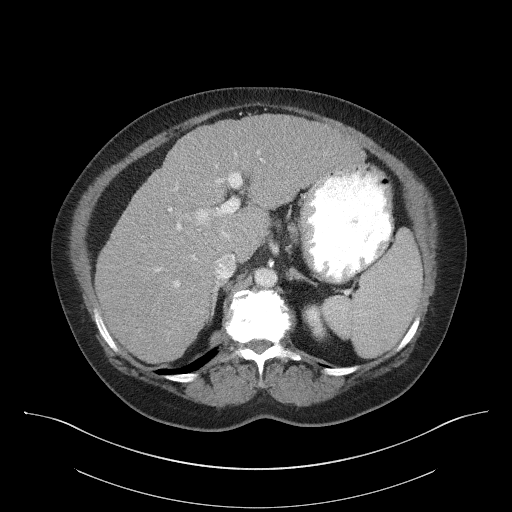
[im 75/90  soft-tissue]
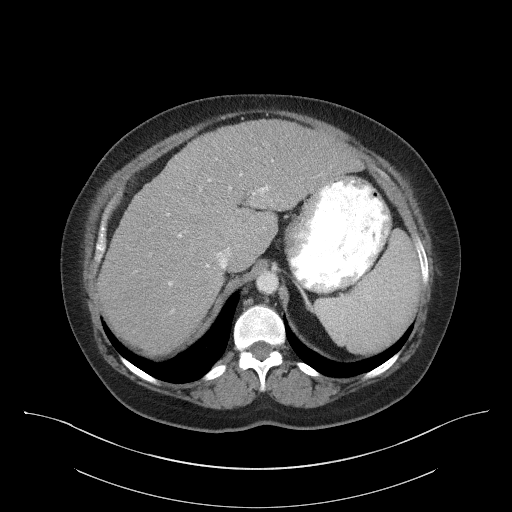
[im 85/90  soft-tissue]
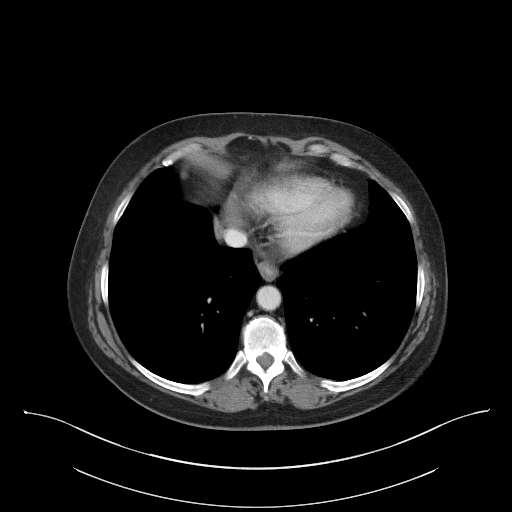

[Series 6: coronal st · coronal · 0.83mm/px · 3 of 109 slices shown]
[im 37/109  soft-tissue]
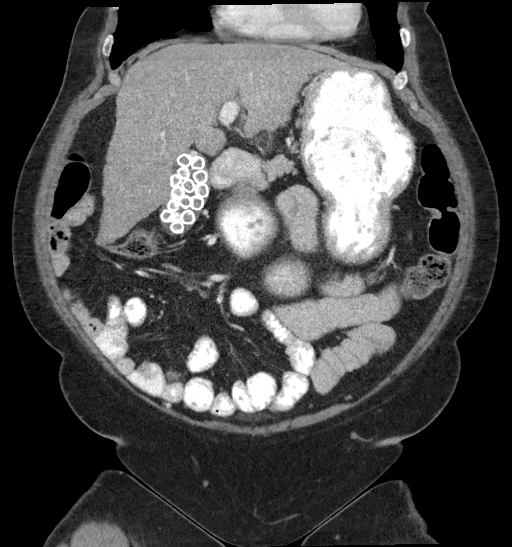
[im 49/109  soft-tissue]
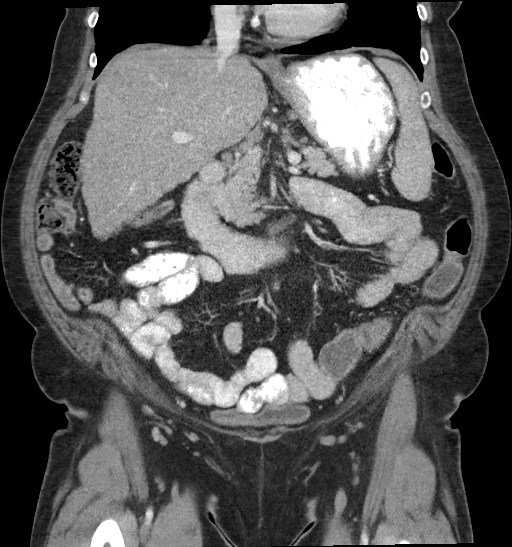
[im 61/109  soft-tissue]
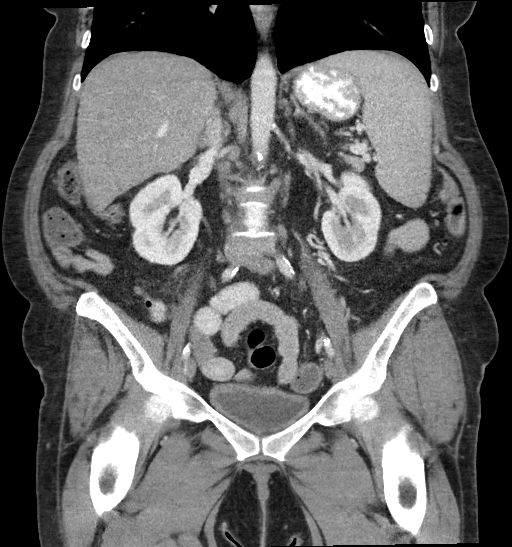

[15 of 46 positions shown; findings below may reference images not displayed]

RADIATION DOSE REDUCTION: This exam was performed according to the
departmental dose-optimization program which includes automated
exposure control, adjustment of the mA and/or kV according to
patient size and/or use of iterative reconstruction technique.

CONTRAST:  100mL OMNIPAQUE IOHEXOL 300 MG/ML  SOLN
FINDINGS: Lower chest: Mild distal esophageal wall thickening noted,
nonspecific although esophagitis would be a common cause.

A lymph node in the right pericardial adipose tissues measures
cm in short axis on image 5 series 2, stable from [DATE].

Hepatobiliary: Hepatic cirrhosis noted with nodular contour. No
appreciable abnormal enhancing mass on portal venous phase images or
in the included portion of the liver on delayed phase images.
Numerous gallstones measuring up to about 1.3 cm in diameter present
in the gallbladder. No gallbladder wall thickening or
pericholecystic fluid. No biliary dilatation or findings of
choledocholithiasis. There is some evidence of portal venous
hypertension with portal venous branches from segment 4b extending
along the anterior omentum to the inferior epigastric vein.

Pancreas: Unremarkable

Spleen: The spleen measures 10.9 by 6.1 by 12.8 cm (volume = 450
cm^3), compatible with mild splenomegaly. No focal splenic lesion
identified.

Adrenals/Urinary Tract: Both adrenal glands appear normal. 7 by 6 mm
hypodense lesion in the right the kidney on image 22 series 4 is
technically too small to characterize although statistically likely
to be a small cyst. In general, such lesions are not felt to warrant
further workup unless there is a specific renal clinical concern.

Stomach/Bowel: There are some fluid levels in the descending colon
which may indicate diarrheal process although also some formed stool
in the rectum. Normal appendix. No dilated bowel.

Vascular/Lymphatic: Atherosclerosis is present, including aortoiliac
atherosclerotic disease. Substantial atheromatous plaque likely
contributing to stenosis in the superior mesenteric artery noted
about 1.2 cm distal to the origin. Today's exam was not a CT
angiogram. Atheromatous plaque proximally in the left renal artery
noted.

There some notable porta hepatis lymph nodes including a 1.6 cm in
short axis peripancreatic/porta hepatis node on image 23 series 2
(formerly same on [DATE]) and a 1.3 cm portacaval node on image
26 series 2 (likewise stable). Although these nodes are prominent,
there most likely reactive to the patient's cirrhosis. Small
clustered periaortic lymph nodes are present but are not
pathologically enlarged.

Probable small lymph node adjacent to the right proximal twelfth rib
in the paraspinal location, roughly similar to prior lumbar spine
MRI exams.

Reproductive: Unremarkable

Other: No supplemental non-categorized findings.

Musculoskeletal: Degenerative endplate sclerosis at L1-2 as shown on
[DATE]. Grade 1 degenerative anterolisthesis at L4-5. Lower
lumbar spondylosis and degenerative disc disease.
IMPRESSION: 1. Normal appendix. No dilated bowel. A specific cause for the
patient's right lower quadrant symptoms is not identified.
2. Air fluid levels in the sigmoid colon could reflect a diarrheal
process.
3. Hepatic cirrhosis with chronic reactive adenopathy along the
porta hepatis.
4. Numerous gallstones in the gallbladder but without gallbladder
wall thickening.
5. Mild signs of portal venous hypertension.  Mild splenomegaly.
6. Mild distal esophageal wall thickening, nonspecific although
esophagitis would be a common cause.
7. Atherosclerosis is present, including aortoiliac atherosclerotic
disease. Systemic atherosclerosis with plaques causing stenosis in
the SMA and the left renal artery. Today's exam was not performed as
a CT angiogram.
8. Lumbar spondylosis and degenerative disc disease.

## 2021-09-30 MED ORDER — IOHEXOL 300 MG/ML  SOLN
100.0000 mL | Freq: Once | INTRAMUSCULAR | Status: AC | PRN
  Administered 2021-09-30: 100 mL via INTRAVENOUS

## 2021-09-30 NOTE — Progress Notes (Signed)
Alexandria Leblanc  ? ?Called and spoke with pt in detail about results.  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 Advised pt to stop drinking (drinks 5 beers (24 ounce size) on average 3 or more days a week). Urgent referral placed for GI, they may consider BB whether compensated vs decompensated cirrhosis.  ?Also with multiple gallstones, pt feels bloated and with nausea. Will refer as well to general surgeon.

## 2021-10-01 ENCOUNTER — Other Ambulatory Visit: Payer: Self-pay | Admitting: Family

## 2021-10-01 ENCOUNTER — Encounter: Payer: Self-pay | Admitting: Gastroenterology

## 2021-10-01 DIAGNOSIS — R748 Abnormal levels of other serum enzymes: Secondary | ICD-10-CM

## 2021-10-01 DIAGNOSIS — E871 Hypo-osmolality and hyponatremia: Secondary | ICD-10-CM

## 2021-10-01 DIAGNOSIS — I700 Atherosclerosis of aorta: Secondary | ICD-10-CM

## 2021-10-01 NOTE — Telephone Encounter (Signed)
Called and spoke with pt in detail in regards to CT scan. ?

## 2021-10-01 NOTE — Progress Notes (Signed)
D/w her in detail about findings.  ?Urgent referral placed for GI. Pt advised if any worsening pain/sob go to er immediately.  ?Consider CTA

## 2021-10-01 NOTE — Progress Notes (Signed)
Please advise pt that I also ordered something called a CTA on her which will look deeper at her arteries because she had significant plaquing of arteries. Referral coordinator should reach out in the next week or so to schedule. ? ?Salt was very low, lower than normal. Advise pt to start eating three olives day or slightly increase salt intake. Have her schedule a lab repeat preferably next Friday, for repeat salt. Orders placed already.

## 2021-10-02 ENCOUNTER — Telehealth: Payer: Self-pay | Admitting: Primary Care

## 2021-10-02 NOTE — Telephone Encounter (Signed)
Pt stated she missed a call and it was sent to another number. Also stated "if someone calls again to leave a detailed message on [phone]." Wants to know what the call was about, stated "no one left a name or reason."  ? ?Callback Number: [phone] ? ?

## 2021-10-07 ENCOUNTER — Telehealth: Payer: Self-pay | Admitting: Primary Care

## 2021-10-07 ENCOUNTER — Other Ambulatory Visit: Payer: Self-pay

## 2021-10-07 NOTE — Telephone Encounter (Signed)
Pt is returning [facility] call, states she missed her call. Does not know reason for the call. ? ?Callback Number: [phone] ?

## 2021-10-09 ENCOUNTER — Encounter: Payer: Self-pay | Admitting: Surgery

## 2021-10-09 ENCOUNTER — Ambulatory Visit: Payer: Managed Care, Other (non HMO) | Admitting: Surgery

## 2021-10-09 ENCOUNTER — Other Ambulatory Visit (INDEPENDENT_AMBULATORY_CARE_PROVIDER_SITE_OTHER): Payer: Managed Care, Other (non HMO)

## 2021-10-09 VITALS — BP 135/82 | HR 71 | Temp 98.7°F | Ht 64.0 in | Wt 182.8 lb

## 2021-10-09 DIAGNOSIS — K766 Portal hypertension: Secondary | ICD-10-CM

## 2021-10-09 DIAGNOSIS — K7030 Alcoholic cirrhosis of liver without ascites: Secondary | ICD-10-CM

## 2021-10-09 DIAGNOSIS — E871 Hypo-osmolality and hyponatremia: Secondary | ICD-10-CM | POA: Diagnosis not present

## 2021-10-09 DIAGNOSIS — K8020 Calculus of gallbladder without cholecystitis without obstruction: Secondary | ICD-10-CM

## 2021-10-09 DIAGNOSIS — R748 Abnormal levels of other serum enzymes: Secondary | ICD-10-CM | POA: Diagnosis not present

## 2021-10-09 LAB — LIPASE: Lipase: 60 U/L — ABNORMAL HIGH (ref 11.0–59.0)

## 2021-10-09 LAB — BASIC METABOLIC PANEL
BUN: 7 mg/dL (ref 6–23)
CO2: 27 mEq/L (ref 19–32)
Calcium: 9.6 mg/dL (ref 8.4–10.5)
Chloride: 97 mEq/L (ref 96–112)
Creatinine, Ser: 0.58 mg/dL (ref 0.40–1.20)
GFR: 104.93 mL/min (ref >60.00)
Glucose, Bld: 121 mg/dL — ABNORMAL HIGH (ref 70–99)
Potassium: 4.2 mEq/L (ref 3.5–5.1)
Sodium: 133 mEq/L — ABNORMAL LOW (ref 135–145)

## 2021-10-09 NOTE — Progress Notes (Signed)
?10/09/2021 ? ?Reason for Visit:  Cholelithiasis ? ?Requesting Provider:  Eugenia Pancoast, FNP ? ?History of Present Illness: ?Alexandria Leblanc is a 51 y.o. female presenting for evaluation of abdominal pain and cholelithiasis.  The patient saw Ms. Dugal on 09/29/21 with a 3 day history of upper abdominal pain, associated with nausea.  She had labwork on 09/29/21 and CT scan done on 09/30/21.  Her labs overall showed a lipase of 63, normal LFTs although elevated Alk Phos to 121, normal WBC, but hyponatremia of 127.  Her CT scan showed cholelithiasis without evidence of cholecystitis, but also cirrhosis with portal hypertension.  Pancreas was unremarkable.  Her pain subsided and she has not had recent abdominal pain but reports some soreness in the upper abdomen only.   ? ?The patient reports that she drinks 5 beers multiple times per day.  Before the pain episode, she had three straight days of drinking.  She also smokes about 1 pack per day.  Since the CT scan results and labs were discussed with the patient by Ms. Dugal, the patient reports that she's trying to cut down and she's drinking 4 beers instead now.  She reports that in the past she has tried to cut down both drinking and smoking without success.  She reports stressors at home and she has used drinking as a coping mechanism. ? ?Before this episode, she had not had episodes of pain, but reports some intermittent nausea.  She's not sure if there was any relationship with food intake or if it was more hunger type of sensation. ? ?Past Medical History: ?Past Medical History:  ?Diagnosis Date  ? Acute non-recurrent sinusitis 07/06/2021  ? Asthma   ? Genital warts   ? GERD (gastroesophageal reflux disease)   ? Hypertension   ? Migraines   ? Type 2 diabetes mellitus ([place])   ?  ? ?Past Surgical History: ?History reviewed. No pertinent surgical history. ? ?Home Medications: ?Prior to Admission medications   ?Medication Sig Start Date End Date Taking? Authorizing Provider   ?acyclovir (ZOVIRAX) 400 MG tablet Take 1 tablet (400 mg total) by mouth 2 (two) times daily. For herpes prevention. Office visit required for further refills. 08/18/21  Yes Pleas Koch, NP  ?albuterol (VENTOLIN HFA) 108 (90 Base) MCG/ACT inhaler INHALE 2 PUFFS INTO LUNGS EVERY 6 HOURS AS NEEDED FOR WHEEZING OR SHORTNESS OF BREATH 08/25/21  Yes Pleas Koch, NP  ?Blood Glucose Monitoring Suppl (FREESTYLE LITE) w/Device KIT 1 each by Does not apply route as directed. DX E11.9 08/11/20  Yes Pleas Koch, NP  ?fluticasone furoate-vilanterol (BREO ELLIPTA) 100-25 MCG/ACT AEPB INHALE 1 PUFF INTO THE LUNGS DAILY 05/01/21  Yes Pleas Koch, NP  ?glipiZIDE (GLUCOTROL XL) 10 MG 24 hr tablet Take 1 tablet (10 mg total) by mouth daily with breakfast. for diabetes. 09/14/21  Yes Pleas Koch, NP  ?glucose blood test strip Use to test blood sugars up to three times a day Dx E11.9 08/11/20  Yes Pleas Koch, NP  ?hydrochlorothiazide (HYDRODIURIL) 25 MG tablet Take 1 tablet (25 mg total) by mouth daily. For blood pressure. 11/12/20  Yes Pleas Koch, NP  ?Lancets (FREESTYLE) lancets 1 each 3 (three) times daily. 08/11/20  Yes [provider]  ?metFORMIN (GLUCOPHAGE) 1000 MG tablet TAKE 1 TABLET BY MOUTH TWICE A DAY FOR DIABETES 09/09/21  Yes Pleas Koch, NP  ?montelukast (SINGULAIR) 10 MG tablet Take 1 tablet (10 mg total) by mouth at bedtime. For  allergies and asthma 09/08/21  Yes Pleas Koch, NP  ?olmesartan (BENICAR) 40 MG tablet Take 1 tablet (40 mg total) by mouth daily. for blood pressure. 09/08/21  Yes Pleas Koch, NP  ?ondansetron (ZOFRAN) 4 MG tablet Take 1 tablet (4 mg total) by mouth every 8 (eight) hours as needed for nausea or vomiting. 09/29/21  Yes Eugenia Pancoast, FNP  ?pantoprazole (PROTONIX) 40 MG tablet Take 1 tablet (40 mg total) by mouth daily. 09/29/21 10/29/21 Yes Eugenia Pancoast, FNP  ?pravastatin (PRAVACHOL) 40 MG tablet Take 1 tablet (40 mg total) by  mouth daily. For cholesterol 03/15/21  Yes Pleas Koch, NP  ?Semaglutide,0.25 or 0.5MG/DOS, (OZEMPIC, 0.25 OR 0.5 MG/DOSE,) 2 MG/1.5ML SOPN Inject 0.25 mg into the skin once weekly for 4 weeks, increase to 0.5 mg weekly thereafter for diabetes. 09/08/21  Yes Pleas Koch, NP  ? ? ?Allergies: ?Allergies  ?Allergen Reactions  ? Penicillins Hives and Itching  ? Sulfa Antibiotics Hives  ? ? ?Social History: ? reports that she has been smoking cigarettes. She has a 35.00 pack-year smoking history. She has never used smokeless tobacco. She reports current alcohol use. She reports that she does not use drugs.  ? ?Family History: ?Family History  ?Problem Relation Age of Onset  ? Heart disease Mother   ? Hypertension Mother   ? Dementia Mother   ? COPD Father   ? Hypertension Father   ? Hyperlipidemia Brother   ? Prostate cancer Paternal Uncle   ? Alzheimer's disease Sister   ? ? ?Review of Systems: ?Review of Systems  ?Constitutional:  Negative for chills and fever.  ?HENT:  Negative for hearing loss.   ?Respiratory:  Negative for shortness of breath.   ?Cardiovascular:  Negative for chest pain.  ?Gastrointestinal:  Positive for abdominal pain and nausea. Negative for diarrhea.  ?Genitourinary:  Negative for dysuria.  ?Musculoskeletal:  Negative for myalgias.  ?Skin:  Negative for rash.  ?Neurological:  Negative for dizziness.  ?Psychiatric/Behavioral:  Negative for depression.   ? ?Physical Exam ?BP 135/82   Pulse 71   Temp 98.7 ?F (37.1 ?C) (Oral)   Ht 5' 4"  (1.626 m)   Wt 182 lb 12.8 oz (82.9 kg)   LMP  (LMP Unknown)   SpO2 96%   BMI 31.38 kg/m?  ?CONSTITUTIONAL: No acute distress, well nourished. ?HEENT:  Normocephalic, atraumatic, extraocular motion intact. ?NECK: Trachea is midline, and there is no jugular venous distension.  ?RESPIRATORY:  Lungs are clear, and breath sounds are equal bilaterally. Normal respiratory effort without pathologic use of accessory muscles. ?CARDIOVASCULAR: Heart is  regular without murmurs, gallops, or rubs. ?GI: The abdomen is soft, non-distended, with some soreness in the mid epigastric area.  Negative Murphy's sign.  No peritonitis.  ?MUSCULOSKELETAL:  Normal muscle strength and tone in all four extremities.  No peripheral edema or cyanosis. ?SKIN: Skin turgor is normal. There are no pathologic skin lesions.  ?NEUROLOGIC:  Motor and sensation is grossly normal.  Cranial nerves are grossly intact. ?PSYCH:  Alert and oriented to person, place and time. Affect is normal. ? ?Laboratory Analysis: ?Labs from 09/29/2021: ?Sodium 127, potassium 4.2, chloride 93, CO2 23, BUN 15, creatinine 0.92.  Total bilirubin 0.4, AST 30, ALT 24, alkaline phosphatase 121, albumin 4.3, lipase 63, amylase 38.  WBC 7.5, hemoglobin 14, hematocrit 41.7, platelets 127 ? ?Imaging: ?CT abdomen/pelvis on 09/30/2021: ?IMPRESSION: ?1. Normal appendix. No dilated bowel. A specific cause for the patient's right lower quadrant symptoms is not identified. ?  2. Air fluid levels in the sigmoid colon could reflect a diarrheal process. ?3. Hepatic cirrhosis with chronic reactive adenopathy along the porta hepatis. ?4. Numerous gallstones in the gallbladder but without gallbladder wall thickening. ?5. Mild signs of portal venous hypertension.  Mild splenomegaly. ?6. Mild distal esophageal wall thickening, nonspecific although esophagitis would be a common cause. ?7. Atherosclerosis is present, including aortoiliac atherosclerotic ?disease. Systemic atherosclerosis with plaques causing stenosis in ?the SMA and the left renal artery. Today's exam was not performed as ?a CT angiogram. ?8. Lumbar spondylosis and degenerative disc disease. ? ?Assessment and Plan: ?This is a 51 y.o. female with cholelithiasis. ? ?- Discussed with patient that based on her history, as well as findings on her CT scan and her laboratory work-up, to me it is unclear if the patient was having an episode of pancreatitis or an episode of biliary  colic.  She did have binge drinking for 3 days before the pain started and her lipase was mildly elevated on her lab work, but she does have cholelithiasis on her CT scan.  On CT scan, she also has findings of cirrhosi

## 2021-10-09 NOTE — Patient Instructions (Addendum)
Let us know if you continue to have the episodes. We will send a referral to a provider of your choice.  ? ? ?Gallbladder Eating Plan ?High blood cholesterol, obesity, a sedentary lifestyle, an unhealthy diet, and diabetes are risk factors for developing gallstones. ?If you have a gallbladder condition, you may have trouble digesting fats and tolerating high fat intake. Eating a low-fat diet can help reduce your symptoms and may be helpful before and after having surgery to remove your gallbladder (cholecystectomy). Your health care provider may recommend that you work with a dietitian to help you reduce the amount of fat in your diet. ?What are tips for following this plan? ?General guidelines ?Limit your fat intake to less than 30% of your total daily calories. If you eat around 1,800 calories each day, this means eating less than 60 grams (g) of fat per day. ?Fat is an important part of a healthy diet. Eating a low-fat diet can make it hard to maintain a healthy body weight. Ask your dietitian how much fat, calories, and other nutrients you need each day. ?Eat small, frequent meals throughout the day instead of three large meals. ?Drink at least 8-10 cups (1.9-2.4 L) of fluid a day. Drink enough fluid to keep your urine pale yellow. ?If you drink alcohol: ?Limit how much you have to: ?0-1 drink a day for women who are not pregnant. ?0-2 drinks a day for men. ?Know how much alcohol is in a drink. In the [place] one drink equals one 12 oz bottle of beer (355 mL), one 5 oz glass of wine (148 mL), or one 1? oz glass of hard liquor (44 mL). ?Reading food labels ? ?Check nutrition facts on food labels for the amount of fat per serving. Choose foods with less than 3 grams of fat per serving. ?Shopping ?Choose nonfat and low-fat healthy foods. Look for the words "nonfat," "low-fat," or "fat-free." ?Avoid buying processed or prepackaged foods. ?Cooking ?Cook using low-fat methods, such as baking, broiling, grilling, or  boiling. ?Cook with small amounts of healthy fats, such as olive oil, grapeseed oil, canola oil, avocado oil, or sunflower oil. ?What foods are recommended? ? ?All fresh, frozen, or canned fruits and vegetables. ?Whole grains. ?Low-fat or nonfat (skim) milk and yogurt. ?Lean meat, skinless poultry, fish, eggs, and beans. ?Low-fat protein supplement powders or drinks. ?Spices and herbs. ?The items listed above may not be a complete list of foods and beverages you can eat and drink. Contact a dietitian for more information. ?What foods are not recommended? ?High-fat foods. These include baked goods, fast food, fatty cuts of meat, ice cream, french toast, sweet rolls, pizza, cheese bread, foods covered with butter, creamy sauces, or cheese. ?Fried foods. These include french fries, tempura, battered fish, breaded chicken, fried breads, and sweets. ?Foods that cause bloating and gas. ?The items listed above may not be a complete list of foods that you should avoid. Contact a dietitian for more information. ?Summary ?A low-fat diet can be helpful if you have a gallbladder condition, or before and after gallbladder surgery. ?Limit your fat intake to less than 30% of your total daily calories. This is about 60 g of fat if you eat 1,800 calories each day. ?Eat small, frequent meals throughout the day instead of three large meals. ?This information is not intended to replace advice given to you by your health care provider. Make sure you discuss any questions you have with your health care provider. ?Document Revised: 05/01/2021 Document Reviewed:  05/01/2021 ?Elsevier Patient Education ? [place]. ? ? ? ?Minimally Invasive Cholecystectomy ?Minimally invasive cholecystectomy is surgery to remove the gallbladder. The gallbladder is a pear-shaped organ that lies beneath the liver on the right side of the body. The gallbladder stores bile, which is a fluid that helps the body digest fats. Cholecystectomy is often done  to treat inflammation (irritation and swelling) of the gallbladder (cholecystitis). This condition is usually caused by a buildup of gallstones (cholelithiasis) in the gallbladder or when the fluid in the gall bladder becomes stagnant because gallstones get stuck in the ducts (tubes) and block the flow of bile. This can result in inflammation and pain. In severe cases, emergency surgery may be required. ?This procedure is done through small incisions in the abdomen, instead of one large incision. It is also called laparoscopic surgery. A thin scope with a camera (laparoscope) is inserted through one incision. Then surgical instruments are inserted through the other incisions. In some cases, a minimally invasive surgery may need to be changed to a surgery that is done through a larger incision. This is called open surgery. ?Tell a health care provider about: ?Any allergies you have. ?All medicines you are taking, including vitamins, herbs, eye drops, creams, and over-the-counter medicines. ?Any problems you or family members have had with anesthetic medicines. ?Any bleeding problems you have. ?Any surgeries you have had. ?Any medical conditions you have. ?Whether you are pregnant or may be pregnant. ?What are the risks? ?Generally, this is a safe procedure. However, problems may occur, including: ?Infection. ?Bleeding. ?Allergic reactions to medicines. ?Damage to nearby structures or organs. ?A gallstone remaining in the common bile duct. The common bile duct carries bile from the gallbladder to the small intestine. ?A bile leak from the liver or cystic duct after your gallbladder is removed. ?What happens before the procedure? ?When to stop eating and drinking ?Follow instructions from your health care provider about what you may eat and drink before your procedure. These may include: ?8 hours before the procedure ?Stop eating most foods. Do not eat meat, fried foods, or fatty foods. ?Eat only light foods, such as  toast or crackers. ?All liquids are okay except energy drinks and alcohol. ?6 hours before the procedure ?Stop eating. ?Drink only clear liquids, such as water, clear fruit juice, black coffee, plain tea, and sports drinks. ?Do not drink energy drinks or alcohol. ?2 hours before the procedure ?Stop drinking all liquids. ?You may be allowed to take medicines with small sips of water. ?If you do not follow your health care provider's instructions, your procedure may be delayed or canceled. ?Medicines ?Ask your health care provider about: ?Changing or stopping your regular medicines. This is especially important if you are taking diabetes medicines or blood thinners. ?Taking medicines such as aspirin and ibuprofen. These medicines can thin your blood. Do not take these medicines unless your health care provider tells you to take them. ?Taking over-the-counter medicines, vitamins, herbs, and supplements. ?General instructions ?If you will be going home right after the procedure, plan to have a responsible adult: ?Take you home from the hospital or clinic. You will not be allowed to drive. ?Care for you for the time you are told. ?Do not use any products that contain nicotine or tobacco for at least 4 weeks before the procedure. These products include cigarettes, chewing tobacco, and vaping devices, such as e-cigarettes. If you need help quitting, ask your health care provider. ?Ask your health care provider: ?How your surgery  site will be marked. ?What steps will be taken to help prevent infection. These may include: ?Removing hair at the surgery site. ?Washing skin with a germ-killing soap. ?Taking antibiotic medicine. ?What happens during the procedure? ? ?An IV will be inserted into one of your veins. ?You will be given one or both of the following: ?A medicine to help you relax (sedative). ?A medicine to make you fall asleep (general anesthetic). ?Your surgeon will make several small incisions in your abdomen. ?The  laparoscope will be inserted through one of the small incisions. The camera on the laparoscope will send images to a monitor in the operating room. This lets your surgeon see inside your abdomen. ?A gas wil

## 2021-10-12 ENCOUNTER — Encounter: Payer: Self-pay | Admitting: Family

## 2021-10-12 NOTE — Progress Notes (Signed)
FYI ?Repeat lipase still elevated ?CT pancreas unremarkable ?Likely r/t portal hyptension ?Has GI appt 5/23 ?Sodium slight improvement

## 2021-10-13 NOTE — Telephone Encounter (Signed)
I spoke with pt and on 10/11/21 pt at spicy food and had pain level of 10 in gallbladder area. Today pts pain level is 3 but pt ate a mango this morning and pt does not think that is agreeing with her. Pt just saw Dr Olean Ree for abd pain and cholelithiasis. Pt will call Dr Hampton Abbot now. UC & ED precautions given and pt voiced understanding. Sending note to Gentry Fitz NP and [facility] CMA. ?

## 2021-10-13 NOTE — Telephone Encounter (Signed)
Noted and agree with triage recommendations.  ?

## 2021-10-20 ENCOUNTER — Encounter: Payer: Self-pay | Admitting: Gastroenterology

## 2021-10-20 ENCOUNTER — Other Ambulatory Visit (INDEPENDENT_AMBULATORY_CARE_PROVIDER_SITE_OTHER): Payer: Managed Care, Other (non HMO)

## 2021-10-20 ENCOUNTER — Ambulatory Visit: Payer: Managed Care, Other (non HMO) | Admitting: Gastroenterology

## 2021-10-20 ENCOUNTER — Encounter: Payer: Self-pay | Admitting: Internal Medicine

## 2021-10-20 ENCOUNTER — Encounter: Payer: Self-pay | Admitting: *Deleted

## 2021-10-20 ENCOUNTER — Ambulatory Visit
Admission: RE | Admit: 2021-10-20 | Discharge: 2021-10-20 | Disposition: A | Discharge status: Home / Self Care | Payer: Managed Care, Other (non HMO) | Source: Ambulatory Visit | Attending: Family | Admitting: Family

## 2021-10-20 VITALS — BP 140/80 | HR 82 | Ht 64.0 in | Wt 181.0 lb

## 2021-10-20 DIAGNOSIS — Z1211 Encounter for screening for malignant neoplasm of colon: Secondary | ICD-10-CM

## 2021-10-20 DIAGNOSIS — R933 Abnormal findings on diagnostic imaging of other parts of digestive tract: Secondary | ICD-10-CM | POA: Insufficient documentation

## 2021-10-20 DIAGNOSIS — K7460 Unspecified cirrhosis of liver: Secondary | ICD-10-CM | POA: Diagnosis not present

## 2021-10-20 DIAGNOSIS — I700 Atherosclerosis of aorta: Secondary | ICD-10-CM

## 2021-10-20 DIAGNOSIS — R1013 Epigastric pain: Secondary | ICD-10-CM

## 2021-10-20 LAB — IBC + FERRITIN
Ferritin: 13.2 ng/mL (ref 10.0–291.0)
Iron: 24 ug/dL — ABNORMAL LOW (ref 42–145)
Saturation Ratios: 5.1 % — ABNORMAL LOW (ref 20.0–50.0)
TIBC: 474.6 ug/dL — ABNORMAL HIGH (ref 250.0–450.0)
Transferrin: 339 mg/dL (ref 212.0–360.0)

## 2021-10-20 LAB — PROTIME-INR
INR: 1 ratio (ref 0.8–1.0)
Prothrombin Time: 11.1 s (ref 9.6–13.1)

## 2021-10-20 IMAGING — CT CT CTA ABD/PEL W/CM AND/OR W/O CM
1 of 3 series · 12 of 32 positions shown, 16 images · IV contrast (agent unspecified)
Comparison: CT abdomen/pelvis [DATE]

CLINICAL DATA: Upper abdominal pain for 3 weeks.

EXAM:
CTA ABDOMEN AND PELVIS WITHOUT AND WITH CONTRAST
TECHNIQUE: Multidetector CT imaging of the abdomen and pelvis was performed
using the standard protocol during bolus administration of
intravenous contrast. Multiplanar reconstructed images and MIPs were
obtained and reviewed to evaluate the vascular anatomy.

[Series 6: arterial thins · axial · arterial · 0.96mm/px · z∈[-464,-32]mm · 12 of 820 slices shown, 16 images]
[im 66/820  soft-tissue]
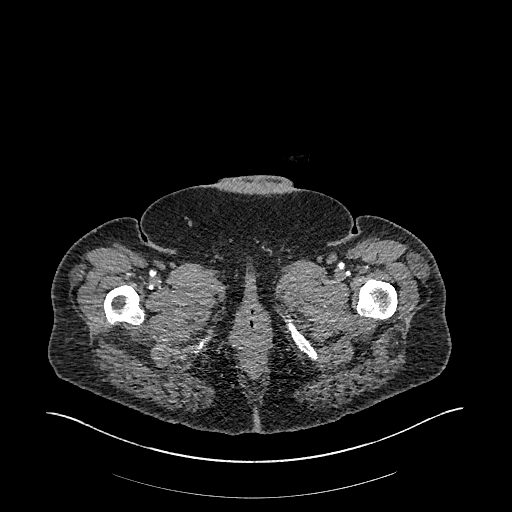
[im 66/820  bone]
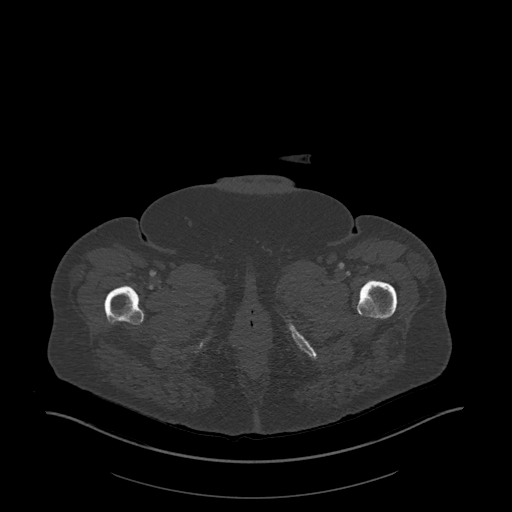
[im 132/820  soft-tissue]
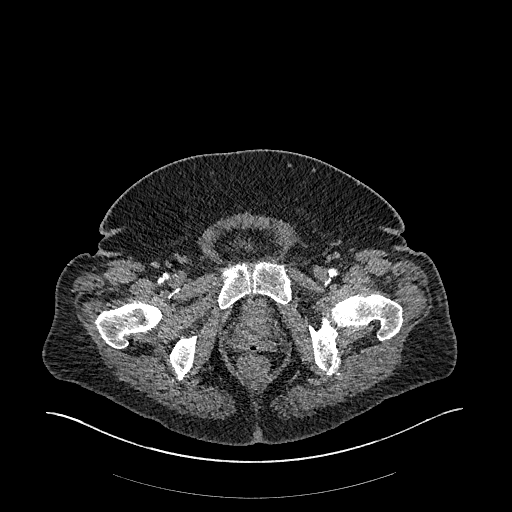
[im 230/820  soft-tissue]
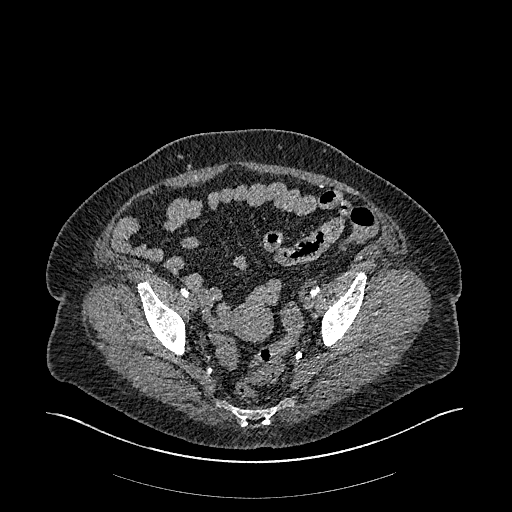
[im 295/820  soft-tissue]
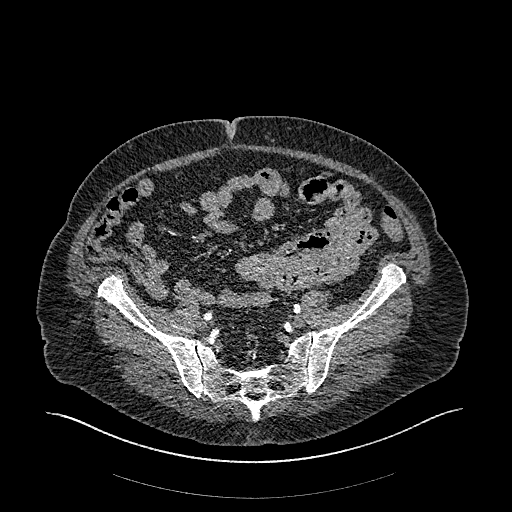
[im 361/820  soft-tissue]
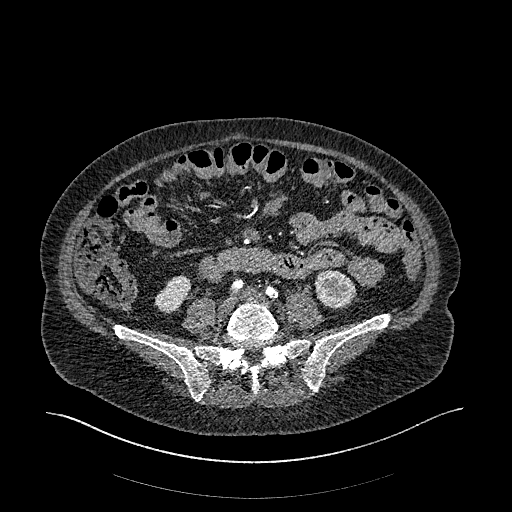
[im 459/820  soft-tissue]
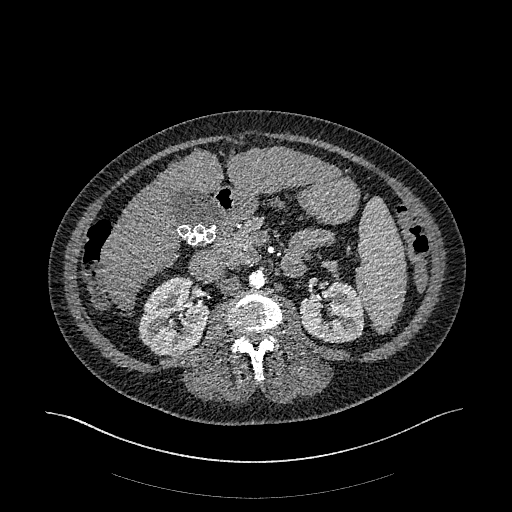
[im 525/820  soft-tissue]
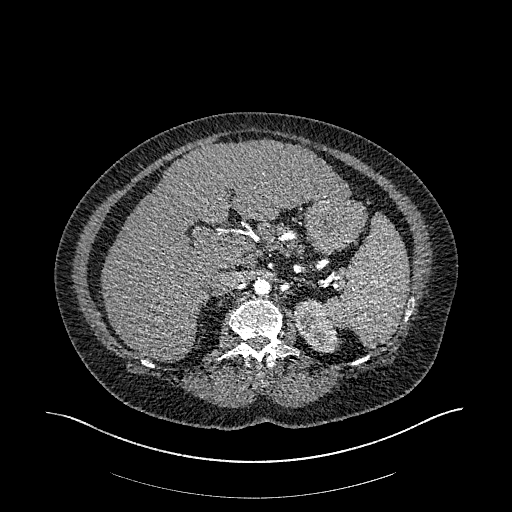
[im 623/820  soft-tissue]
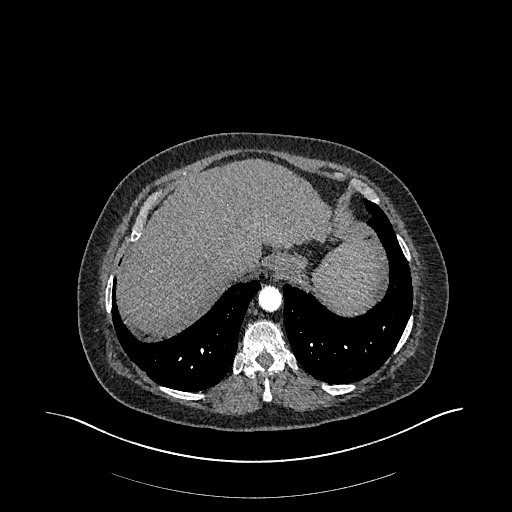
[im 688/820  soft-tissue]
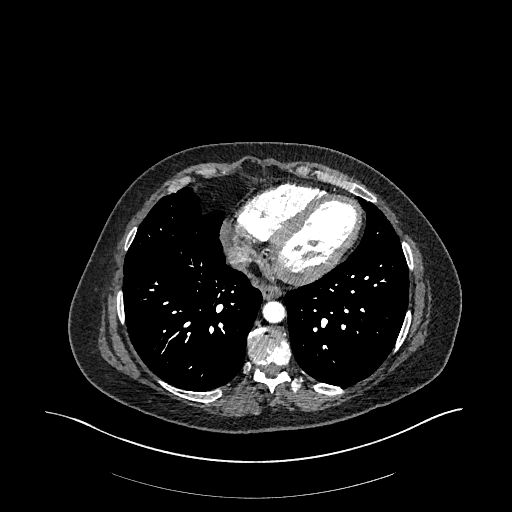
[im 688/820  lung]
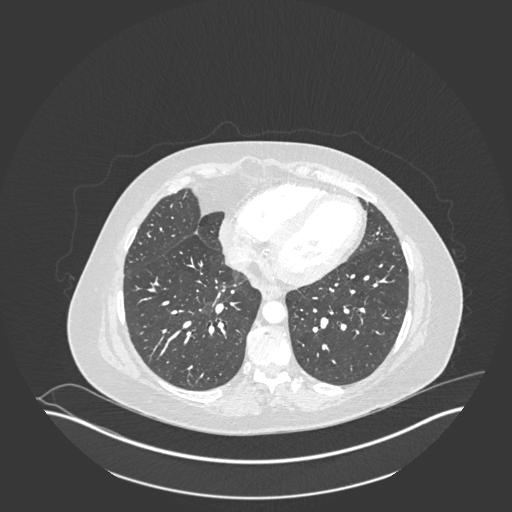
[im 688/820  bone]
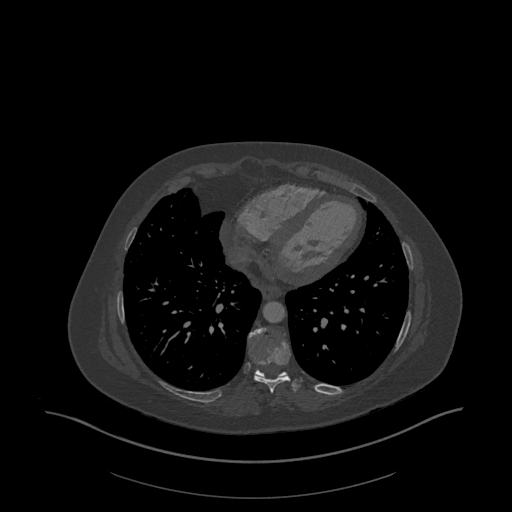
[im 721/820  lung]
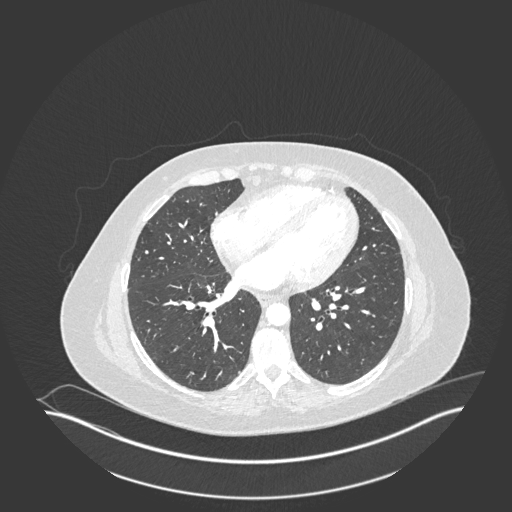
[im 754/820  soft-tissue]
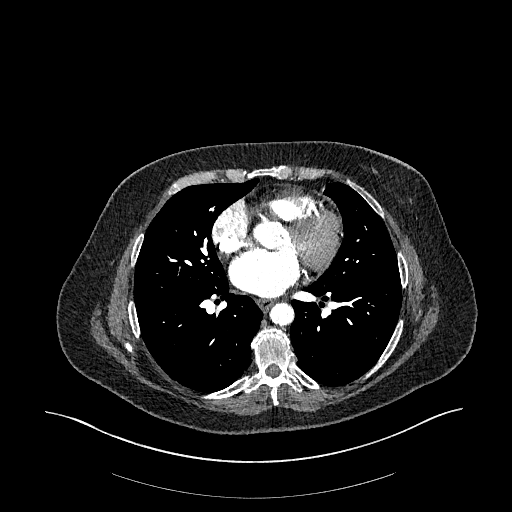
[im 754/820  lung]
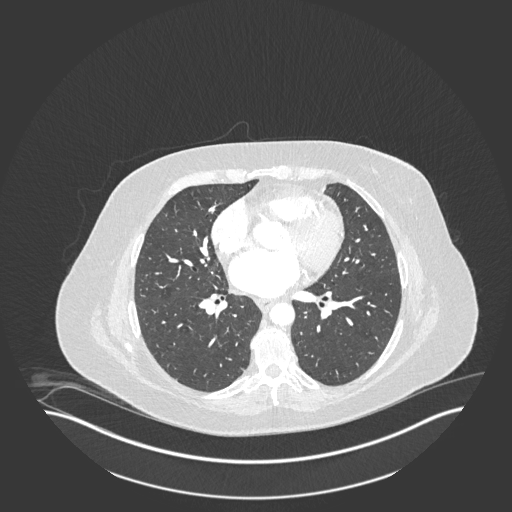
[im 787/820  lung]
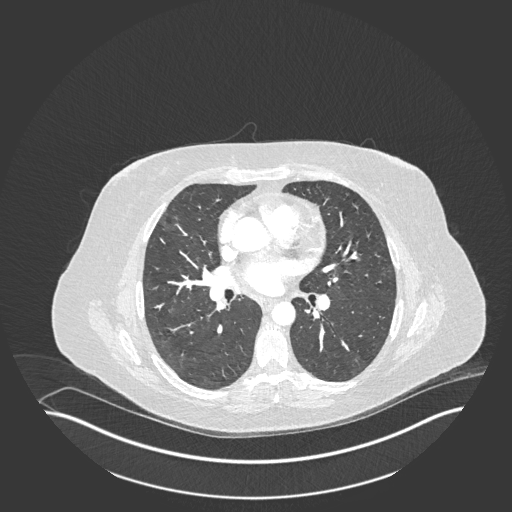

[12 of 32 positions shown; findings below may reference images not displayed]

RADIATION DOSE REDUCTION: This exam was performed according to the
departmental dose-optimization program which includes automated
exposure control, adjustment of the mA and/or kV according to
patient size and/or use of iterative reconstruction technique.

CONTRAST:  75mL [HQ] IOPAMIDOL ([HQ]) INJECTION 76%
FINDINGS: VASCULAR

Aorta: Normal caliber aorta without aneurysm, dissection, vasculitis
or significant stenosis. Scattered atherosclerotic plaque.

Celiac: Patent without evidence of aneurysm, dissection, vasculitis
or significant stenosis.

SMA: Mild stenosis of the proximal SMA secondary to focal calcified
atherosclerotic plaque.

Renals: Small accessory artery to the upper pole of the left kidney.
No significant stenosis, occlusion or evidence of fibromuscular
dysplasia.

IMA: Patent without evidence of aneurysm, dissection, vasculitis or
significant stenosis.

Inflow: Patent without evidence of aneurysm, dissection, vasculitis
or significant stenosis.

Proximal Outflow: Bilateral common femoral and visualized portions
of the superficial and profunda femoral arteries are patent without
evidence of aneurysm, dissection, vasculitis or significant
stenosis.

Veins: No obvious venous abnormality within the limitations of this
arterial phase study.

Review of the MIP images confirms the above findings.

NON-VASCULAR

Lower chest: Stable small scattered pulmonary nodules. None measure
larger than 0.4 mm. Calcifications visualized within the coronary
arteries. The heart is normal in size. No pericardial effusion.
Juxta cardiac lymph node stable at 7 mm in short axis. Mild
circumferential thickening of the distal esophagus.

Hepatobiliary: Hepatic cirrhosis. No arterially enhancing lesion to
suggest hepatocellular carcinoma. Extensive cholelithiasis. No intra
or extrahepatic biliary ductal dilatation.

Pancreas: Unremarkable. No pancreatic ductal dilatation or
surrounding inflammatory changes.

Spleen: Normal in size without focal abnormality.

Adrenals/Urinary Tract: Adrenal glands are unremarkable. Kidneys are
normal, without renal calculi, focal lesion, or hydronephrosis.
Bladder is unremarkable. Tiny low-attenuation lesion in the
posterior interpolar right kidney is too small to characterize but
remains unchanged and is statistically highly likely a benign cyst.

Stomach/Bowel: Stomach is within normal limits. Appendix appears
normal. No evidence of bowel wall thickening, distention, or
inflammatory changes.

Lymphatic: Stable porta hepatis lymphadenopathy measuring up to
cm.

Reproductive: Uterus and bilateral adnexa are unremarkable.

Other: No abdominal wall hernia or abnormality. No abdominopelvic
ascites.

Musculoskeletal: Stable focal L1-L2 degenerative disc disease. The
focality of the findings suggest the possibility of prior
osteomyelitis discitis, but overall the appearance is unchanged
compared to prior and does not appear acute.
IMPRESSION: VASCULAR

1. No acute vascular pathology to suggest acute ischemia.
2. Mild to moderate stenosis of the proximal SMA secondary to
atherosclerotic plaque. This finding is unlikely to be sufficient to
cause symptoms of chronic mesenteric ischemia.
3.  Aortic Atherosclerosis ([HQ]-170.0)
4. Small accessory left upper pole renal artery.
5. Coronary artery calcifications.

NON-VASCULAR

1. Hepatic cirrhosis with associated porta hepatis and juxta cardiac
lymphadenopathy likely related to the intrinsic liver disease. No
evidence of ascites or significant varix formation.
2. Circumferential thickening of the distal esophagus may be
reflective of esophagitis. Does the patient have a clinical history
of GERD? Early esophageal neoplasm could have a similar appearance.
3. Stable scattered small bilateral pulmonary nodules. No follow-up
needed if patient is low-risk (and has no known or suspected primary
neoplasm). Non-contrast chest CT can be considered in 12 months if
patient is high-risk. This recommendation follows the consensus
statement: Guidelines for Management of Incidental Pulmonary Nodules
Detected on CT Images:From the [HOSPITAL] [HQ]; published
online before print (10.1148/radiol.[PHONE_NUMBER]).
4. Cholelithiasis.
5. Stable focal advanced L1-L2 degenerative disc disease.
6. Additional ancillary findings as above without significant
interval change.

## 2021-10-20 MED ORDER — PANTOPRAZOLE SODIUM 40 MG PO TBEC: 40.0000 mg | DELAYED_RELEASE_TABLET | Freq: Two times a day (BID) | ORAL | 0 refills | Status: DC

## 2021-10-20 MED ORDER — NA SULFATE-K SULFATE-MG SULF 17.5-3.13-1.6 GM/177ML PO SOLN: 1.0000 | Freq: Once | ORAL | 0 refills | Status: AC

## 2021-10-20 MED ORDER — IOPAMIDOL (ISOVUE-370) INJECTION 76%
75.0000 mL | Freq: Once | INTRAVENOUS | Status: AC | PRN
  Administered 2021-10-20: 75 mL via INTRAVENOUS

## 2021-10-20 NOTE — Progress Notes (Signed)
10/20/2021 Alexandria Leblanc [id number] Oct 25, 1970   HISTORY OF PRESENT ILLNESS: This is a 51 year old female who is new to our office.  She has been referred here by Georgina Peer, FNP, for evaluation regarding a new diagnosis of cirrhosis.  She tells me that she has been a heavy drinker for a long time.  Has been trying to cut back as of late since her diagnosis.  She was being evaluated for complaints of upper abdominal pain and nausea.  She says that 2 to 3 weeks ago she started feeling sick and it lasted about 3 days.  LFTs were normal except for slightly elevated alk phos at 121, renal function is normal.  CBC shows a low platelet count at 127, but otherwise is normal.  She says that she was on omeprazole 20 mg daily, but then that was increased to 40 mg daily and now was switched over to pantoprazole 40 mg daily.  Has never had a colonoscopy in the past.  Denies rectal bleeding.  Admits to some mild lower extremity swelling, but she does stand on her feet all day for work.  No family history of liver disease to her knowledge.   CT scan of the abdomen and pelvis with contrast:  IMPRESSION: 1. Normal appendix. No dilated bowel. A specific cause for the patient's right lower quadrant symptoms is not identified. 2. Air fluid levels in the sigmoid colon could reflect a diarrheal process. 3. Hepatic cirrhosis with chronic reactive adenopathy along the porta hepatis. 4. Numerous gallstones in the gallbladder but without gallbladder wall thickening. 5. Mild signs of portal venous hypertension.  Mild splenomegaly. 6. Mild distal esophageal wall thickening, nonspecific although esophagitis would be a common cause. 7. Atherosclerosis is present, including aortoiliac atherosclerotic disease. Systemic atherosclerosis with plaques causing stenosis in the SMA and the left renal artery. Today's exam was not performed as a CT angiogram. 8. Lumbar spondylosis and degenerative disc disease.     Past Medical History:  Diagnosis Date   Acute non-recurrent sinusitis 07/06/2021   Alcohol abuse    Anxiety    Arthritis    Asthma    Depression    Gallstones    Genital warts    GERD (gastroesophageal reflux disease)    Hypertension    Migraines    Obesity    Type 2 diabetes mellitus ([place])    History reviewed. No pertinent surgical history.  reports that she has been smoking cigarettes. She has a 35.00 pack-year smoking history. She has never used smokeless tobacco. She reports current alcohol use. She reports that she does not use drugs. family history includes Alzheimer's disease in her sister; COPD in her father; Colon cancer in her paternal uncle; Dementia in her mother; Diabetes in her brother and mother; Heart disease in her mother; Hyperlipidemia in her brother; Hypertension in her father and mother; Prostate cancer in her paternal uncle. Allergies  Allergen Reactions   Penicillins Hives and Itching   Sulfa Antibiotics Hives      Outpatient Encounter Medications as of 10/20/2021  Medication Sig   acyclovir (ZOVIRAX) 400 MG tablet Take 1 tablet (400 mg total) by mouth 2 (two) times daily. For herpes prevention. Office visit required for further refills.   albuterol (VENTOLIN HFA) 108 (90 Base) MCG/ACT inhaler INHALE 2 PUFFS INTO LUNGS EVERY 6 HOURS AS NEEDED FOR WHEEZING OR SHORTNESS OF BREATH   Blood Glucose Monitoring Suppl (FREESTYLE LITE) w/Device KIT 1 each by Does not apply route as directed.  DX E11.9   fluticasone furoate-vilanterol (BREO ELLIPTA) 100-25 MCG/ACT AEPB INHALE 1 PUFF INTO THE LUNGS DAILY   glipiZIDE (GLUCOTROL XL) 10 MG 24 hr tablet Take 1 tablet (10 mg total) by mouth daily with breakfast. for diabetes.   glucose blood test strip Use to test blood sugars up to three times a day Dx E11.9   hydrochlorothiazide (HYDRODIURIL) 25 MG tablet Take 1 tablet (25 mg total) by mouth daily. For blood pressure.   Lancets (FREESTYLE) lancets 1 each 3 (three) times  daily.   metFORMIN (GLUCOPHAGE) 1000 MG tablet TAKE 1 TABLET BY MOUTH TWICE A DAY FOR DIABETES   montelukast (SINGULAIR) 10 MG tablet Take 1 tablet (10 mg total) by mouth at bedtime. For allergies and asthma   olmesartan (BENICAR) 40 MG tablet Take 1 tablet (40 mg total) by mouth daily. for blood pressure.   ondansetron (ZOFRAN) 4 MG tablet Take 1 tablet (4 mg total) by mouth every 8 (eight) hours as needed for nausea or vomiting.   pantoprazole (PROTONIX) 40 MG tablet Take 1 tablet (40 mg total) by mouth daily.   pravastatin (PRAVACHOL) 40 MG tablet Take 1 tablet (40 mg total) by mouth daily. For cholesterol   Semaglutide,0.25 or 0.5MG/DOS, (OZEMPIC, 0.25 OR 0.5 MG/DOSE,) 2 MG/1.5ML SOPN Inject 0.25 mg into the skin once weekly for 4 weeks, increase to 0.5 mg weekly thereafter for diabetes.   No facility-administered encounter medications on file as of 10/20/2021.     REVIEW OF SYSTEMS  : All other systems reviewed and negative except where noted in the History of Present Illness.   PHYSICAL EXAM: BP 140/80   Pulse 82   Ht _0  (1.626 m)   Wt 181 lb (82.1 kg)   LMP  (LMP Unknown)   BMI 31.07 kg/m  General: Well developed white female in no acute distress Head: Normocephalic and atraumatic Eyes:  Sclerae anicteric, conjunctiva pink. Ears: Normal auditory acuity  Lungs: Clear throughout to auscultation; B/L wheezing noted. Heart: Regular rate and rhythm; no M/R/G. Abdomen: Soft, epigastric TTP.  BS present.  Non-tender. Rectal: Will be done at the time of colonoscopy. Musculoskeletal: Symmetrical with no gross deformities  Skin: No lesions on visible extremities Extremities: No edema  Neurological: Alert oriented x 4, grossly non-focal Psychological:  Alert and cooperative. Normal mood and affect  ASSESSMENT AND PLAN: *Cirrhosis: New diagnosis, suspected due to alcohol.  Has been a regular drinker for quite some time.  Has been trying to cut down as of late since her diagnosis.   We will check all of the routine liver serologies to rule out other coexisting causes of liver disease.  Reports mild swelling in her lower extremities as she stands on her feet all day working.  Is on a low-dose hydrochlorothiazide.  We also discussed a 2 g sodium diet.  She will continue to decrease and hopefully completely discontinue alcohol altogether.  Advised to begin taking a daily folic acid and thiamine supplements over-the-counter.  We will check an AFP level and a PT/INR as well.  Has signs of portal hypertension on CT scan.  We will schedule for EGD for esophageal varices screening with Dr. [facility]so Courier at [facility]. *Epigastric abdominal pain with esophageal wall thickening seen on CT scan.  Could represent esophagitis.  Is on pantoprazole 40 mg daily, will increase to twice daily.  New prescription sent to pharmacy. *CRC screening:  Never had colonoscopy in the past.  Will schedule this with Dr. Lorenso Courier as well.   **  The risks, benefits, and alternatives to EGD and colonoscpy were discussed with the patient and she consents to proceed.     CC:  Eugenia Pancoast, FNP

## 2021-10-20 NOTE — Patient Instructions (Addendum)
Your provider has requested that you go to the basement level for lab work before leaving today. Press "B" on the elevator. The lab is located at the first door on the left as you exit the elevator.  We have sent the following medications to your pharmacy for you to pick up at your convenience: Pantoprazole 40 mg twice daily 30-60 minutes before breakfast and dinner.  Start a 2 gm Sodium diet.  Continue to work on decreasing alcohol intake.  Start Folic Acid OTC and Thiamine.   If you are age 16 or older, your body mass index should be between 23-30. Your Body mass index is 31.07 kg/m. If this is out of the aforementioned range listed, please consider follow up with your Primary Care Provider.  If you are age 74 or younger, your body mass index should be between 19-25. Your Body mass index is 31.07 kg/m. If this is out of the aformentioned range listed, please consider follow up with your Primary Care Provider.   ________________________________________________________  The [facility] providers would like to encourage you to use [facility] to communicate with providers for non-urgent requests or questions.  Due to long hold times on the telephone, sending your provider a message by [facility] may be a faster and more efficient way to get a response.  Please allow 48 business hours for a response.  Please remember that this is for non-urgent requests.  _______________________________________________________  Thank you for entrusting me with your care and for choosing [employer],  Alonza Bogus, P.A. - C.

## 2021-10-20 NOTE — Progress Notes (Signed)
I agree with the assessment and plan as outlined by Alexandria Leblanc. 

## 2021-10-22 ENCOUNTER — Ambulatory Visit (HOSPITAL_COMMUNITY)
Admission: RE | Admit: 2021-10-22 | Discharge: 2021-10-22 | Disposition: A | Discharge status: Home / Self Care | Payer: Managed Care, Other (non HMO) | Attending: Internal Medicine | Admitting: Internal Medicine

## 2021-10-22 ENCOUNTER — Other Ambulatory Visit: Payer: Self-pay

## 2021-10-22 ENCOUNTER — Ambulatory Visit (HOSPITAL_COMMUNITY): Payer: Managed Care, Other (non HMO) | Admitting: Anesthesiology

## 2021-10-22 ENCOUNTER — Ambulatory Visit (HOSPITAL_BASED_OUTPATIENT_CLINIC_OR_DEPARTMENT_OTHER): Payer: Managed Care, Other (non HMO) | Admitting: Anesthesiology

## 2021-10-22 ENCOUNTER — Encounter (HOSPITAL_COMMUNITY)
Admission: RE | Disposition: A | Discharge status: Home / Self Care | Payer: Self-pay | Source: Home / Self Care | Attending: Internal Medicine

## 2021-10-22 ENCOUNTER — Other Ambulatory Visit: Payer: Self-pay | Admitting: Primary Care

## 2021-10-22 ENCOUNTER — Encounter (HOSPITAL_COMMUNITY): Payer: Self-pay | Admitting: Internal Medicine

## 2021-10-22 DIAGNOSIS — K766 Portal hypertension: Secondary | ICD-10-CM | POA: Insufficient documentation

## 2021-10-22 DIAGNOSIS — A630 Anogenital (venereal) warts: Secondary | ICD-10-CM | POA: Diagnosis not present

## 2021-10-22 DIAGNOSIS — Z1211 Encounter for screening for malignant neoplasm of colon: Secondary | ICD-10-CM | POA: Diagnosis present

## 2021-10-22 DIAGNOSIS — K219 Gastro-esophageal reflux disease without esophagitis: Secondary | ICD-10-CM | POA: Diagnosis not present

## 2021-10-22 DIAGNOSIS — K7460 Unspecified cirrhosis of liver: Secondary | ICD-10-CM | POA: Insufficient documentation

## 2021-10-22 DIAGNOSIS — E119 Type 2 diabetes mellitus without complications: Secondary | ICD-10-CM | POA: Insufficient documentation

## 2021-10-22 DIAGNOSIS — K3189 Other diseases of stomach and duodenum: Secondary | ICD-10-CM | POA: Diagnosis not present

## 2021-10-22 DIAGNOSIS — Z800 Family history of malignant neoplasm of digestive organs: Secondary | ICD-10-CM | POA: Insufficient documentation

## 2021-10-22 DIAGNOSIS — I10 Essential (primary) hypertension: Secondary | ICD-10-CM | POA: Insufficient documentation

## 2021-10-22 DIAGNOSIS — Z7985 Long-term (current) use of injectable non-insulin antidiabetic drugs: Secondary | ICD-10-CM | POA: Diagnosis not present

## 2021-10-22 DIAGNOSIS — Z7984 Long term (current) use of oral hypoglycemic drugs: Secondary | ICD-10-CM | POA: Diagnosis not present

## 2021-10-22 DIAGNOSIS — Z79899 Other long term (current) drug therapy: Secondary | ICD-10-CM | POA: Diagnosis not present

## 2021-10-22 DIAGNOSIS — F17210 Nicotine dependence, cigarettes, uncomplicated: Secondary | ICD-10-CM | POA: Insufficient documentation

## 2021-10-22 DIAGNOSIS — K648 Other hemorrhoids: Secondary | ICD-10-CM | POA: Diagnosis not present

## 2021-10-22 DIAGNOSIS — R8589 Other abnormal findings in specimens from digestive organs and abdominal cavity: Secondary | ICD-10-CM | POA: Insufficient documentation

## 2021-10-22 DIAGNOSIS — R933 Abnormal findings on diagnostic imaging of other parts of digestive tract: Secondary | ICD-10-CM

## 2021-10-22 DIAGNOSIS — K635 Polyp of colon: Secondary | ICD-10-CM | POA: Diagnosis not present

## 2021-10-22 DIAGNOSIS — R1013 Epigastric pain: Secondary | ICD-10-CM

## 2021-10-22 HISTORY — PX: COLONOSCOPY WITH PROPOFOL: SHX5780

## 2021-10-22 HISTORY — PX: POLYPECTOMY: SHX5525

## 2021-10-22 HISTORY — PX: BIOPSY: SHX5522

## 2021-10-22 HISTORY — PX: ESOPHAGOGASTRODUODENOSCOPY (EGD) WITH PROPOFOL: SHX5813

## 2021-10-22 LAB — GLUCOSE, CAPILLARY: Glucose-Capillary: 162 mg/dL — ABNORMAL HIGH (ref 70–99)

## 2021-10-22 SURGERY — COLONOSCOPY WITH PROPOFOL
Anesthesia: Monitor Anesthesia Care

## 2021-10-22 MED ORDER — PROPOFOL 500 MG/50ML IV EMUL
INTRAVENOUS | Status: DC | PRN
  Administered 2021-10-22: 150 ug/kg/min via INTRAVENOUS

## 2021-10-22 MED ORDER — LACTATED RINGERS IV SOLN: INTRAVENOUS | Status: DC | PRN

## 2021-10-22 MED ORDER — SODIUM CHLORIDE 0.9 % IV SOLN: INTRAVENOUS | Status: DC

## 2021-10-22 MED ORDER — PROPOFOL 10 MG/ML IV BOLUS
INTRAVENOUS | Status: DC | PRN
  Administered 2021-10-22: 50 mg via INTRAVENOUS

## 2021-10-22 MED ORDER — GLYCOPYRROLATE PF 0.2 MG/ML IJ SOSY
PREFILLED_SYRINGE | INTRAMUSCULAR | Status: DC | PRN
  Administered 2021-10-22: .2 mg via INTRAVENOUS

## 2021-10-22 MED ORDER — PANTOPRAZOLE SODIUM 40 MG PO TBEC: 40.0000 mg | DELAYED_RELEASE_TABLET | Freq: Two times a day (BID) | ORAL | 1 refills | Status: DC

## 2021-10-22 MED ORDER — LIDOCAINE 2% (20 MG/ML) 5 ML SYRINGE
INTRAMUSCULAR | Status: DC | PRN
  Administered 2021-10-22: 40 mg via INTRAVENOUS

## 2021-10-22 SURGICAL SUPPLY — 25 items

## 2021-10-22 NOTE — Op Note (Signed)
[facility] Patient Name: Alexandria Leblanc Procedure Date: 10/22/2021 MRN: [id number] Attending MD: Georgian Co ,  Date of Birth: July 22, 1970 CSN: [id number] Age: 51 Admit Type: Outpatient Procedure:                Colonoscopy Indications:              Screening for colorectal malignant neoplasm Providers:                Adline Mango" Remus Blake, RN, Despina Pole, Technician, Margurite Auerbach Referring MD:             Pleas Koch Medicines:                Monitored Anesthesia Care Complications:            No immediate complications. Estimated Blood Loss:     Estimated blood loss was minimal. Procedure:                Pre-Anesthesia Assessment:                           - Prior to the procedure, a History and Physical                            was performed, and patient medications and                            allergies were reviewed. The patient's tolerance of                            previous anesthesia was also reviewed. The risks                            and benefits of the procedure and the sedation                            options and risks were discussed with the patient.                            All questions were answered, and informed consent                            was obtained. Prior Anticoagulants: The patient has                            taken no previous anticoagulant or antiplatelet                            agents. ASA Grade Assessment: III - A patient with                            severe systemic disease. After reviewing the risks  and benefits, the patient was deemed in                            satisfactory condition to undergo the procedure.                           After obtaining informed consent, the colonoscope                            was passed under direct vision. Throughout the                            procedure, the patient's blood pressure,  pulse, and                            oxygen saturations were monitored continuously. The                            CF-HQ190L ([id number]) Olympus colonoscope was                            introduced through the anus and advanced to the the                            terminal ileum. The colonoscopy was performed                            without difficulty. The patient tolerated the                            procedure well. The quality of the bowel                            preparation was adequate. The terminal ileum,                            ileocecal valve, appendiceal orifice, and rectum                            were photographed. Scope In: 10:35:56 AM Scope Out: 10:59:47 AM Scope Withdrawal Time: 0 hours 18 minutes 54 seconds  Total Procedure Duration: 0 hours 23 minutes 51 seconds  Findings:      The terminal ileum appeared normal.      A diffuse area of mildly erythematous mucosa was found in the entire       colon. This was biopsied with a cold forceps for histology.      A 2 mm polyp was found in the cecum. The polyp was sessile. The polyp       was removed with a cold biopsy forceps. Resection and retrieval were       complete.      Two sessile polyps were found in the sigmoid colon. The polyps were 4 to       5 mm in size. These polyps were removed with a cold snare. Resection and       retrieval were  complete.      Three sessile polyps were found in the sigmoid colon. The polyps were 1       to 2 mm in size. These polyps were removed with a cold biopsy forceps.       Resection and retrieval were complete.      Non-bleeding internal hemorrhoids were found during retroflexion.      The perianal exam findings include perianal condylomata. Impression:               - The examined portion of the ileum was normal.                           - Erythematous mucosa in the entire examined colon.                            Biopsied.                           - One 2 mm polyp in  the cecum, removed with a cold                            biopsy forceps. Resected and retrieved.                           - Two 4 to 5 mm polyps in the sigmoid colon,                            removed with a cold snare. Resected and retrieved.                           - Three 1 to 2 mm polyps in the sigmoid colon,                            removed with a cold biopsy forceps. Resected and                            retrieved.                           - Non-bleeding internal hemorrhoids.                           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 Moderate Sedation:      Not Applicable - Patient had care per Anesthesia. Recommendation:           - Discharge patient to home (with escort).                           - Await pathology results.                           - Return to GI clinic in 2 months.                           - Refer to a surgeon at appointment to be scheduled  to be treated for the perianal condylomata.                           - The findings and recommendations were discussed                            with the patient. Procedure Code(s):        --- Professional ---                           [id number], Colonoscopy, flexible; with removal of                            tumor(s), polyp(s), or other lesion(s) by snare                            technique                           45380, 71, Colonoscopy, flexible; with biopsy,                            single or multiple Diagnosis Code(s):        --- Professional ---                           K63.5, Polyp of colon                           Z12.11, Encounter for screening for malignant                            neoplasm of colon                           K64.8, Other hemorrhoids                           K63.89, Other specified diseases of intestine                           A63.0, Anogenital (venereal) warts CPT copyright 2019 American Medical Association. All rights reserved. The  codes documented in this report are preliminary and upon coder review may  be revised to meet current compliance requirements. Alexandria Leblanc "[name],  10/22/2021 11:12:55 AM Number of Addenda: 0

## 2021-10-22 NOTE — Discharge Instructions (Addendum)
YOU HAD AN ENDOSCOPIC PROCEDURE TODAY: Refer to the procedure report and other information in the discharge instructions given to you for any specific questions about what was found during the examination. If this information does not answer your questions, please call [place] office at [phone] to clarify.  ° °YOU SHOULD EXPECT: Some feelings of bloating in the abdomen. Passage of more gas than usual. Walking can help get rid of the air that was put into your GI tract during the procedure and reduce the bloating. If you had a lower endoscopy (such as a colonoscopy or flexible sigmoidoscopy) you may notice spotting of blood in your stool or on the toilet paper. Some abdominal soreness may be present for a day or two, also. ° °DIET: Your first meal following the procedure should be a light meal and then it is ok to progress to your normal diet. A half-sandwich or bowl of soup is an example of a good first meal. Heavy or fried foods are harder to digest and may make you feel nauseous or bloated. Drink plenty of fluids but you should avoid alcoholic beverages for 24 hours. If you had a esophageal dilation, please see attached instructions for diet.   ° °ACTIVITY: Your care partner should take you home directly after the procedure. You should plan to take it easy, moving slowly for the rest of the day. You can resume normal activity the day after the procedure however YOU SHOULD NOT DRIVE, use power tools, machinery or perform tasks that involve climbing or major physical exertion for 24 hours (because of the sedation medicines used during the test).  ° °SYMPTOMS TO REPORT IMMEDIATELY: °A gastroenterologist can be reached at any hour. Please call [phone]  for any of the following symptoms:  °Following lower endoscopy (colonoscopy, flexible sigmoidoscopy) °Excessive amounts of blood in the stool  °Significant tenderness, worsening of abdominal pains  °Swelling of the abdomen that is new, acute  °Fever of 100° or  higher  °Following upper endoscopy (EGD, EUS, ERCP, esophageal dilation) °Vomiting of blood or coffee ground material  °New, significant abdominal pain  °New, significant chest pain or pain under the shoulder blades  °Painful or persistently difficult swallowing  °New shortness of breath  °Black, tarry-looking or red, bloody stools ° °FOLLOW UP:  °If any biopsies were taken you will be contacted by phone or by letter within the next 1-3 weeks. Call [phone]  if you have not heard about the biopsies in 3 weeks.  °Please also call with any specific questions about appointments or follow up tests. ° °

## 2021-10-22 NOTE — H&P (Signed)
GASTROENTEROLOGY PROCEDURE H&P NOTE   Primary Care Physician: Pleas Koch, NP    Reason for Procedure:   Colon cancer screening, variceal screening, esophageal thickening on CT scan  Plan:    EGD/colonoscopy  Patient is appropriate for endoscopic procedure(s) in the hospital setting.  The nature of the procedure, as well as the risks, benefits, and alternatives were carefully and thoroughly reviewed with the patient. Ample time for discussion and questions allowed. The patient understood, was satisfied, and agreed to proceed.     HPI: Alexandria Leblanc is a 51 y.o. female who presents for EGD/colonoscopy for evaluation of EV screening, esophageal thickening on CT scan, colon cancer screening .  Patient was most recently seen in the Gastroenterology Clinic on 10/20/21.  No interval change in medical history since that appointment. Please refer to that note for full details regarding GI history and clinical presentation.   Past Medical History:  Diagnosis Date   Acute non-recurrent sinusitis 07/06/2021   Alcohol abuse    Anxiety    Arthritis    Asthma    Depression    Gallstones    Genital warts    GERD (gastroesophageal reflux disease)    Hypertension    Migraines    Obesity    Type 2 diabetes mellitus ([place])     History reviewed. No pertinent surgical history.  Prior to Admission medications   Medication Sig Start Date End Date Taking? Authorizing Provider  acyclovir (ZOVIRAX) 400 MG tablet Take 1 tablet (400 mg total) by mouth 2 (two) times daily. For herpes prevention. Office visit required for further refills. 08/18/21  Yes Pleas Koch, NP  albuterol (VENTOLIN HFA) 108 (90 Base) MCG/ACT inhaler INHALE 2 PUFFS INTO LUNGS EVERY 6 HOURS AS NEEDED FOR WHEEZING OR SHORTNESS OF BREATH 08/25/21  Yes Pleas Koch, NP  glipiZIDE (GLUCOTROL XL) 10 MG 24 hr tablet Take 1 tablet (10 mg total) by mouth daily with breakfast. for diabetes. 09/14/21  Yes Pleas Koch, NP  hydrochlorothiazide (HYDRODIURIL) 25 MG tablet Take 1 tablet (25 mg total) by mouth daily. For blood pressure. 11/12/20  Yes Pleas Koch, NP  metFORMIN (GLUCOPHAGE) 1000 MG tablet TAKE 1 TABLET BY MOUTH TWICE A DAY FOR DIABETES 09/09/21  Yes Pleas Koch, NP  olmesartan (BENICAR) 40 MG tablet Take 1 tablet (40 mg total) by mouth daily. for blood pressure. 09/08/21  Yes Pleas Koch, NP  pantoprazole (PROTONIX) 40 MG tablet Take 1 tablet (40 mg total) by mouth 2 (two) times daily before a meal. 10/20/21 11/19/21 Yes Zehr, Laban Emperor, PA-C  pravastatin (PRAVACHOL) 40 MG tablet Take 1 tablet (40 mg total) by mouth daily. For cholesterol 03/15/21  Yes Pleas Koch, NP  Blood Glucose Monitoring Suppl (FREESTYLE LITE) w/Device KIT 1 each by Does not apply route as directed. DX E11.9 08/11/20   Pleas Koch, NP  glucose blood test strip Use to test blood sugars up to three times a day Dx E11.9 08/11/20   Pleas Koch, NP  Lancets (FREESTYLE) lancets 1 each 3 (three) times daily. 08/11/20   [provider]  montelukast (SINGULAIR) 10 MG tablet Take 1 tablet (10 mg total) by mouth at bedtime. For allergies and asthma 09/08/21   Pleas Koch, NP  ondansetron (ZOFRAN) 4 MG tablet Take 1 tablet (4 mg total) by mouth every 8 (eight) hours as needed for nausea or vomiting. 09/29/21   Eugenia Pancoast, FNP  Semaglutide,0.25 or 0.5MG/DOS, (  OZEMPIC, 0.25 OR 0.5 MG/DOSE,) 2 MG/1.5ML SOPN Inject 0.25 mg into the skin once weekly for 4 weeks, increase to 0.5 mg weekly thereafter for diabetes. Patient not taking: Reported on 10/21/2021 09/08/21   Pleas Koch, NP    Current Facility-Administered Medications  Medication Dose Route Frequency Provider Last Rate Last Admin   0.9 %  sodium chloride infusion   Intravenous Continuous Zehr, Jessica D, PA-C        Allergies as of 10/20/2021 - Review Complete 10/20/2021  Allergen Reaction Noted   Penicillins Hives  and Itching 02/05/2015   Sulfa antibiotics Hives 02/05/2015    Family History  Problem Relation Age of Onset   Heart disease Mother    Hypertension Mother    Dementia Mother    Diabetes Mother    COPD Father    Hypertension Father    Alzheimer's disease Sister    Hyperlipidemia Brother    Diabetes Brother    Prostate cancer Paternal Uncle    Colon cancer Paternal Uncle     Social History   Socioeconomic History   Marital status: Single    Spouse name: Not on file   Number of children: 0   Years of education: Not on file   Highest education level: Not on file  Occupational History   Occupation: Scientist, water quality  Tobacco Use   Smoking status: Every Day    Packs/day: 1.00    Years: 35.00    Pack years: 35.00    Types: Cigarettes   Smokeless tobacco: Never  Vaping Use   Vaping Use: Never used  Substance and Sexual Activity   Alcohol use: Yes    Alcohol/week: 35.0 standard drinks    Types: 35 Cans of beer per week   Drug use: No   Sexual activity: Not on file  Other Topics Concern   Not on file  Social History Narrative   Single; No children.; 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 1and half a day- smoking; 5 beers a week; no hard liqor.           Social Determinants of Health   Financial Resource Strain: Not on file  Food Insecurity: Not on file  Transportation Needs: Not on file  Physical Activity: Not on file  Stress: Not on file  Social Connections: Not on file  Intimate Partner Violence: Not on file    Physical Exam: Vital signs in last 24 hours: BP (!) 153/88   Pulse 84   Temp (!) 97.2 F (36.2 C) (Temporal)   Resp 15   LMP  (LMP Unknown)   SpO2 98%  GEN: NAD EYE: Sclerae anicteric ENT: MMM CV: Non-tachycardic Pulm: No increased WOB GI: Soft NEURO:  Alert & Oriented   [name], MD [facility]   10/22/2021 9:20 AM

## 2021-10-22 NOTE — Op Note (Signed)
[facility] Patient Name: Alexandria Leblanc Procedure Date: 10/22/2021 MRN: [id number] Attending MD: Georgian Co ,  Date of Birth: [birth year] CSN: [id number] Age: 51 Admit Type: Outpatient Procedure:                Upper GI endoscopy Indications:              Cirrhosis rule out esophageal varices, abnormal                            thickening on CT scan Providers:                Adline Mango" Remus Blake, RN, Despina Pole, Technician, Margurite Auerbach Referring MD:             Pleas Koch Medicines:                Monitored Anesthesia Care Complications:            No immediate complications. Estimated Blood Loss:     Estimated blood loss was minimal. Procedure:                Pre-Anesthesia Assessment:                           - Prior to the procedure, a History and Physical                            was performed, and patient medications and                            allergies were reviewed. The patient's tolerance of                            previous anesthesia was also reviewed. The risks                            and benefits of the procedure and the sedation                            options and risks were discussed with the patient.                            All questions were answered, and informed consent                            was obtained. Prior Anticoagulants: The patient has                            taken no previous anticoagulant or antiplatelet                            agents. ASA Grade Assessment: III - A patient with  severe systemic disease. After reviewing the risks                            and benefits, the patient was deemed in                            satisfactory condition to undergo the procedure.                           After obtaining informed consent, the endoscope was                            passed under direct vision. Throughout the                             procedure, the patient's blood pressure, pulse, and                            oxygen saturations were monitored continuously. The                            [id number]-H190 ([id number]) Olympus endoscope was introduced                            through the mouth, and advanced to the second part                            of duodenum. The upper GI endoscopy was                            accomplished without difficulty. The patient                            tolerated the procedure well. Scope In: Scope Out: Findings:      The examined esophagus was normal. Biopsies were taken with a cold       forceps for histology.      Mild portal hypertensive gastropathy was found in the gastric fundus and       in the gastric body. There was some overlying hematin over this area.      Localized erythematous mucosa without bleeding was found in the gastric       antrum. Biopsies were taken with a cold forceps for histology.      Localized nodular mucosa was found in the duodenal bulb. Biopsies were       taken with a cold forceps for histology. Impression:               - Normal esophagus. Biopsied.                           - Portal hypertensive gastropathy.                           - Erythematous mucosa in the antrum. Biopsied.                           -  Nodular mucosa in the duodenal bulb. Biopsied. Moderate Sedation:      Not Applicable - Patient had care per Anesthesia. Recommendation:           - Use Prilosec (omeprazole) 40 mg PO BID for 8                            weeks.                           - Perform a colonoscopy today. Procedure Code(s):        --- Professional ---                           [phone], Esophagogastroduodenoscopy, flexible,                            transoral; with biopsy, single or multiple Diagnosis Code(s):        --- Professional ---                           K76.6, Portal hypertension                           K31.89, Other diseases of stomach and  duodenum                           K74.60, Unspecified cirrhosis of liver CPT copyright 2019 American Medical Association. All rights reserved. The codes documented in this report are preliminary and upon coder review may  be revised to meet current compliance requirements. Alexandria Leblanc "Alexandria Leblanc,  10/22/2021 11:06:16 AM Number of Addenda: 0

## 2021-10-22 NOTE — Anesthesia Postprocedure Evaluation (Signed)
Anesthesia Post Note  Patient: Alexandria Leblanc  Procedure(s) Performed: COLONOSCOPY WITH PROPOFOL ESOPHAGOGASTRODUODENOSCOPY (EGD) WITH PROPOFOL BIOPSY POLYPECTOMY     Patient location during evaluation: PACU Anesthesia Type: MAC Level of consciousness: awake and alert Pain management: pain level controlled Vital Signs Assessment: post-procedure vital signs reviewed and stable Respiratory status: spontaneous breathing, nonlabored ventilation, respiratory function stable and patient connected to nasal cannula oxygen Cardiovascular status: stable and blood pressure returned to baseline Postop Assessment: no apparent nausea or vomiting Anesthetic complications: no   No notable events documented.  Last Vitals:  Vitals:   10/22/21 1110 10/22/21 1112  BP: 127/79 127/79  Pulse: 84 88  Resp: 16 (!) 23  Temp:  36.7 C  SpO2: 99% 98%    Last Pain:  Vitals:   10/22/21 1112  TempSrc: Temporal  PainSc: 0-No pain                 [name] S

## 2021-10-22 NOTE — Transfer of Care (Signed)
Immediate Anesthesia Transfer of Care Note  Patient: Alexandria Leblanc  Procedure(s) Performed: COLONOSCOPY WITH PROPOFOL ESOPHAGOGASTRODUODENOSCOPY (EGD) WITH PROPOFOL BIOPSY POLYPECTOMY  Patient Location: Endo PACU  Anesthesia Type:MAC  Level of Consciousness: awake, alert  and oriented  Airway & Oxygen Therapy: Patient Spontanous Breathing and Patient connected to face mask  Post-op Assessment: Report given to RN and Post -op Vital signs reviewed and stable  Post vital signs: Reviewed and stable  Last Vitals:  Vitals Value Taken Time  BP 138/74 10/22/21 1105  Temp 36.3 C 10/22/21 1105  Pulse 80 10/22/21 1105  Resp 18 10/22/21 1105  SpO2 100 % 10/22/21 1105    Last Pain:  Vitals:   10/22/21 1105  TempSrc: Temporal  PainSc: 0-No pain      Patients Stated Pain Goal: 0 (03/31/27 1188)  Complications: No notable events documented.

## 2021-10-22 NOTE — Anesthesia Preprocedure Evaluation (Addendum)
Anesthesia Evaluation  Patient identified by MRN, date of birth, ID band Patient awake    Reviewed: Allergy & Precautions, NPO status , Patient's Chart, lab work & pertinent test results  Airway Mallampati: II  TM Distance: >3 FB Neck ROM: Full    Dental no notable dental hx.    Pulmonary Current Smoker,    Pulmonary exam normal breath sounds clear to auscultation       Cardiovascular hypertension, Normal cardiovascular exam Rhythm:Regular Rate:Normal     Neuro/Psych negative neurological ROS  negative psych ROS   GI/Hepatic GERD  Medicated,(+) Cirrhosis     substance abuse  alcohol use, Portal HTN   Endo/Other  diabetes  Renal/GU negative Renal ROS  negative genitourinary   Musculoskeletal negative musculoskeletal ROS (+)   Abdominal   Peds negative pediatric ROS (+)  Hematology negative hematology ROS (+)   Anesthesia Other Findings   Reproductive/Obstetrics negative OB ROS                            Anesthesia Physical Anesthesia Plan  ASA: 3  Anesthesia Plan: MAC   Post-op Pain Management: Minimal or no pain anticipated   Induction: Intravenous  PONV Risk Score and Plan: 2 and Propofol infusion and Treatment may vary due to age or medical condition  Airway Management Planned: Simple Face Mask  Additional Equipment:   Intra-op Plan:   Post-operative Plan:   Informed Consent: I have reviewed the patients History and Physical, chart, labs and discussed the procedure including the risks, benefits and alternatives for the proposed anesthesia with the patient or authorized representative who has indicated his/her understanding and acceptance.     Dental advisory given  Plan Discussed with: CRNA and Surgeon  Anesthesia Plan Comments:         Anesthesia Quick Evaluation

## 2021-10-23 ENCOUNTER — Encounter: Payer: Self-pay | Admitting: Internal Medicine

## 2021-10-23 LAB — SURGICAL PATHOLOGY

## 2021-10-23 NOTE — Progress Notes (Signed)
Stable small scattered pulmonary nodules Calcifications in coronary arteries (these build up over time with plaquing form cholesterol and long standing high blood pressure ) it is important to keep blood pressure and cholesterol under control.   Again seen, hepatic cirrhosis f/u with GI as scheduled.  Gallstones, pretty extensive.  Right kidney density, likely cyst. Stable per radiologist.  Lymph nodes stable near liver.  Thickening of esophagitis, also f/u with gi. Could be from heart burn irritating esophagus over time or smoking  Recommend non contrast chest ct in 12 months to monitor pulmonary nodules, pt to f/u with pcp in regards to this.

## 2021-10-23 NOTE — Progress Notes (Signed)
Recall placed per Dr. Dr. Libby Maw request. Letter sent via My Chart and mailed to home address.

## 2021-10-24 LAB — HEPATITIS A ANTIBODY, TOTAL: Hepatitis A AB,Total: REACTIVE — AB

## 2021-10-24 LAB — AFP TUMOR MARKER: AFP-Tumor Marker: 3 ng/mL

## 2021-10-24 LAB — HEPATITIS B SURFACE ANTIGEN: Hepatitis B Surface Ag: NONREACTIVE

## 2021-10-24 LAB — ANTI-SMOOTH MUSCLE ANTIBODY, IGG: Actin (Smooth Muscle) Antibody (IGG): <20 U (ref <20)

## 2021-10-24 LAB — HEPATITIS C ANTIBODY
Hepatitis C Ab: NONREACTIVE
SIGNAL TO CUT-OFF: 0.13 (ref <1.00)

## 2021-10-24 LAB — MITOCHONDRIAL ANTIBODIES: Mitochondrial M2 Ab, IgG: <=20 U (ref <=20.0)

## 2021-10-24 LAB — ANTI-NUCLEAR AB-TITER (ANA TITER)
ANA TITER: 1:160 {titer} — ABNORMAL HIGH
ANA Titer 1: 1:80 {titer} — ABNORMAL HIGH

## 2021-10-24 LAB — ANA: Anti Nuclear Antibody (ANA): POSITIVE — AB

## 2021-10-24 LAB — ALPHA-1-ANTITRYPSIN: A-1 Antitrypsin, Ser: 164 mg/dL (ref 83–199)

## 2021-10-24 LAB — IGG: IgG (Immunoglobin G), Serum: 1189 mg/dL (ref 600–1640)

## 2021-10-24 LAB — HEPATITIS B SURFACE ANTIBODY,QUALITATIVE: Hep B S Ab: NONREACTIVE

## 2021-10-24 LAB — CERULOPLASMIN: Ceruloplasmin: 33 mg/dL (ref 18–53)

## 2021-11-04 ENCOUNTER — Other Ambulatory Visit: Payer: Self-pay

## 2021-11-04 DIAGNOSIS — K7460 Unspecified cirrhosis of liver: Secondary | ICD-10-CM

## 2021-11-05 ENCOUNTER — Other Ambulatory Visit: Payer: Self-pay

## 2021-11-05 DIAGNOSIS — D696 Thrombocytopenia, unspecified: Secondary | ICD-10-CM

## 2021-11-12 ENCOUNTER — Ambulatory Visit (INDEPENDENT_AMBULATORY_CARE_PROVIDER_SITE_OTHER): Payer: Managed Care, Other (non HMO) | Admitting: Gastroenterology

## 2021-11-12 DIAGNOSIS — Z23 Encounter for immunization: Secondary | ICD-10-CM | POA: Diagnosis not present

## 2021-11-12 DIAGNOSIS — K7460 Unspecified cirrhosis of liver: Secondary | ICD-10-CM | POA: Diagnosis not present

## 2021-11-14 ENCOUNTER — Other Ambulatory Visit: Payer: Self-pay | Admitting: Primary Care

## 2021-11-14 DIAGNOSIS — A6000 Herpesviral infection of urogenital system, unspecified: Secondary | ICD-10-CM

## 2021-11-18 ENCOUNTER — Inpatient Hospital Stay: Payer: Managed Care, Other (non HMO) | Admitting: Internal Medicine

## 2021-11-18 ENCOUNTER — Inpatient Hospital Stay: Payer: Managed Care, Other (non HMO) | Attending: Internal Medicine

## 2021-11-18 ENCOUNTER — Encounter: Payer: Self-pay | Admitting: Internal Medicine

## 2021-11-18 DIAGNOSIS — F17210 Nicotine dependence, cigarettes, uncomplicated: Secondary | ICD-10-CM | POA: Diagnosis not present

## 2021-11-18 DIAGNOSIS — R5383 Other fatigue: Secondary | ICD-10-CM | POA: Insufficient documentation

## 2021-11-18 DIAGNOSIS — E119 Type 2 diabetes mellitus without complications: Secondary | ICD-10-CM | POA: Insufficient documentation

## 2021-11-18 DIAGNOSIS — I10 Essential (primary) hypertension: Secondary | ICD-10-CM | POA: Insufficient documentation

## 2021-11-18 DIAGNOSIS — K7460 Unspecified cirrhosis of liver: Secondary | ICD-10-CM | POA: Insufficient documentation

## 2021-11-18 DIAGNOSIS — Z800 Family history of malignant neoplasm of digestive organs: Secondary | ICD-10-CM | POA: Diagnosis not present

## 2021-11-18 DIAGNOSIS — D696 Thrombocytopenia, unspecified: Secondary | ICD-10-CM | POA: Diagnosis not present

## 2021-11-18 DIAGNOSIS — Z8042 Family history of malignant neoplasm of prostate: Secondary | ICD-10-CM | POA: Insufficient documentation

## 2021-11-18 LAB — CBC WITH DIFFERENTIAL/PLATELET
Abs Immature Granulocytes: 0.02 10*3/uL (ref 0.00–0.07)
Basophils Absolute: 0 10*3/uL (ref 0.0–0.1)
Basophils Relative: 1 %
Eosinophils Absolute: 0.1 10*3/uL (ref 0.0–0.5)
Eosinophils Relative: 2 %
HCT: 37.8 % (ref 36.0–46.0)
Hemoglobin: 12.6 g/dL (ref 12.0–15.0)
Immature Granulocytes: 0 %
Lymphocytes Relative: 27 %
Lymphs Abs: 1.9 10*3/uL (ref 0.7–4.0)
MCH: 29.9 pg (ref 26.0–34.0)
MCHC: 33.3 g/dL (ref 30.0–36.0)
MCV: 89.6 fL (ref 80.0–100.0)
Monocytes Absolute: 0.7 10*3/uL (ref 0.1–1.0)
Monocytes Relative: 9 %
Neutro Abs: 4.4 10*3/uL (ref 1.7–7.7)
Neutrophils Relative %: 61 %
Platelets: 125 10*3/uL — ABNORMAL LOW (ref 150–400)
RBC: 4.22 MIL/uL (ref 3.87–5.11)
RDW: 13.5 % (ref 11.5–15.5)
WBC: 7.2 10*3/uL (ref 4.0–10.5)
nRBC: 0 % (ref 0.0–0.2)

## 2021-11-18 LAB — COMPREHENSIVE METABOLIC PANEL
ALT: 44 U/L (ref 0–44)
AST: 41 U/L (ref 15–41)
Albumin: 3.9 g/dL (ref 3.5–5.0)
Alkaline Phosphatase: 144 U/L — ABNORMAL HIGH (ref 38–126)
Anion gap: 10 (ref 5–15)
BUN: 12 mg/dL (ref 6–20)
CO2: 21 mmol/L — ABNORMAL LOW (ref 22–32)
Calcium: 9.1 mg/dL (ref 8.9–10.3)
Chloride: 99 mmol/L (ref 98–111)
Creatinine, Ser: 0.69 mg/dL (ref 0.44–1.00)
GFR, Estimated: >60 mL/min (ref >60)
Glucose, Bld: 267 mg/dL — ABNORMAL HIGH (ref 70–99)
Potassium: 3.9 mmol/L (ref 3.5–5.1)
Sodium: 130 mmol/L — ABNORMAL LOW (ref 135–145)
Total Bilirubin: 0.6 mg/dL (ref 0.3–1.2)
Total Protein: 8.5 g/dL — ABNORMAL HIGH (ref 6.5–8.1)

## 2021-11-18 NOTE — Assessment & Plan Note (Addendum)
#  Chronic intermittent mild thrombocytopenia > 100,000 [since 2016] asymptomatic.  Patient's platelet counts 120s sec to cirrhosis [see below].  Stable.  # Cirrhosis [MAY 2023-CT AP- NEG for lesion; GI [facility], [place]] s/p EGD-[place] [MAy 2023] discussed [place] surveillance.  # fatigue-alcohol/sleep apnea versus others.  Defer to PCP.   # DISPOSITION: # follow up in FEB 2024-MD; 1 week prior labs- cbc/cmp;PT/PTT; AFP; [place] abodmen -Dr.B  Cc; PCP

## 2021-11-18 NOTE — Progress Notes (Signed)
[facility] NOTE  Patient Care Team: Pleas Koch, NP as PCP - General (Internal Medicine) Cammie Sickle, MD as Consulting Physician (Internal Medicine)  CHIEF COMPLAINTS/PURPOSE OF CONSULTATION: Thrombocytopenia  HEMATOLOGY HISTORY  # THROMBOCYTOPENIA [since 2016] 110-170; normal white count hemoglobin; asymptomatic; hepatitis HIV-negative; LDH normal.  April 2021-ultrasound abdomen not done-financial reasons. MAY 2023- status post EGD/banding  #Diabetes/ alcohol abuse/smoker  HISTORY OF PRESENTING ILLNESS: Alone.  Ambulating independently.  Alexandria Leblanc 51 y.o.  female pleasant patient is here for follow-up for thrombocytopenia.  Patient states that she has cut down alcohol.  However she continues to drink.  Moderately.  Denies any bleeding.  No nausea no vomiting.  Chronic mild diarrhea.  Not any worse.  Continues to have fatigue.   Review of Systems  Constitutional:  Positive for malaise/fatigue. Negative for chills, diaphoresis, fever and weight loss.  HENT:  Negative for nosebleeds and sore throat.   Eyes:  Negative for double vision.  Respiratory:  Negative for cough, hemoptysis, sputum production, shortness of breath and wheezing.   Cardiovascular:  Negative for chest pain, palpitations, orthopnea and leg swelling.  Gastrointestinal:  Positive for diarrhea (metformin). Negative for abdominal pain, blood in stool, constipation, heartburn, melena, nausea and vomiting.  Genitourinary:  Negative for dysuria, frequency and urgency.  Musculoskeletal:  Negative for back pain and joint pain.  Skin: Negative.  Negative for itching and rash.  Neurological:  Positive for headaches. Negative for dizziness, tingling, focal weakness and weakness.  Endo/Heme/Allergies:  Does not bruise/bleed easily.  Psychiatric/Behavioral:  Negative for depression. The patient is not nervous/anxious and does not have insomnia.      MEDICAL HISTORY:  Past  Medical History:  Diagnosis Date   Acute non-recurrent sinusitis 07/06/2021   Alcohol abuse    Anxiety    Arthritis    Asthma    Depression    Gallstones    Genital warts    GERD (gastroesophageal reflux disease)    Hypertension    Migraines    Obesity    Type 2 diabetes mellitus ([place])     SURGICAL HISTORY: Past Surgical History:  Procedure Laterality Date   BIOPSY  10/22/2021   Procedure: BIOPSY;  Surgeon: Sharyn Creamer, MD;  Location: [facility];  Service: Gastroenterology;;  EGD and COLON   COLONOSCOPY WITH PROPOFOL N/A 10/22/2021   Procedure: COLONOSCOPY WITH PROPOFOL;  Surgeon: Sharyn Creamer, MD;  Location: [facility];  Service: Gastroenterology;  Laterality: N/A;   ESOPHAGOGASTRODUODENOSCOPY (EGD) WITH PROPOFOL N/A 10/22/2021   Procedure: ESOPHAGOGASTRODUODENOSCOPY (EGD) WITH PROPOFOL;  Surgeon: Sharyn Creamer, MD;  Location: [facility];  Service: Gastroenterology;  Laterality: N/A;   POLYPECTOMY  10/22/2021   Procedure: POLYPECTOMY;  Surgeon: Sharyn Creamer, MD;  Location: [facility];  Service: Gastroenterology;;    SOCIAL HISTORY: Social History   Socioeconomic History   Marital status: Single    Spouse name: Not on file   Number of children: 0   Years of education: Not on file   Highest education level: Not on file  Occupational History   Occupation: Scientist, water quality  Tobacco Use   Smoking status: Every Day    Packs/day: 1.00    Years: 35.00    Total pack years: 35.00    Types: Cigarettes   Smokeless tobacco: Never  Vaping Use   Vaping Use: Never used  Substance and Sexual Activity   Alcohol use: Yes    Alcohol/week: 35.0 standard drinks of alcohol    Types:  25 Cans of beer per week   Drug use: No   Sexual activity: Not on file  Other Topics Concern   Not on file  Social History Narrative   Single; No children.; 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 1and half a day- smoking; 5 beers a week; no hard  liqor.           Social Determinants of Health   Financial Resource Strain: Not on file  Food Insecurity: Not on file  Transportation Needs: Not on file  Physical Activity: Not on file  Stress: Not on file  Social Connections: Not on file  Intimate Partner Violence: Not on file    FAMILY HISTORY: Family History  Problem Relation Age of Onset   Heart disease Mother    Hypertension Mother    Dementia Mother    Diabetes Mother    COPD Father    Hypertension Father    Alzheimer's disease Sister    Hyperlipidemia Brother    Diabetes Brother    Prostate cancer Paternal Uncle    Colon cancer Paternal Uncle     ALLERGIES:  is allergic to penicillins and sulfa antibiotics.  MEDICATIONS:  Current Outpatient Medications  Medication Sig Dispense Refill   acyclovir (ZOVIRAX) 400 MG tablet TAKE 1 TABLET BY MOUTH TWICE A DAY FOR HERPES PREVENTION. OFFICE VISIT REQUIREDFOR FURTHER REFILLS 180 tablet 0   albuterol (VENTOLIN HFA) 108 (90 Base) MCG/ACT inhaler INHALE 2 PUFFS INTO LUNGS EVERY 6 HOURS AS NEEDED FOR WHEEZING OR SHORTNESS OF BREATH 6.7 g 0   Blood Glucose Monitoring Suppl (FREESTYLE LITE) w/Device KIT 1 each by Does not apply route as directed. DX E11.9 1 kit 0   glipiZIDE (GLUCOTROL XL) 10 MG 24 hr tablet Take 1 tablet (10 mg total) by mouth daily with breakfast. for diabetes. 90 tablet 1   glucose blood test strip Use to test blood sugars up to three times a day Dx E11.9 200 each 3   hydrochlorothiazide (HYDRODIURIL) 25 MG tablet TAKE 1 TABLET BY MOUTH ONCE A DAY AS NEEDED FOR BLOOD PRESSURE 90 tablet 2   Lancets (FREESTYLE) lancets 1 each 3 (three) times daily.     metFORMIN (GLUCOPHAGE) 1000 MG tablet TAKE 1 TABLET BY MOUTH TWICE A DAY FOR DIABETES 180 tablet 1   olmesartan (BENICAR) 40 MG tablet Take 1 tablet (40 mg total) by mouth daily. for blood pressure. 90 tablet 1   pantoprazole (PROTONIX) 40 MG tablet Take 1 tablet (40 mg total) by mouth 2 (two) times daily before a  meal. 60 tablet 1   pravastatin (PRAVACHOL) 40 MG tablet Take 1 tablet (40 mg total) by mouth daily. For cholesterol 90 tablet 2   ondansetron (ZOFRAN) 4 MG tablet Take 1 tablet (4 mg total) by mouth every 8 (eight) hours as needed for nausea or vomiting. (Patient not taking: Reported on 11/18/2021) 20 tablet 0   Semaglutide,0.25 or 0.5MG/DOS, (OZEMPIC, 0.25 OR 0.5 MG/DOSE,) 2 MG/1.5ML SOPN Inject 0.25 mg into the skin once weekly for 4 weeks, increase to 0.5 mg weekly thereafter for diabetes. (Patient not taking: Reported on 10/21/2021) 6 mL 1   No current facility-administered medications for this visit.     Marland Kitchen  PHYSICAL EXAMINATION:   Vitals:   11/18/21 1420  BP: (!) 142/77  Pulse: 79  Resp: 16  Temp: 98.1 F (36.7 C)  SpO2: 97%   Filed Weights   11/18/21 1420  Weight:  180 lb (81.6 kg)    Physical Exam HENT:     Head: Normocephalic and atraumatic.     Mouth/Throat:     Pharynx: No oropharyngeal exudate.  Eyes:     Pupils: Pupils are equal, round, and reactive to light.  Cardiovascular:     Rate and Rhythm: Normal rate and regular rhythm.  Pulmonary:     Effort: No respiratory distress.     Breath sounds: No wheezing.  Abdominal:     General: Bowel sounds are normal. There is no distension.     Palpations: Abdomen is soft. There is no mass.     Tenderness: There is no abdominal tenderness. There is no guarding or rebound.     Comments: Positive for hepatomegaly.  No splenomegaly.  Musculoskeletal:        General: No tenderness. Normal range of motion.     Cervical back: Normal range of motion and neck supple.  Skin:    General: Skin is warm.  Neurological:     Mental Status: She is alert and oriented to person, place, and time.  Psychiatric:        Mood and Affect: Affect normal.      LABORATORY DATA:  I have reviewed the data as listed Lab Results  Component Value Date   WBC 7.2 11/18/2021   HGB 12.6 11/18/2021   HCT 37.8 11/18/2021   MCV 89.6 11/18/2021    PLT 125 (L) 11/18/2021   Recent Labs    09/08/21 1029 09/29/21 0831 10/09/21 1200 11/18/21 1415  NA 134* 127* 133* 130*  K 4.7 4.2 4.2 3.9  CL 99 93* 97 99  CO2 _0 21*  GLUCOSE 199* 133* 121* 267*  BUN _1 CREATININE 0.48 0.92 0.58 0.69  CALCIUM 9.6 9.7 9.6 9.1  GFRNONAA  --   --   --  >60  PROT 7.6 8.2  --  8.5*  ALBUMIN 4.0 4.3  --  3.9  AST 36 30  --  41  ALT 33 24  --  44  ALKPHOS 141* 121*  --  144*  BILITOT 0.3 0.4  --  0.6     No results found.  ASSESSMENT & PLAN:   Thrombocytopenia ([place]) #Chronic intermittent mild thrombocytopenia > 100,000 [since 2016] asymptomatic.  Patient's platelet counts 120s sec to cirrhosis [see below].  Stable.  # Cirrhosis [CT AP- NEG for lesion; GI [facility], [place]] s/p EGD-[place] [MAy 0240] discussed [place] surveillance.  # fatigue-alcohol/sleep apnea versus others.  Defer to PCP.   # DISPOSITION: # follow up in FEB 2024-MD; 1 week prior labs- cbc/cmp;PT/PTT; AFP; [place] abodmen -Dr.B  Cc; PCP    All questions were answered. The patient knows to call the clinic with any problems, questions or concerns.  Thank you Dr. for allowing me to participate in the care of your pleasant patient. Please do not hesitate to contact me with questions or concerns in the interim.   Cammie Sickle, MD 11/25/2021 10:02 PM   # 45 minutes face-to-face with the patient discussing the above plan of care; more than 50% of time spent on counseling and coordination.

## 2021-11-27 ENCOUNTER — Other Ambulatory Visit: Payer: Self-pay | Admitting: Primary Care

## 2021-11-27 DIAGNOSIS — J4540 Moderate persistent asthma, uncomplicated: Secondary | ICD-10-CM

## 2021-12-05 ENCOUNTER — Other Ambulatory Visit: Payer: Self-pay | Admitting: Primary Care

## 2021-12-05 DIAGNOSIS — J4540 Moderate persistent asthma, uncomplicated: Secondary | ICD-10-CM

## 2021-12-08 ENCOUNTER — Ambulatory Visit: Payer: Managed Care, Other (non HMO) | Admitting: Primary Care

## 2021-12-08 ENCOUNTER — Telehealth: Payer: Self-pay

## 2021-12-08 VITALS — BP 120/60 | HR 73 | Temp 96.9°F | Ht 64.0 in | Wt 178.4 lb

## 2021-12-08 DIAGNOSIS — E785 Hyperlipidemia, unspecified: Secondary | ICD-10-CM | POA: Diagnosis not present

## 2021-12-08 DIAGNOSIS — E1165 Type 2 diabetes mellitus with hyperglycemia: Secondary | ICD-10-CM | POA: Diagnosis not present

## 2021-12-08 DIAGNOSIS — D696 Thrombocytopenia, unspecified: Secondary | ICD-10-CM

## 2021-12-08 DIAGNOSIS — K7030 Alcoholic cirrhosis of liver without ascites: Secondary | ICD-10-CM

## 2021-12-08 DIAGNOSIS — E119 Type 2 diabetes mellitus without complications: Secondary | ICD-10-CM | POA: Diagnosis not present

## 2021-12-08 DIAGNOSIS — J4540 Moderate persistent asthma, uncomplicated: Secondary | ICD-10-CM

## 2021-12-08 DIAGNOSIS — R519 Headache, unspecified: Secondary | ICD-10-CM

## 2021-12-08 LAB — LIPID PANEL
Cholesterol: 158 mg/dL (ref 0–200)
HDL: 33.3 mg/dL — ABNORMAL LOW (ref >39.00)
NonHDL: 125.17
Total CHOL/HDL Ratio: 5
Triglycerides: 255 mg/dL — ABNORMAL HIGH (ref 0.0–149.0)
VLDL: 51 mg/dL — ABNORMAL HIGH (ref 0.0–40.0)

## 2021-12-08 LAB — POCT GLYCOSYLATED HEMOGLOBIN (HGB A1C): Hemoglobin A1C: 8 % — AB (ref 4.0–5.6)

## 2021-12-08 LAB — LDL CHOLESTEROL, DIRECT: Direct LDL: 72 mg/dL

## 2021-12-08 MED ORDER — FREESTYLE LANCETS MISC: 1.0000 | Freq: Three times a day (TID) | 2 refills | Status: AC

## 2021-12-08 MED ORDER — SUMATRIPTAN SUCCINATE 50 MG PO TABS: 50.0000 mg | ORAL_TABLET | ORAL | 0 refills | Status: DC | PRN

## 2021-12-08 MED ORDER — GLUCOSE BLOOD VI STRP: ORAL_STRIP | 3 refills | Status: AC

## 2021-12-08 MED ORDER — PROPRANOLOL HCL ER 80 MG PO CP24: 80.0000 mg | ORAL_CAPSULE | Freq: Every day | ORAL | 0 refills | Status: DC

## 2021-12-08 MED ORDER — BUDESONIDE-FORMOTEROL FUMARATE 160-4.5 MCG/ACT IN AERO: 2.0000 | INHALATION_SPRAY | Freq: Two times a day (BID) | RESPIRATORY_TRACT | 3 refills | Status: DC

## 2021-12-08 MED ORDER — PRAVASTATIN SODIUM 40 MG PO TABS: 40.0000 mg | ORAL_TABLET | Freq: Every day | ORAL | 2 refills | Status: DC

## 2021-12-08 NOTE — Telephone Encounter (Signed)
Prior auth started for Budesonide-Formoterol Fumarate 160-4.5MCG/ACT aerosol. Alexandria Leblanc Key: [id number] - PA Case ID: [id number] - Rx #: [id number]  Clinical questions still need to be answered.

## 2021-12-08 NOTE — Assessment & Plan Note (Addendum)
Reviewed GI notes, recent CT scan, upper endoscopy and colonoscopy.  Commended her on alcohol cessation.  Agree for continuous FMLA with start date of 12/12/2021, with 4 weeks of leave total. She will have her HR department fax this paperwork.

## 2021-12-08 NOTE — Patient Instructions (Signed)
For headache prevention: Start propanolol ER 80 mg.  Take 1 capsule by mouth every evening at bedtime.  For migraine abortion: You may take sumatriptan (Imitrex) 50 mg tablets.  Take 1 tablet by mouth at migraine onset, may repeat with second tablet 2 hours later if migraine persist.  Start Symbicort inhaler for asthma, take 1 puff by mouth twice daily, every day.  Please notify me if this inhaler is too expensive, or if you experience the burning sensation.  Stop by the lab prior to leaving today. I will notify you of your results once received.   Please have your HR department send me FMLA paperwork.  Please schedule a follow up visit for 3 months.  It was a pleasure to see you today!

## 2021-12-08 NOTE — Assessment & Plan Note (Signed)
Continue pravastatin 40 mg daily. Repeat lipid panel pending 

## 2021-12-08 NOTE — Assessment & Plan Note (Signed)
Following with hematology. Notes from June 2023 reviewed.

## 2021-12-08 NOTE — Assessment & Plan Note (Signed)
Uncontrolled.  Start propanolol ER 80 mg daily for headache prevention. Start sumatriptan 50 mg as needed for migraine abortion.  She will update.

## 2021-12-08 NOTE — Assessment & Plan Note (Signed)
Improving.  Continue Glipizide XL 10 mg daily, metformin 1000 mg BID, Ozempic 0.25 mg weekly with a dose increase to 0.5 mg weekly thereafter.  Follow up in 3 months.

## 2021-12-08 NOTE — Progress Notes (Signed)
Subjective:    Patient ID: Alexandria Leblanc, female    DOB: 1971-02-13, 51 y.o.   MRN: [id number]  HPI  Alexandria Leblanc is a very pleasant 51 y.o. female who presents today   1) Thrombocytopenia: Following with hematology/oncology, last visit was in June 2023.  Thrombocytopenia is suspected to be secondary to cirrhosis of the liver. She was advised to stop drinking alcohol. She is due for follow up soon.   2) Cirrhosis: Newer diagnosis as of 2023. Following with GI who suspects cirrhosis to be secondary to heavy alcohol consumption.  Last visit with GI was from May 2023, and during this visit it was recommended she reduce consumption of alcohol, start folic acid and thiamine supplements daily.  She was noted to have signs of portal hypertension on CT so she was scheduled for an upper endoscopy to evaluate for esophageal varices.  She underwent upper endoscopy and colonoscopy on 10/22/2021.  No esophageal varices, all biopsies taken were benign.  Several polyps noted during colonoscopy, due for recall in 10 years.  She's quit alcohol as of two weeks ago. She is taking folic acid vitamin daily, she is working to find thiamine.   She is requesting continuous FMLA for all of her appointments. She would like to take 4 weeks with a start date of 12/12/21.   3) Type 2 Diabetes:  Current medications include: Glipizide XL 10 mg daily, metformin 1000 mg twice daily, Ozempic 0.25 mg weekly. She will start on the 0.5 mg dose in two weeks.   She is checking her blood glucose on occasion when "feeling bad" and is getting readings of 90's-160's.   Last A1C: 9.6 in April 2023, 8.0 today. Last Eye Exam: Due Last Foot Exam: Due Pneumonia Vaccination: 2017 Urine Microalbumin: Up-to-date Statin: Pravastatin  Dietary changes since last visit: She has cut back on fried food and take out food.    Exercise: Active at work.   4) Asthma: Currently managed on montelukast 10 mg daily, albuterol inhaler as  needed.  Previously managed on Breo which was effective, but unfortunately this became cost prohibitive.  During her visit in April 2023 I advised her to check with her insurance to find out what may be covered.  Since her last visit she has not called her insurance company. She is using her albuterol inhaler twice weekly on average. She continues with daily wheezing, shortness of breath, cough. She was once managed on Symbicort which helped with symptoms, but caused a burning to the throat. She's not sure if the burning to the throat was actually secondary to Symbicort.   BP Readings from Last 3 Encounters:  12/08/21 120/60  11/18/21 (!) 142/77  10/22/21 137/75   5) Frequent Headaches: Chronic for years. Strong family history of migraines in most family members. Headaches are daily and occur mostly to the bilateral frontal lobes. Stress level will impact the intensity of her headaches. She experiences migraines 1-2 times monthly. She experiences nausea and photophobia with migraines.  Historically she will take [facility] powders with relief. She's never had prescription treatment for headache prevention.   Review of Systems  Constitutional:  Positive for fatigue.  Respiratory:  Positive for cough, shortness of breath and wheezing.   Cardiovascular:  Negative for chest pain.  Musculoskeletal:  Positive for arthralgias.  Neurological:  Positive for headaches.         Past Medical History:  Diagnosis Date   Acute non-recurrent sinusitis 07/06/2021   Alcohol abuse  Anxiety    Arthritis    Asthma    Depression    Gallstones    Genital warts    GERD (gastroesophageal reflux disease)    Hypertension    Migraines    Obesity    Type 2 diabetes mellitus (HCC)     Social History   Socioeconomic History   Marital status: Single    Spouse name: Not on file   Number of children: 0   Years of education: Not on file   Highest education level: Not on file  Occupational History    Occupation: Scientist, water quality  Tobacco Use   Smoking status: Every Day    Packs/day: 1.00    Years: 35.00    Total pack years: 35.00    Types: Cigarettes   Smokeless tobacco: Never  Vaping Use   Vaping Use: Never used  Substance and Sexual Activity   Alcohol use: Yes    Alcohol/week: 35.0 standard drinks of alcohol    Types: 35 Cans of beer per week   Drug use: No   Sexual activity: Not on file  Other Topics Concern   Not on file  Social History Narrative   Single; No children.; 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 1and half a day- smoking; 5 beers a week; no hard liqor.           Social Determinants of Health   Financial Resource Strain: Not on file  Food Insecurity: Not on file  Transportation Needs: Not on file  Physical Activity: Not on file  Stress: Not on file  Social Connections: Not on file  Intimate Partner Violence: Not on file    Past Surgical History:  Procedure Laterality Date   BIOPSY  10/22/2021   Procedure: BIOPSY;  Surgeon: Sharyn Creamer, MD;  Location: [facility];  Service: Gastroenterology;;  EGD and COLON   COLONOSCOPY WITH PROPOFOL N/A 10/22/2021   Procedure: COLONOSCOPY WITH PROPOFOL;  Surgeon: Sharyn Creamer, MD;  Location: [facility];  Service: Gastroenterology;  Laterality: N/A;   ESOPHAGOGASTRODUODENOSCOPY (EGD) WITH PROPOFOL N/A 10/22/2021   Procedure: ESOPHAGOGASTRODUODENOSCOPY (EGD) WITH PROPOFOL;  Surgeon: Sharyn Creamer, MD;  Location: [facility];  Service: Gastroenterology;  Laterality: N/A;   POLYPECTOMY  10/22/2021   Procedure: POLYPECTOMY;  Surgeon: Sharyn Creamer, MD;  Location: [facility];  Service: Gastroenterology;;    Family History  Problem Relation Age of Onset   Heart disease Mother    Hypertension Mother    Dementia Mother    Diabetes Mother    COPD Father    Hypertension Father    Alzheimer's disease Sister    Hyperlipidemia Brother    Diabetes Brother    Prostate  cancer Paternal Uncle    Colon cancer Paternal Uncle     Allergies  Allergen Reactions   Penicillins Hives and Itching   Sulfa Antibiotics Hives    Current Outpatient Medications on File Prior to Visit  Medication Sig Dispense Refill   acyclovir (ZOVIRAX) 400 MG tablet TAKE 1 TABLET BY MOUTH TWICE A DAY FOR HERPES PREVENTION. OFFICE VISIT REQUIREDFOR FURTHER REFILLS 180 tablet 0   albuterol (VENTOLIN HFA) 108 (90 Base) MCG/ACT inhaler INHALE 2 PUFFS INTO LUNGS EVERY 6 HOURS AS NEEDED FOR WHEEZING OR SHORTNESS OF BREATH 6.7 g 0   Blood Glucose Monitoring Suppl (FREESTYLE LITE) w/Device KIT 1 each by Does not apply route as directed. DX E11.9 1 kit 0   glipiZIDE (GLUCOTROL  XL) 10 MG 24 hr tablet Take 1 tablet (10 mg total) by mouth daily with breakfast. for diabetes. 90 tablet 1   hydrochlorothiazide (HYDRODIURIL) 25 MG tablet TAKE 1 TABLET BY MOUTH ONCE A DAY AS NEEDED FOR BLOOD PRESSURE 90 tablet 2   metFORMIN (GLUCOPHAGE) 1000 MG tablet TAKE 1 TABLET BY MOUTH TWICE A DAY FOR DIABETES 180 tablet 1   montelukast (SINGULAIR) 10 MG tablet TAKE 1 TABLET BY MOUTH EVERY NIGHT AT BEDTIME FOR ALLERGIES AND ASTHMA 90 tablet 2   olmesartan (BENICAR) 40 MG tablet Take 1 tablet (40 mg total) by mouth daily. for blood pressure. 90 tablet 1   ondansetron (ZOFRAN) 4 MG tablet Take 1 tablet (4 mg total) by mouth every 8 (eight) hours as needed for nausea or vomiting. 20 tablet 0   pantoprazole (PROTONIX) 40 MG tablet Take 1 tablet (40 mg total) by mouth 2 (two) times daily before a meal. 60 tablet 1   Semaglutide,0.25 or 0.5MG/DOS, (OZEMPIC, 0.25 OR 0.5 MG/DOSE,) 2 MG/1.5ML SOPN Inject 0.25 mg into the skin once weekly for 4 weeks, increase to 0.5 mg weekly thereafter for diabetes. 6 mL 1   No current facility-administered medications on file prior to visit.    BP 120/60   Pulse 73   Temp (!) 96.9 F (36.1 C) (Temporal)   Ht _0  (1.626 m)   Wt 178 lb 6 oz (80.9 kg)   LMP  (LMP Unknown)   SpO2 97%    BMI 30.62 kg/m  Objective:   Physical Exam Cardiovascular:     Rate and Rhythm: Normal rate and regular rhythm.  Pulmonary:     Effort: Pulmonary effort is normal.     Breath sounds: Normal breath sounds.  Musculoskeletal:     Cervical back: Neck supple.  Skin:    General: Skin is warm and dry.  Neurological:     Mental Status: She is alert.  Psychiatric:        Mood and Affect: Mood normal.           Assessment & Plan:   Problem List Items Addressed This Visit       Respiratory   Asthma, moderate persistent    Uncontrolled.  She did not call her insurance company as recommended last visit. She is willing to try Symbicort again as she doesn't feel that she actually had a reaction.   Rx for Symbicort sent to pharmacy. 1 puff BID.   She will update if she experiences burning to her throat and/or if Symbicort is cost prohibitive.       Relevant Medications   budesonide-formoterol (SYMBICORT) 160-4.5 MCG/ACT inhaler     Digestive   Alcoholic cirrhosis of liver without ascites ([place])    Reviewed GI notes, recent CT scan, upper endoscopy and colonoscopy.  Commended her on alcohol cessation.  Agree for continuous FMLA with start date of 12/12/2021, with 4 weeks of leave total. She will have her HR department fax this paperwork.        Endocrine   Type 2 diabetes mellitus with hyperglycemia (HCC) - Primary    Improving.  Continue Glipizide XL 10 mg daily, metformin 1000 mg BID, Ozempic 0.25 mg weekly with a dose increase to 0.5 mg weekly thereafter.  Follow up in 3 months.      Relevant Medications   Lancets (FREESTYLE) lancets   pravastatin (PRAVACHOL) 40 MG tablet   glucose blood test strip     Hematopoietic and Hemostatic   Thrombocytopenia (  [place])    Following with hematology. Notes from June 2023 reviewed.        Other   Hyperlipidemia    Continue pravastatin 40 mg daily. Repeat lipid panel pending.      Relevant Medications   pravastatin  (PRAVACHOL) 40 MG tablet   propranolol ER (INDERAL LA) 80 MG 24 hr capsule   Other Relevant Orders   Lipid panel   Frequent headaches    Uncontrolled.  Start propanolol ER 80 mg daily for headache prevention. Start sumatriptan 50 mg as needed for migraine abortion.  She will update.      Relevant Medications   propranolol ER (INDERAL LA) 80 MG 24 hr capsule   SUMAtriptan (IMITREX) 50 MG tablet   Other Visit Diagnoses     Type 2 diabetes mellitus without complication, without long-term current use of insulin (HCC)       Relevant Medications   pravastatin (PRAVACHOL) 40 MG tablet   Other Relevant Orders   POCT glycosylated hemoglobin (Hb A1C) (Completed)          Pleas Koch, NP

## 2021-12-08 NOTE — Assessment & Plan Note (Signed)
Uncontrolled.  She did not call her insurance company as recommended last visit. She is willing to try Symbicort again as she doesn't feel that she actually had a reaction.   Rx for Symbicort sent to pharmacy. 1 puff BID.   She will update if she experiences burning to her throat and/or if Symbicort is cost prohibitive.

## 2021-12-10 NOTE — Telephone Encounter (Signed)
Have answered questions and submitted on cover my meds.

## 2021-12-14 NOTE — Telephone Encounter (Addendum)
Prior auth for Budesonide-Formoterol Fumarate 160-4.5MCG/ACT aerosol has been denied. [place]: [id number] - PA Case ID: [id number] - Rx #: [id number]  We reviewed the information you provided in support of a request to obtain Budesonideformoterol fumarate 160-4.32mg HFA AER AD under your patient's plan. We are unable to approve this request for the following reason(s): ? The patient is directed to use Symbicort (brand). Coverage cannot be authorized at this time.  We based this decision on the prior authorization criteria for: 80124 budesonide-formoterol, authorized generic of Symbicort - ESI Standard Non-Preferred Drug Coverage Review.

## 2021-12-15 NOTE — Telephone Encounter (Signed)
I have sent message to patient letting know that we have not received request. Just wanted to make sure you do not have anything that has been received that we are not aware of.

## 2021-12-15 NOTE — Telephone Encounter (Signed)
Added to open message no further action needed on this encounter at this time.

## 2021-12-15 NOTE — Telephone Encounter (Signed)
[place] [employer], Claims Phone No.: [phone] Ext. [phone] Secure Fax No.: [phone] Attachments: Direct Deposit Application

## 2021-12-15 NOTE — Telephone Encounter (Signed)
Do we know what her insurance will cover?  Joellen, please notify patient that Symbciort is not covered. Have her contact her insurance ASAP to find out what is covered and affordable.

## 2021-12-15 NOTE — Telephone Encounter (Signed)
Do you have any ppw on this?

## 2021-12-16 NOTE — Telephone Encounter (Signed)
Called patient she will call her insurance now and let us know what is covered. No further action needed at this time.

## 2021-12-16 NOTE — Telephone Encounter (Signed)
Ladies, can you please help here? I have not received any paperwork.

## 2021-12-17 ENCOUNTER — Telehealth: Payer: Self-pay

## 2021-12-17 ENCOUNTER — Ambulatory Visit: Payer: Managed Care, Other (non HMO) | Admitting: Internal Medicine

## 2021-12-17 DIAGNOSIS — J4540 Moderate persistent asthma, uncomplicated: Secondary | ICD-10-CM

## 2021-12-17 DIAGNOSIS — Z23 Encounter for immunization: Secondary | ICD-10-CM | POA: Diagnosis not present

## 2021-12-17 DIAGNOSIS — K7030 Alcoholic cirrhosis of liver without ascites: Secondary | ICD-10-CM

## 2021-12-17 DIAGNOSIS — Z0279 Encounter for issue of other medical certificate: Secondary | ICD-10-CM

## 2021-12-17 NOTE — Telephone Encounter (Signed)
We received a fax from [place] of a Short Term Disability form.  This is continuous leave from 12/16/21-01/09/22, to return to work on 01/10/22.  Reason for being out of work:  cirrhosis of the liver and colitis, which cause nausea and vomiting.  Patient has not had inpatient care.  Flare ups/appts anticipated - about 2 times per week.  Planned appts 02/04/22 - in office surgery due to lesion on buttock area, 03/10/2022 - 3 month follow up with Allie Bossier.  Has been referred to [facility] for colitis.  This form is due back by 12/21/21.  Form has been placed in your inbox for completion and signing.   When forms are signed and complete, patient will pick up a copy.  Ok to send msg on [place] when forms are available to pick up.

## 2021-12-17 NOTE — Telephone Encounter (Signed)
Completed and placed on Kate's desk 

## 2021-12-18 MED ORDER — FLUTICASONE-SALMETEROL 100-50 MCG/ACT IN AEPB: 1.0000 | INHALATION_SPRAY | Freq: Two times a day (BID) | RESPIRATORY_TRACT | 3 refills | Status: DC

## 2021-12-18 NOTE — Telephone Encounter (Signed)
Have you received this form yet?

## 2021-12-18 NOTE — Telephone Encounter (Signed)
Completed form has been faxed to F# [phone].  Received fax confirmation.  Copies have been made for myself, billing, scanning, and Ms Maffia.

## 2021-12-23 ENCOUNTER — Encounter: Payer: Self-pay | Admitting: Surgery

## 2021-12-30 ENCOUNTER — Ambulatory Visit: Payer: Managed Care, Other (non HMO) | Admitting: Surgery

## 2021-12-31 ENCOUNTER — Telehealth: Payer: Self-pay

## 2021-12-31 ENCOUNTER — Other Ambulatory Visit: Payer: Self-pay

## 2021-12-31 NOTE — Progress Notes (Signed)
amb  

## 2021-12-31 NOTE — Telephone Encounter (Signed)
Per Dr. Hampton Abbot,  This patient is on my schedule for tomorrow 12/30/21.  I had seen her in May 2023 for cholelithiasis.  But she also has portal hypertension and cirrhosis, and we talked about that if she were having more issues with her gallbladder, that she needed referral to tertiary center.  So I think that should be the plan for her, rather than an appointment with me.  Can y'all refer her to general surgery at [facility] or [facility] or [facility] depending on her choice?  Can cancel the appointment with me.    Pt notified and refused referral stating it was too far for her to drive. Pt also stated that she is doing fine and has had no flare ups.

## 2022-01-11 ENCOUNTER — Other Ambulatory Visit: Payer: Self-pay | Admitting: Primary Care

## 2022-01-11 DIAGNOSIS — R519 Headache, unspecified: Secondary | ICD-10-CM

## 2022-01-20 ENCOUNTER — Encounter (HOSPITAL_BASED_OUTPATIENT_CLINIC_OR_DEPARTMENT_OTHER): Payer: Self-pay | Admitting: Surgery

## 2022-01-20 ENCOUNTER — Other Ambulatory Visit: Payer: Self-pay

## 2022-01-20 NOTE — Progress Notes (Incomplete)
Spoke w/ via phone for pre-op interview---pt Lab needs dos---- I stat              Lab results------ COVID test -----patient states asymptomatic no test needed Arrive at -------630 am 02-04-2022 NPO after MN NO Solid Food.  Clear liquids from MN until---530 am Med rec completed Medications to take morning of surgery -----albuterol inhaler prn/bring inhaler, inhaler,  Diabetic medication -----none day of surgery Patient instructed no nail polish to be worn day of surgery Patient instructed to bring photo id and insurance card day of surgery Patient aware to have Driver (ride ) / caregiver  sister Alexandria Leblanc will stay   for 24 hours after surgery  Patient Special Instructions -----none Pre-Op special Istructions -----none Patient verbalized understanding of instructions that were given at this phone interview. Patient denies shortness of breath, chest pain, fever, cough at this phone interview.   Oncology lov dr g brahmanday 11-18-2021 epic (thrombocytopenia) epic Ekg 03-11-2021 chart epic,  chest xray 03-11-2021 epic Chest ct 05-15-2021 epic

## 2022-01-21 ENCOUNTER — Encounter (HOSPITAL_BASED_OUTPATIENT_CLINIC_OR_DEPARTMENT_OTHER): Payer: Self-pay | Admitting: Surgery

## 2022-02-04 ENCOUNTER — Encounter (HOSPITAL_BASED_OUTPATIENT_CLINIC_OR_DEPARTMENT_OTHER)
Admission: RE | Disposition: A | Discharge status: Home / Self Care | Payer: Self-pay | Source: Home / Self Care | Attending: Surgery

## 2022-02-04 ENCOUNTER — Other Ambulatory Visit: Payer: Self-pay

## 2022-02-04 ENCOUNTER — Ambulatory Visit: Payer: Self-pay | Admitting: Surgery

## 2022-02-04 ENCOUNTER — Ambulatory Visit (HOSPITAL_BASED_OUTPATIENT_CLINIC_OR_DEPARTMENT_OTHER): Payer: Managed Care, Other (non HMO) | Admitting: Anesthesiology

## 2022-02-04 ENCOUNTER — Encounter (HOSPITAL_BASED_OUTPATIENT_CLINIC_OR_DEPARTMENT_OTHER): Payer: Self-pay | Admitting: Surgery

## 2022-02-04 ENCOUNTER — Ambulatory Visit (HOSPITAL_BASED_OUTPATIENT_CLINIC_OR_DEPARTMENT_OTHER)
Admission: RE | Admit: 2022-02-04 | Discharge: 2022-02-04 | Disposition: A | Discharge status: Home / Self Care | Payer: Managed Care, Other (non HMO) | Attending: Surgery | Admitting: Surgery

## 2022-02-04 DIAGNOSIS — E785 Hyperlipidemia, unspecified: Secondary | ICD-10-CM | POA: Diagnosis not present

## 2022-02-04 DIAGNOSIS — E119 Type 2 diabetes mellitus without complications: Secondary | ICD-10-CM | POA: Diagnosis not present

## 2022-02-04 DIAGNOSIS — K6289 Other specified diseases of anus and rectum: Secondary | ICD-10-CM | POA: Diagnosis not present

## 2022-02-04 DIAGNOSIS — J45909 Unspecified asthma, uncomplicated: Secondary | ICD-10-CM | POA: Diagnosis not present

## 2022-02-04 DIAGNOSIS — Z7984 Long term (current) use of oral hypoglycemic drugs: Secondary | ICD-10-CM | POA: Diagnosis not present

## 2022-02-04 DIAGNOSIS — F17210 Nicotine dependence, cigarettes, uncomplicated: Secondary | ICD-10-CM | POA: Insufficient documentation

## 2022-02-04 DIAGNOSIS — Z01818 Encounter for other preprocedural examination: Secondary | ICD-10-CM

## 2022-02-04 DIAGNOSIS — I10 Essential (primary) hypertension: Secondary | ICD-10-CM | POA: Insufficient documentation

## 2022-02-04 DIAGNOSIS — A630 Anogenital (venereal) warts: Secondary | ICD-10-CM | POA: Diagnosis present

## 2022-02-04 HISTORY — DX: Unspecified cirrhosis of liver: K74.60

## 2022-02-04 HISTORY — DX: Esophageal varices without bleeding: I85.00

## 2022-02-04 HISTORY — DX: Unspecified osteoarthritis, unspecified site: M19.90

## 2022-02-04 HISTORY — DX: Carpal tunnel syndrome, bilateral upper limbs: G56.03

## 2022-02-04 HISTORY — DX: Splenomegaly, not elsewhere classified: R16.1

## 2022-02-04 HISTORY — PX: RECTAL EXAM UNDER ANESTHESIA: SHX6399

## 2022-02-04 HISTORY — DX: Thrombocytopenia, unspecified: D69.6

## 2022-02-04 HISTORY — DX: Cough, unspecified: R05.9

## 2022-02-04 HISTORY — PX: LESION REMOVAL: SHX5196

## 2022-02-04 LAB — POCT I-STAT, CHEM 8
BUN: 12 mg/dL (ref 6–20)
Calcium, Ion: 0.99 mmol/L — ABNORMAL LOW (ref 1.15–1.40)
Chloride: 104 mmol/L (ref 98–111)
Creatinine, Ser: 0.3 mg/dL — ABNORMAL LOW (ref 0.44–1.00)
Glucose, Bld: 220 mg/dL — ABNORMAL HIGH (ref 70–99)
HCT: 38 % (ref 36.0–46.0)
Hemoglobin: 12.9 g/dL (ref 12.0–15.0)
Potassium: 4.5 mmol/L (ref 3.5–5.1)
Sodium: 134 mmol/L — ABNORMAL LOW (ref 135–145)
TCO2: 21 mmol/L — ABNORMAL LOW (ref 22–32)

## 2022-02-04 LAB — GLUCOSE, CAPILLARY: Glucose-Capillary: 213 mg/dL — ABNORMAL HIGH (ref 70–99)

## 2022-02-04 SURGERY — EXAM UNDER ANESTHESIA, RECTUM
Anesthesia: General | Site: Rectum

## 2022-02-04 MED ORDER — ONDANSETRON HCL 4 MG/2ML IJ SOLN
INTRAMUSCULAR | Status: DC | PRN
  Administered 2022-02-04: 4 mg via INTRAVENOUS

## 2022-02-04 MED ORDER — PROPOFOL 10 MG/ML IV BOLUS
INTRAVENOUS | Status: AC
  Filled 2022-02-04: qty 20

## 2022-02-04 MED ORDER — BUPIVACAINE-EPINEPHRINE 0.25% -1:200000 IJ SOLN
INTRAMUSCULAR | Status: DC | PRN
  Administered 2022-02-04: 18 mL

## 2022-02-04 MED ORDER — FENTANYL CITRATE (PF) 100 MCG/2ML IJ SOLN: 25.0000 ug | INTRAMUSCULAR | Status: DC | PRN

## 2022-02-04 MED ORDER — FENTANYL CITRATE (PF) 250 MCG/5ML IJ SOLN
INTRAMUSCULAR | Status: DC | PRN
  Administered 2022-02-04: 25 ug via INTRAVENOUS
  Administered 2022-02-04: 50 ug via INTRAVENOUS

## 2022-02-04 MED ORDER — ACETAMINOPHEN 500 MG PO TABS
ORAL_TABLET | ORAL | Status: AC
  Filled 2022-02-04: qty 2

## 2022-02-04 MED ORDER — LIDOCAINE HCL (PF) 2 % IJ SOLN
INTRAMUSCULAR | Status: AC
  Filled 2022-02-04: qty 20

## 2022-02-04 MED ORDER — 0.9 % SODIUM CHLORIDE (POUR BTL) OPTIME
TOPICAL | Status: DC | PRN
  Administered 2022-02-04: 500 mL

## 2022-02-04 MED ORDER — DEXAMETHASONE SODIUM PHOSPHATE 4 MG/ML IJ SOLN
INTRAMUSCULAR | Status: DC | PRN
  Administered 2022-02-04: 4 mg via INTRAVENOUS

## 2022-02-04 MED ORDER — PHENYLEPHRINE 80 MCG/ML (10ML) SYRINGE FOR IV PUSH (FOR BLOOD PRESSURE SUPPORT)
PREFILLED_SYRINGE | INTRAVENOUS | Status: AC
  Filled 2022-02-04: qty 20

## 2022-02-04 MED ORDER — LIDOCAINE 2% (20 MG/ML) 5 ML SYRINGE
INTRAMUSCULAR | Status: DC | PRN
  Administered 2022-02-04: 40 mg via INTRAVENOUS

## 2022-02-04 MED ORDER — MIDAZOLAM HCL 2 MG/2ML IJ SOLN
INTRAMUSCULAR | Status: DC | PRN
  Administered 2022-02-04: 2 mg via INTRAVENOUS

## 2022-02-04 MED ORDER — INSULIN ASPART 100 UNIT/ML IJ SOLN
2.0000 [IU] | Freq: Once | INTRAMUSCULAR | Status: AC
Start: 2022-02-04 — End: 2022-02-04
  Administered 2022-02-04: 2 [IU] via SUBCUTANEOUS

## 2022-02-04 MED ORDER — LACTATED RINGERS IV SOLN
INTRAVENOUS | Status: DC
  Administered 2022-02-04: 1000 mL via INTRAVENOUS

## 2022-02-04 MED ORDER — INSULIN ASPART 100 UNIT/ML IJ SOLN
INTRAMUSCULAR | Status: AC
  Filled 2022-02-04: qty 1

## 2022-02-04 MED ORDER — PHENYLEPHRINE 80 MCG/ML (10ML) SYRINGE FOR IV PUSH (FOR BLOOD PRESSURE SUPPORT)
PREFILLED_SYRINGE | INTRAVENOUS | Status: DC | PRN
  Administered 2022-02-04 (×2): 80 ug via INTRAVENOUS

## 2022-02-04 MED ORDER — PROPOFOL 10 MG/ML IV BOLUS
INTRAVENOUS | Status: DC | PRN
  Administered 2022-02-04: 200 mg via INTRAVENOUS

## 2022-02-04 MED ORDER — BUPIVACAINE LIPOSOME 1.3 % IJ SUSP: 20.0000 mL | Freq: Once | INTRAMUSCULAR | Status: DC

## 2022-02-04 MED ORDER — CEFAZOLIN SODIUM-DEXTROSE 2-4 GM/100ML-% IV SOLN
2.0000 g | INTRAVENOUS | Status: AC
  Administered 2022-02-04: 2 g via INTRAVENOUS

## 2022-02-04 MED ORDER — DIPHENHYDRAMINE HCL 50 MG/ML IJ SOLN
INTRAMUSCULAR | Status: AC
  Filled 2022-02-04: qty 1

## 2022-02-04 MED ORDER — FENTANYL CITRATE (PF) 100 MCG/2ML IJ SOLN
INTRAMUSCULAR | Status: AC
  Filled 2022-02-04: qty 2

## 2022-02-04 MED ORDER — BUPIVACAINE LIPOSOME 1.3 % IJ SUSP
INTRAMUSCULAR | Status: DC | PRN
  Administered 2022-02-04: 12 mL

## 2022-02-04 MED ORDER — CEFAZOLIN SODIUM-DEXTROSE 2-4 GM/100ML-% IV SOLN
INTRAVENOUS | Status: AC
  Filled 2022-02-04: qty 100

## 2022-02-04 MED ORDER — TRAMADOL HCL 50 MG PO TABS: 50.0000 mg | ORAL_TABLET | Freq: Two times a day (BID) | ORAL | 0 refills | Status: AC | PRN

## 2022-02-04 MED ORDER — ACETAMINOPHEN 500 MG PO TABS
1000.0000 mg | ORAL_TABLET | ORAL | Status: AC
  Administered 2022-02-04: 1000 mg via ORAL

## 2022-02-04 MED ORDER — MIDAZOLAM HCL 2 MG/2ML IJ SOLN
INTRAMUSCULAR | Status: AC
  Filled 2022-02-04: qty 2

## 2022-02-04 SURGICAL SUPPLY — 39 items
APL SKNCLS STERI-STRIP NONHPOA (GAUZE/BANDAGES/DRESSINGS) ×1
BENZOIN TINCTURE PRP APPL 2/3 (GAUZE/BANDAGES/DRESSINGS) ×1 IMPLANT
BLADE SURG 15 STRL LF DISP TIS (BLADE) ×1 IMPLANT
BLADE SURG 15 STRL SS (BLADE) ×1
BRIEF MESH DISP LRG (UNDERPADS AND DIAPERS) ×1 IMPLANT
COVER BACK TABLE 60X90IN (DRAPES) ×1 IMPLANT
COVER MAYO STAND STRL (DRAPES) ×1 IMPLANT
DRAPE HYSTEROSCOPY (MISCELLANEOUS) IMPLANT
DRAPE SHEET LG 3/4 BI-LAMINATE (DRAPES) IMPLANT
DRSG PAD ABDOMINAL 8X10 ST (GAUZE/BANDAGES/DRESSINGS) IMPLANT
GAUZE 4X4 16PLY ~~LOC~~+RFID DBL (SPONGE) ×1 IMPLANT
GAUZE PAD ABD 8X10 STRL (GAUZE/BANDAGES/DRESSINGS) ×1 IMPLANT
GAUZE SPONGE 4X4 12PLY STRL (GAUZE/BANDAGES/DRESSINGS) IMPLANT
GLOVE BIO SURGEON STRL SZ7.5 (GLOVE) ×1 IMPLANT
GLOVE BIOGEL PI IND STRL 6.5 (GLOVE) IMPLANT
GLOVE BIOGEL PI IND STRL 7.0 (GLOVE) IMPLANT
GLOVE BIOGEL PI IND STRL 8 (GLOVE) ×1 IMPLANT
GLOVE ECLIPSE 6.5 STRL STRAW (GLOVE) IMPLANT
GLOVE SURG SS PI 7.5 STRL IVOR (GLOVE) IMPLANT
GOWN STRL REUS W/TWL LRG LVL3 (GOWN DISPOSABLE) ×1 IMPLANT
KIT TURNOVER CYSTO (KITS) ×1 IMPLANT
LEGGING LITHOTOMY PAIR STRL (DRAPES) IMPLANT
MANIFOLD NEPTUNE II (INSTRUMENTS) IMPLANT
NEEDLE HYPO 22GX1.5 SAFETY (NEEDLE) ×1 IMPLANT
NS IRRIG 500ML POUR BTL (IV SOLUTION) ×1 IMPLANT
PACK BASIN DAY SURGERY FS (CUSTOM PROCEDURE TRAY) ×1 IMPLANT
PENCIL SMOKE EVACUATOR (MISCELLANEOUS) ×1 IMPLANT
SOL PREP POV-IOD 4OZ 10% (MISCELLANEOUS) IMPLANT
SURGILUBE 2OZ TUBE FLIPTOP (MISCELLANEOUS) IMPLANT
SUT CHROMIC 3 0 SH 27 (SUTURE) IMPLANT
SUT SILK 0 TIES 10X30 (SUTURE) ×1 IMPLANT
SUT SILK 2 0 SH (SUTURE) ×1 IMPLANT
SUT VIC AB 3-0 SH 18 (SUTURE) IMPLANT
SYR BULB IRRIG 60ML STRL (SYRINGE) ×1 IMPLANT
SYR CONTROL 10ML LL (SYRINGE) ×1 IMPLANT
TOWEL OR 17X26 10 PK STRL BLUE (TOWEL DISPOSABLE) ×1 IMPLANT
TRAY DSU PREP LF (CUSTOM PROCEDURE TRAY) ×1 IMPLANT
TUBE CONNECTING 12X1/4 (SUCTIONS) ×1 IMPLANT
YANKAUER SUCT BULB TIP NO VENT (SUCTIONS) ×1 IMPLANT

## 2022-02-04 NOTE — Op Note (Signed)
02/04/2022  12:18 PM  PATIENT:  Alexandria Leblanc  51 y.o. female  Patient Care Team: Pleas Koch, NP as PCP - General (Internal Medicine) Cammie Sickle, MD as Consulting Physician (Internal Medicine)  PRE-OPERATIVE DIAGNOSIS:  Perianal lesions  POST-OPERATIVE DIAGNOSIS:  Same  PROCEDURE:   Excision of perianal skin lesion 1 x 1 cm, right posterior Excision of perianal skin lesions, 1 x 1 cm, anterior midline Anorectal exam under anesthesia  SURGEON:  Surgeon(s): Ileana Roup, MD   ANESTHESIA:   general  SPECIMEN:   Right posterior perianal skin lesion Anterior midline perianal skin lesion  DISPOSITION OF SPECIMEN:  PATHOLOGY  COUNTS:  Sponge, needle, and instrument counts were reported correct x2 at conclusion.  EBL: 5 mL  PLAN OF CARE: Discharge to home after PACU  PATIENT DISPOSITION:  PACU - hemodynamically stable.  OR FINDINGS: Perianal skin lesions with verrucous type appearnce suspicious clinically for anal condyloma. All visible lesions were excised. Anal canal meticulously inspected with anoscopic guidance and there is no lesions evident in the anal canal. Normal anoderm. No fissures or significant hemorrhoidal tissue.  DESCRIPTION: The patient was identified in the preoperative holding area and taken to the OR where she was placed on the operating room table. SCDs were placed.  General anesthesia was induced without difficulty. The patient was then positioned in high lithotomy with Allen stirrups. Pressure points were then evaluated and padded.  She was then prepped and draped in usual sterile fashion.  A surgical timeout was performed indicating the correct patient, procedure, and positioning.  A perianal block was performed using a dilute mixture of 0.25% Marcaine with epinephrine and Exparel.   After ascertaining that an appropriate level of anesthesia had been achieved, a well lubricated digital rectal exam was performed. This demonstrated  no palpable masses.  A Hill-Ferguson anoscope was into the anal canal and circumferential inspection demonstrated normal anoderm without inflammation, granulation, or fissures.  No significant internal hemorrhoidal component.  Externally, there are 2 lesions within the anal margin.  These are both approximately 1 x 1 cm in size with a verrucous type appearance suspicious clinically for condyloma.  There are no other evident lesions.  Beginning with the right posterior, the tissue was elevated with a forcep and sharply excised using a scalpel.  This was teased away from the underlying sphincter muscle and no muscle fibers were divided.  Hemostasis was then achieved electrocautery and the specimen was passed off.  Attention is then directed at the anterior midline lesion.  This is 1 x 1 cm in size.  This has also a verrucous type appearance and is suspicious for condyloma. There are no other evident lesions.  Beginning with the right posterior, the tissue was elevated with a forcep and sharply excised using a scalpel.  This was teased away from the underlying sphincter muscle and no muscle fibers were divided.  Hemostasis was then achieved electrocautery and the specimen was passed off.  Both excision defects were then approximated using a running 3-0 chromic stitch.  The perianal area is inspected and hemostasis appreciated.  All sponge, needle, and instrument counts were reported correct.  A dressing consisting of 4 x 4's, ABD, and mesh underwear is placed.  She was then taken out of the lithotomy position, wake from anesthesia, and transferred to a stretcher for transport to PACU in satisfactory condition.  DISPOSITION: PACU in satisfactory condition.

## 2022-02-04 NOTE — Discharge Instructions (Addendum)
Post Anesthesia Home Care Instructions  Activity: Get plenty of rest for the remainder of the day. A responsible individual must stay with you for 24 hours following the procedure.  For the next 24 hours, DO NOT: -Drive a car -Paediatric nurse -Drink alcoholic beverages -Take any medication unless instructed by your physician -Make any legal decisions or sign important papers.  Meals: Start with liquid foods such as gelatin or soup. Progress to regular foods as tolerated. Avoid greasy, spicy, heavy foods. If nausea and/or vomiting occur, drink only clear liquids until the nausea and/or vomiting subsides. Call your physician if vomiting continues.  Special Instructions/Symptoms: Your throat may feel dry or sore from the anesthesia or the breathing tube placed in your throat during surgery. If this causes discomfort, gargle with warm salt water. The discomfort should disappear within 24 hours.  May take Tylenol beginning at 4:30 PM as needed for pain.  Information for Discharge Teaching: EXPAREL (bupivacaine liposome injectable suspension)   Your surgeon or anesthesiologist gave you EXPAREL(bupivacaine) to help control your pain after surgery.  EXPAREL is a local anesthetic that provides pain relief by numbing the tissue around the surgical site. EXPAREL is designed to release pain medication over time and can control pain for up to 72 hours. Depending on how you respond to EXPAREL, you may require less pain medication during your recovery.  Possible side effects: Temporary loss of sensation or ability to move in the area where bupivacaine was injected. Nausea, vomiting, constipation Rarely, numbness and tingling in your mouth or lips, lightheadedness, or anxiety may occur. Call your doctor right away if you think you may be experiencing any of these sensations, or if you have other questions regarding possible side effects.  Follow all other discharge instructions given to you by  your surgeon or nurse. Eat a healthy diet and drink plenty of water or other fluids.  If you return to the hospital for any reason within 96 hours following the administration of EXPAREL, it is important for health care providers to know that you have received this anesthetic. A teal colored band has been placed on your arm with the date, time and amount of EXPAREL you have received in order to alert and inform your health care providers. Please leave this armband in place for the full 96 hours following administration, and then you may remove the band.   Do not remove green arm band until Monday, Sept. 11, 2023.  ANORECTAL SURGERY: POST OP INSTRUCTIONS  DIET: Follow a light bland diet the first 24 hours after arrival home, such as soup, liquids, crackers, etc.  Be sure to include lots of fluids daily.  Avoid fast food or heavy meals as your are more likely to get nauseated.  Eat a low fat diet the next few days after surgery.   Some bleeding with bowel movements is expected for the first couple of days but this should stop in between bowel movements  Take your usually prescribed home medications unless otherwise directed. No foreign bodies per rectum for the next 3 months (enemas, etc)  PAIN CONTROL: It is helpful to take an over-the-counter pain medication regularly for the first few days/weeks.  Choose from the following that works best for you: Ibuprofen (Advil, etc) Three '200mg'$  tabs every 6 hours as needed. Acetaminophen (Tylenol, etc) 500-'650mg'$  every 6 hours as needed NOTE: You may take both of these medications together - most patients find it most helpful when alternating between the two (i.e. Ibuprofen at 6am,  tylenol at 9am, ibuprofen at 12pm ..Marland Kitchen) A  prescription for pain medication may have been prescribed for you at discharge.  Take your pain medication as prescribed.  If you are having problems/concerns with the prescription medicine, please call us for further advice.  Avoid  getting constipated.  Between the surgery and the pain medications, it is common to experience some constipation.  Increasing fluid intake (64oz of water per day) and taking a fiber supplement (such as Metamucil, Citrucel, FiberCon) 1-2 times a day regularly will usually help prevent this problem from occurring.  Take Miralax (over the counter) 1-2x/day while taking a narcotic pain medication. If no bowel movement after 48hours, you may additionally take a laxative like a bottle of Milk of Magnesia which can be purchased over the counter. Avoid enemas.   Watch out for diarrhea.  If you have many loose bowel movements, simplify your diet to bland foods.  Stop any stool softeners and decrease your fiber supplement. If this worsens or does not improve, please call us.  Wash / shower every day.  If you were discharged with a dressing, you may remove this the day after your surgery. You may shower normally, getting soap/water on your wound, particularly after bowel movements.  Soaking in a warm bath filled a couple inches ("Sitz bath") is a great way to clean the area after a bowel movement and many patients find it is a way to soothe the area.  ACTIVITIES as tolerated:   You may resume regular (light) daily activities beginning the next day--such as daily self-care, walking, climbing stairs--gradually increasing activities as tolerated.  If you can walk 30 minutes without difficulty, it is safe to try more intense activity such as jogging, treadmill, bicycling, low-impact aerobics, etc. Refrain from any heavy lifting or straining for the first 2 weeks after your procedure, particularly if your surgery was for hemorrhoids. Avoid activities that make your pain worse You may drive when you are no longer taking prescription pain medication, you can comfortably wear a seatbelt, and you can safely maneuver your car and apply brakes.  FOLLOW UP in our office Please call [facility] at [phone] to set up an  appointment to see your surgeon in the office for a follow-up appointment approximately 2 weeks after your surgery. Make sure that you call for this appointment the day you arrive home to insure a convenient appointment time.  9. If you have disability or family leave forms that need to be completed, you may have them completed by your primary care physician's office; for return to work instructions, please ask our office staff and they will be happy to assist you in obtaining this documentation   When to call us [phone]: Poor pain control Reactions / problems with new medications (rash/itching, etc)  Fever over 101.5 F (38.5 C) Inability to urinate Nausea/vomiting Worsening swelling or bruising Continued bleeding from incision. Increased pain, redness, or drainage from the incision  The clinic staff is available to answer your questions during regular business hours (8:30am-5pm).  Please don't hesitate to call and ask to speak to one of our nurses for clinical concerns.   A surgeon from [facility] is always on call at the hospitals   If you have a medical emergency, go to the nearest emergency room or call 911.   [facility] [address], [place], [place], [place]  [address] MAIN: [phone] FAX: [phone] [email or web address]

## 2022-02-04 NOTE — H&P (Signed)
CC: Here today for surgery  HPI: ZYRIA FISCUS is an 51 y.o. female with history of HTN, HLD, DM, HSV, whom was seen in the office as a referral by Dr. 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   Colonoscopy with Dr. Lorenso Courier 10/22/2021 demonstrated: 1. Normal TI 2. Area of mildly erythematous mucosa in the entire colon that was biopsied. Path showed collagenous colitis 3. 2 mm polyp in cecum 4. 2 sessile polyps in sigmoid removed-benign 5. 3 sessile polyps in the sigmoid removed-benign 6. Nonbleeding internal hemorrhoids 7. Perianal condyloma  She reports a history of having had warts removed from the vaginal area in the past by her primary care. She is unsure of her last Pap smear. She has plans to follow-up with her PCP on July 11 and states that she was going to request a Pap smear at that visit or referral if one is necessary.  She denies any history of anal pain, any apparent hemorrhoidal issues or blood in her stool.  She denies any changes in her health or health history since we met in the office. States she is ready for surgery.   PMH: HTN, HLD, DM, HSV,  PSH: Vaginal wart removal procedure with PCP  FHx: Denies any known family history of colorectal, breast, endometrial or ovarian cancer  Social Hx: Smokes 1.5 packs/day; drinks 4 beers twice a week; denies illicit drug. Works as a Scientist, water quality at [employer]  Past Medical History:  Diagnosis Date   Alcohol abuse    Anxiety    Arthritis    Asthma    Carpal tunnel syndrome, bilateral    Depression    DJD (degenerative joint disease)    lower back   Gallstones    Genital warts    GERD (gastroesophageal reflux disease)    Hepatic cirrhosis (HCC)    Hypertension    Migraines    Obesity    Portal hypertension with esophageal varices (HCC)    Splenomegaly    Thrombocytopenia (HCC)    Type 2 diabetes mellitus ([place])     Past Surgical History:  Procedure Laterality Date   BIOPSY  10/22/2021    Procedure: BIOPSY;  Surgeon: Sharyn Creamer, MD;  Location: [facility];  Service: Gastroenterology;;  EGD and COLON   COLONOSCOPY WITH PROPOFOL N/A 10/22/2021   Procedure: COLONOSCOPY WITH PROPOFOL;  Surgeon: Sharyn Creamer, MD;  Location: [facility];  Service: Gastroenterology;  Laterality: N/A;   ESOPHAGOGASTRODUODENOSCOPY (EGD) WITH PROPOFOL N/A 10/22/2021   Procedure: ESOPHAGOGASTRODUODENOSCOPY (EGD) WITH PROPOFOL;  Surgeon: Sharyn Creamer, MD;  Location: [facility];  Service: Gastroenterology;  Laterality: N/A;   POLYPECTOMY  10/22/2021   Procedure: POLYPECTOMY;  Surgeon: Sharyn Creamer, MD;  Location: [facility];  Service: Gastroenterology;;    Family History  Problem Relation Age of Onset   Heart disease Mother    Hypertension Mother    Dementia Mother    Diabetes Mother    COPD Father    Hypertension Father    Alzheimer's disease Sister    Hyperlipidemia Brother    Diabetes Brother    Prostate cancer Paternal Uncle    Colon cancer Paternal Uncle     Social:  reports that she has been smoking cigarettes. She has a 35.00 pack-year smoking history. She has never used smokeless tobacco. She reports current alcohol use of about 35.0 standard drinks of alcohol per week. She reports that she does not use drugs.  Allergies:  Allergies  Allergen Reactions  Penicillins Hives and Itching   Sulfa Antibiotics Hives    Medications: I have reviewed the patient's current medications.  Results for orders placed or performed during the hospital encounter of 02/04/22 (from the past 48 hour(s))  I-STAT, chem 8     Status: Abnormal   Collection Time: 02/04/22 10:24 AM  Result Value Ref Range   Sodium 134 (L) 135 - 145 mmol/L   Potassium 4.5 3.5 - 5.1 mmol/L   Chloride 104 98 - 111 mmol/L   BUN 12 6 - 20 mg/dL   Creatinine, Ser 0.30 (L) 0.44 - 1.00 mg/dL   Glucose, Bld 220 (H) 70 - 99 mg/dL    Comment: Glucose reference range applies only to samples taken after fasting for at  least 8 hours.   Calcium, Ion 0.99 (L) 1.15 - 1.40 mmol/L   TCO2 21 (L) 22 - 32 mmol/L   Hemoglobin 12.9 12.0 - 15.0 g/dL   HCT 38.0 36.0 - 46.0 %    No results found.  ROS - all of the below systems have been reviewed with the patient and positives are indicated with bold text General: chills, fever or night sweats Eyes: blurry vision or double vision ENT: epistaxis or sore throat Allergy/Immunology: itchy/watery eyes or nasal congestion Hematologic/Lymphatic: bleeding problems, blood clots or swollen lymph nodes Endocrine: temperature intolerance or unexpected weight changes Breast: new or changing breast lumps or nipple discharge Resp: cough, shortness of breath, or wheezing CV: chest pain or dyspnea on exertion GI: as per HPI GU: dysuria, trouble voiding, or hematuria MSK: joint pain or joint stiffness Neuro: TIA or stroke symptoms Derm: pruritus and skin lesion changes Psych: anxiety and depression  PE Blood pressure (!) 148/73, pulse 69, temperature 98.1 F (36.7 C), temperature source Oral, resp. rate 18, height _0  (1.626 m), weight 82.1 kg, SpO2 97 %. Constitutional: NAD; conversant Eyes: Moist conjunctiva Lungs: Normal respiratory effort CV: RRR; no pitting edema MSK: Normal range of motion of extremities Psychiatric: Appropriate affect; alert and oriented x3  Results for orders placed or performed during the hospital encounter of 02/04/22 (from the past 48 hour(s))  I-STAT, chem 8     Status: Abnormal   Collection Time: 02/04/22 10:24 AM  Result Value Ref Range   Sodium 134 (L) 135 - 145 mmol/L   Potassium 4.5 3.5 - 5.1 mmol/L   Chloride 104 98 - 111 mmol/L   BUN 12 6 - 20 mg/dL   Creatinine, Ser 0.30 (L) 0.44 - 1.00 mg/dL   Glucose, Bld 220 (H) 70 - 99 mg/dL    Comment: Glucose reference range applies only to samples taken after fasting for at least 8 hours.   Calcium, Ion 0.99 (L) 1.15 - 1.40 mmol/L   TCO2 21 (L) 22 - 32 mmol/L   Hemoglobin 12.9 12.0 -  15.0 g/dL   HCT 38.0 36.0 - 46.0 %    No results found.   A/P: MISSEY HASLEY is an 51 y.o. 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 The pathophysiology of perianal condyloma ([place]) was discussed at length with associated pictures and illustrations. -With her history of prior vaginal warts, certainly suspicious for condyloma.  -We have reviewed options going forward including surgery to remove the lesions and obtain pathology of these as well. If this is in fact condyloma, discussed that this is generally caused  by the human papilloma virus which represents a sexually transmitted infection. -Encouraged her to discuss Pap smear with her PCP at her planned July 11 follow-up. -Previous check for HIV negative 07/2019  -The planned procedures, material risks (including, but not limited to, pain, bleeding, infection, scarring, need for blood transfusion, damage to anal sphincter, incontinence of gas and/or stool, need for additional procedures, anal stenosis, rare cases of pelvic sepsis which in severe cases may require things like a colostomy, recurrence, pneumonia, heart attack, stroke, death) benefits and alternatives to surgery were discussed at length. I noted a good probability that the procedure would help improve their symptoms. The patient's questions were answered to her satisfaction, she voiced understanding and elected to proceed with surgery. Additionally, we discussed typical postoperative expectations and the recovery process.   Nadeen Landau, [facility], [place]

## 2022-02-04 NOTE — Anesthesia Preprocedure Evaluation (Addendum)
Anesthesia Evaluation  Patient identified by MRN, date of birth, ID band Patient awake    Reviewed: Allergy & Precautions, NPO status , Patient's Chart, lab work & pertinent test results, reviewed documented beta blocker date and time   Airway Mallampati: II  TM Distance: >3 FB Neck ROM: Full    Dental  (+) Dental Advisory Given, Chipped,    Pulmonary asthma , Current SmokerPatient did not abstain from smoking.,    Pulmonary exam normal breath sounds clear to auscultation       Cardiovascular hypertension, Pt. on home beta blockers and Pt. on medications negative cardio ROS Normal cardiovascular exam Rhythm:Regular Rate:Normal     Neuro/Psych  Headaches, PSYCHIATRIC DISORDERS Anxiety Depression    GI/Hepatic GERD  ,(+) Cirrhosis     substance abuse  alcohol use,   Endo/Other  diabetes, Type 2, Oral Hypoglycemic Agents  Renal/GU negative Renal ROS  negative genitourinary   Musculoskeletal negative musculoskeletal ROS (+)   Abdominal   Peds  Hematology negative hematology ROS (+)   Anesthesia Other Findings   Reproductive/Obstetrics                            Anesthesia Physical Anesthesia Plan  ASA: 3  Anesthesia Plan: General   Post-op Pain Management: Tylenol PO (pre-op)*   Induction: Intravenous  PONV Risk Score and Plan: 2 and Ondansetron, Dexamethasone and Midazolam  Airway Management Planned: LMA  Additional Equipment:   Intra-op Plan:   Post-operative Plan: Extubation in OR  Informed Consent: I have reviewed the patients History and Physical, chart, labs and discussed the procedure including the risks, benefits and alternatives for the proposed anesthesia with the patient or authorized representative who has indicated his/her understanding and acceptance.     Dental advisory given  Plan Discussed with: CRNA  Anesthesia Plan Comments:         Anesthesia  Quick Evaluation

## 2022-02-04 NOTE — Transfer of Care (Signed)
Immediate Anesthesia Transfer of Care Note  Patient: Alexandria Leblanc  Procedure(s) Performed: ANORECTAL EXAM UNDER ANESTHESIA (Rectum) EXCISION OF PERIANAL LESION x2 (Rectum)  Patient Location: PACU  Anesthesia Type:General  Level of Consciousness: awake, alert  and oriented  Airway & Oxygen Therapy: Patient Spontanous Breathing  Post-op Assessment: Report given to RN and Post -op Vital signs reviewed and stable  Post vital signs: Reviewed and stable  Last Vitals:  Vitals Value Taken Time  BP 124/68 02/04/22 1148  Temp 36.7 C 02/04/22 1148  Pulse 60 02/04/22 1149  Resp 14 02/04/22 1149  SpO2 92 % 02/04/22 1149  Vitals shown include unvalidated device data.  Last Pain:  Vitals:   02/04/22 0959  TempSrc: Oral      Patients Stated Pain Goal: 5 (18/40/37 5436)  Complications: No notable events documented.

## 2022-02-04 NOTE — Anesthesia Procedure Notes (Signed)
Procedure Name: LMA Insertion Date/Time: 02/04/2022 11:10 AM  Performed by: Clearnce Sorrel, CRNAPre-anesthesia Checklist: Patient identified, Emergency Drugs available, Suction available and Patient being monitored Patient Re-evaluated:Patient Re-evaluated prior to induction Oxygen Delivery Method: Circle System Utilized Preoxygenation: Pre-oxygenation with 100% oxygen Induction Type: IV induction Ventilation: Mask ventilation without difficulty LMA: LMA inserted LMA Size: 4.0 Number of attempts: 1 Airway Equipment and Method: Bite block Placement Confirmation: positive ETCO2 Tube secured with: Tape Dental Injury: Teeth and Oropharynx as per pre-operative assessment

## 2022-02-05 ENCOUNTER — Encounter (HOSPITAL_BASED_OUTPATIENT_CLINIC_OR_DEPARTMENT_OTHER): Payer: Self-pay | Admitting: Surgery

## 2022-02-05 LAB — SURGICAL PATHOLOGY

## 2022-02-05 NOTE — Anesthesia Postprocedure Evaluation (Signed)
Anesthesia Post Note  Patient: Alexandria Leblanc  Procedure(s) Performed: ANORECTAL EXAM UNDER ANESTHESIA (Rectum) EXCISION OF PERIANAL LESION x2 (Rectum)     Patient location during evaluation: PACU Anesthesia Type: General Level of consciousness: awake and alert Pain management: pain level controlled Vital Signs Assessment: post-procedure vital signs reviewed and stable Respiratory status: spontaneous breathing, nonlabored ventilation, respiratory function stable and patient connected to nasal cannula oxygen Cardiovascular status: blood pressure returned to baseline and stable Postop Assessment: no apparent nausea or vomiting Anesthetic complications: no   No notable events documented.  Last Vitals:  Vitals:   02/04/22 1230 02/04/22 1309  BP: 115/69 115/65  Pulse: (!) 57 (!) 55  Resp: 15 14  Temp: 36.7 C   SpO2: 97% 95%    Last Pain:  Vitals:   02/04/22 1309  TempSrc:   PainSc: 0-No pain                 Faron Tudisco L Hemi Chacko

## 2022-02-08 ENCOUNTER — Other Ambulatory Visit: Payer: Self-pay | Admitting: Primary Care

## 2022-02-08 DIAGNOSIS — A6000 Herpesviral infection of urogenital system, unspecified: Secondary | ICD-10-CM

## 2022-03-03 ENCOUNTER — Other Ambulatory Visit: Payer: Self-pay | Admitting: Primary Care

## 2022-03-03 DIAGNOSIS — E1165 Type 2 diabetes mellitus with hyperglycemia: Secondary | ICD-10-CM

## 2022-03-10 ENCOUNTER — Ambulatory Visit: Payer: Managed Care, Other (non HMO) | Admitting: Primary Care

## 2022-03-10 NOTE — Telephone Encounter (Signed)
Unfortunately no - [employer] removed albuterol from their patient assistance program. The only discount option I'm aware of is the [facility] - albuterol HFA is available for $35 per inhaler, patient would have to enroll in the [facility] program online: [email or web address]

## 2022-03-11 ENCOUNTER — Other Ambulatory Visit: Payer: Self-pay | Admitting: Primary Care

## 2022-03-11 DIAGNOSIS — R519 Headache, unspecified: Secondary | ICD-10-CM

## 2022-03-11 DIAGNOSIS — I10 Essential (primary) hypertension: Secondary | ICD-10-CM

## 2022-03-18 ENCOUNTER — Other Ambulatory Visit: Payer: Self-pay | Admitting: Primary Care

## 2022-03-18 DIAGNOSIS — J4540 Moderate persistent asthma, uncomplicated: Secondary | ICD-10-CM

## 2022-03-29 ENCOUNTER — Encounter (INDEPENDENT_AMBULATORY_CARE_PROVIDER_SITE_OTHER): Payer: Self-pay

## 2022-05-11 ENCOUNTER — Other Ambulatory Visit: Payer: 59 | Admitting: Primary Care

## 2022-05-11 DIAGNOSIS — E1165 Type 2 diabetes mellitus with hyperglycemia: Secondary | ICD-10-CM

## 2022-05-12 NOTE — Telephone Encounter (Signed)
Unable to reach patient. Left voicemail to return call to our office.   

## 2022-05-12 NOTE — Telephone Encounter (Signed)
Called and scheduled patient for appointment 05/20/22. Verified she now has [employer].

## 2022-05-12 NOTE — Telephone Encounter (Signed)
Please call patient:  She is overdue for a diabetes follow up. Needs to be seen. Can we get her scheduled? She mentioned that she didn't have insurance a few months ago. Is there anyway we can help? [facility] discount program?

## 2022-05-17 ENCOUNTER — Ambulatory Visit: Payer: Managed Care, Other (non HMO)

## 2022-05-20 ENCOUNTER — Encounter: Payer: Self-pay | Admitting: Primary Care

## 2022-05-20 ENCOUNTER — Other Ambulatory Visit: Payer: Self-pay | Admitting: Primary Care

## 2022-05-20 ENCOUNTER — Ambulatory Visit (INDEPENDENT_AMBULATORY_CARE_PROVIDER_SITE_OTHER): Payer: 59 | Admitting: Primary Care

## 2022-05-20 VITALS — BP 118/74 | HR 60 | Temp 97.2°F | Ht 64.0 in | Wt 190.0 lb

## 2022-05-20 DIAGNOSIS — J309 Allergic rhinitis, unspecified: Secondary | ICD-10-CM | POA: Diagnosis not present

## 2022-05-20 DIAGNOSIS — E1165 Type 2 diabetes mellitus with hyperglycemia: Secondary | ICD-10-CM | POA: Diagnosis not present

## 2022-05-20 DIAGNOSIS — Z23 Encounter for immunization: Secondary | ICD-10-CM | POA: Diagnosis not present

## 2022-05-20 DIAGNOSIS — J4540 Moderate persistent asthma, uncomplicated: Secondary | ICD-10-CM

## 2022-05-20 DIAGNOSIS — J302 Other seasonal allergic rhinitis: Secondary | ICD-10-CM

## 2022-05-20 LAB — POCT GLYCOSYLATED HEMOGLOBIN (HGB A1C): Hemoglobin A1C: 8.3 % — AB (ref 4.0–5.6)

## 2022-05-20 LAB — MICROALBUMIN / CREATININE URINE RATIO
Creatinine,U: 72.6 mg/dL
Microalb Creat Ratio: 29.6 mg/g (ref 0.0–30.0)
Microalb, Ur: 21.5 mg/dL — ABNORMAL HIGH (ref 0.0–1.9)

## 2022-05-20 MED ORDER — OZEMPIC (0.25 OR 0.5 MG/DOSE) 2 MG/1.5ML ~~LOC~~ SOPN: PEN_INJECTOR | SUBCUTANEOUS | 0 refills | Status: DC

## 2022-05-20 MED ORDER — FLUTICASONE-SALMETEROL 250-50 MCG/ACT IN AEPB: 1.0000 | INHALATION_SPRAY | Freq: Two times a day (BID) | RESPIRATORY_TRACT | 3 refills | Status: DC

## 2022-05-20 NOTE — Progress Notes (Signed)
Subjective:    Patient ID: Alexandria Leblanc, female    DOB: 05-05-1971, 51 y.o.   MRN: [id number]  HPI  Alexandria Leblanc is a very pleasant 51 y.o. female with a history of hypertension, type 2 diabetes, alcoholic cirrhosis of liver, thrombocytopenia, frequent headaches, genital herpes, tobacco use, asthma who presents today for follow-up of diabetes and to discuss nasal congestion and depression.   1) Type 2 Diabetes:  Current medications include: Glipizide XL 10 mg daily, semaglutide 0.5 mg weekly, metformin 1000 mg twice daily. She has not been taking Ozempic as the pharmacy stated it was "hard to find".   She is checking her blood glucose 0 times daily.  Last A1C: 8.0 in July 2023, 8.3 today Last Eye Exam: Up-to-date Last Foot Exam: Due Pneumonia Vaccination: Up-to-date Urine Microalbumin: Due Statin: Pravastatin  Dietary changes since last visit: Smaller portion sizes, mostly home cooked meals.    Exercise: No regular exercise.   2) Nasal Congestion: Symptom onset two weeks ago with nasal congestion. When outside in the cold air, she has no nasal congestion, but as soon as she comes indoors, her nasal pressure will return. Also with sneezing.   She's been taking OTC "allergy pill", OTC "sinus pressure and headaches" and a nasal spray. She is compliant to her Singulair 10 mg HS.   She denies fevers, chills, increased fatigue. She doesn't feel sick.   3) Asthma: Chronic. Currently managed on Advair 100-50 mcg, 1 puff twice daily. Initially, this regimen helped her symptoms, but over the last few months she's noticed chest tightness, shortness of breath, cough, and wheezing. Symptoms occur during the day and evening.   Review of Systems  Respiratory:  Negative for shortness of breath.   Cardiovascular:  Negative for chest pain.  Musculoskeletal:  Positive for myalgias.  Neurological:  Negative for numbness.         Past Medical History:  Diagnosis Date   Alcohol  abuse    Anxiety    Arthritis    Asthma    Carpal tunnel syndrome, bilateral    Depression    DJD (degenerative joint disease)    lower back   Gallstones    Genital warts    GERD (gastroesophageal reflux disease)    Hepatic cirrhosis (HCC)    Hypertension    Migraines    Obesity    Portal hypertension with esophageal varices (HCC)    Splenomegaly    Thrombocytopenia (HCC)    Type 2 diabetes mellitus (HCC)     Social History   Socioeconomic History   Marital status: Single    Spouse name: Not on file   Number of children: 0   Years of education: Not on file   Highest education level: Not on file  Occupational History   Occupation: Scientist, water quality  Tobacco Use   Smoking status: Every Day    Packs/day: 1.00    Years: 35.00    Total pack years: 35.00    Types: Cigarettes   Smokeless tobacco: Never  Vaping Use   Vaping Use: Never used  Substance and Sexual Activity   Alcohol use: Yes    Alcohol/week: 35.0 standard drinks of alcohol    Types: 35 Cans of beer per week    Comment: 4 cans of beer 2x week   Drug use: No   Sexual activity: Not on file  Other Topics Concern   Not on file  Social History Narrative   Single; No children.; Works as a  Machinist in a [employer] Highest level of education GED; lives with mother. 1and half a day- smoking; 5 beers a week; no hard liqor.           Social Determinants of Health   Financial Resource Strain: Not on file  Food Insecurity: Not on file  Transportation Needs: Not on file  Physical Activity: Not on file  Stress: Not on file  Social Connections: Not on file  Intimate Partner Violence: Not on file    Past Surgical History:  Procedure Laterality Date   BIOPSY  10/22/2021   Procedure: BIOPSY;  Surgeon: Sharyn Creamer, MD;  Location: [facility];  Service: Gastroenterology;;  EGD and COLON   COLONOSCOPY WITH PROPOFOL N/A 10/22/2021   Procedure: COLONOSCOPY WITH PROPOFOL;  Surgeon: Sharyn Creamer, MD;  Location: [facility];  Service: Gastroenterology;  Laterality: N/A;   ESOPHAGOGASTRODUODENOSCOPY (EGD) WITH PROPOFOL N/A 10/22/2021   Procedure: ESOPHAGOGASTRODUODENOSCOPY (EGD) WITH PROPOFOL;  Surgeon: Sharyn Creamer, MD;  Location: [facility];  Service: Gastroenterology;  Laterality: N/A;   LESION REMOVAL N/A 02/04/2022   Procedure: EXCISION OF PERIANAL LESION x2;  Surgeon: Ileana Roup, MD;  Location: [facility];  Service: General;  Laterality: N/A;   POLYPECTOMY  10/22/2021   Procedure: POLYPECTOMY;  Surgeon: Sharyn Creamer, MD;  Location: [facility];  Service: Gastroenterology;;   RECTAL EXAM UNDER ANESTHESIA N/A 02/04/2022   Procedure: ANORECTAL EXAM UNDER ANESTHESIA;  Surgeon: Ileana Roup, MD;  Location: [facility];  Service: General;  Laterality: N/A;    Family History  Problem Relation Age of Onset   Heart disease Mother    Hypertension Mother    Dementia Mother    Diabetes Mother    Heart attack Mother    COPD Father    Hypertension Father    Alzheimer's disease Sister    Hyperlipidemia Brother    Diabetes Brother    Prostate cancer Paternal Uncle    Colon cancer Paternal Uncle     Allergies  Allergen Reactions   Penicillins Hives and Itching   Sulfa Antibiotics Hives    Current Outpatient Medications on File Prior to Visit  Medication Sig Dispense Refill   acyclovir (ZOVIRAX) 400 MG tablet Take 1 tablet (400 mg total) by mouth 2 (two) times daily. For herpes prevention. 180 tablet 2   albuterol (VENTOLIN HFA) 108 (90 Base) MCG/ACT inhaler INHALE 2 PUFFS INTO LUNGS EVERY 6 HOURS AS NEEDED FOR WHEEZING OR SHORTNESS OF BREATH 6.7 g 0   Blood Glucose Monitoring Suppl (FREESTYLE LITE) w/Device KIT 1 each by Does not apply route as directed. DX E11.9 1 kit 0   glipiZIDE (GLUCOTROL XL) 10 MG 24 hr tablet TAKE 1 TABLET BY MOUTH ONCE DAILY WITH BREAKFAST FOR  DIABETES 30 tablet 0   glucose blood test strip Use to test blood sugars up to  three times a day Dx E11.9 200 each 3   hydrochlorothiazide (HYDRODIURIL) 25 MG tablet TAKE 1 TABLET BY MOUTH ONCE A DAY AS NEEDED FOR BLOOD PRESSURE (Patient taking differently: daily.) 90 tablet 2   Lancets (FREESTYLE) lancets 1 each by Other route 3 (three) times daily. 100 each 2   metFORMIN (GLUCOPHAGE) 1000 MG tablet TAKE 1 TABLET BY MOUTH TWICE A DAY FOR DIABETES 180 tablet 0   montelukast (SINGULAIR) 10 MG tablet TAKE 1 TABLET BY MOUTH EVERY NIGHT AT BEDTIME FOR ALLERGIES AND ASTHMA 90 tablet 2   olmesartan (BENICAR) 40 MG tablet TAKE  1 TABLET BY MOUTH ONCE A DAY FOR BLOOD PRESSURE 90 tablet 1   pravastatin (PRAVACHOL) 40 MG tablet Take 1 tablet (40 mg total) by mouth daily. For cholesterol (Patient taking differently: Take 40 mg by mouth at bedtime. For cholesterol) 90 tablet 2   propranolol ER (INDERAL LA) 80 MG 24 hr capsule TAKE 1 CAPSULE BY MOUTH EVERY NIGHT AT BEDTIME FOR HEADACHE PREVENTION 90 capsule 1   SUMAtriptan (IMITREX) 50 MG tablet Take 1 tablet (50 mg total) by mouth every 2 (two) hours as needed for migraine. Take 1 tablet by mouth at migraine onset. May repeat in 2 hours if headache persists or recurs. 10 tablet 0   ondansetron (ZOFRAN) 4 MG tablet Take 1 tablet (4 mg total) by mouth every 8 (eight) hours as needed for nausea or vomiting. (Patient not taking: Reported on 05/20/2022) 20 tablet 0   pantoprazole (PROTONIX) 40 MG tablet Take 1 tablet (40 mg total) by mouth 2 (two) times daily before a meal. 60 tablet 1   No current facility-administered medications on file prior to visit.    BP 118/74   Pulse 60   Temp (!) 97.2 F (36.2 C) (Temporal)   Ht 5' 4" (1.626 m)   Wt 190 lb (86.2 kg)   LMP  (LMP Unknown)   SpO2 99%   BMI 32.61 kg/m  Objective:   Physical Exam Cardiovascular:     Rate and Rhythm: Normal rate and regular rhythm.  Pulmonary:     Effort: Pulmonary effort is normal.     Breath sounds: Normal breath sounds.  Musculoskeletal:     Cervical  back: Neck supple.  Skin:    General: Skin is warm and dry.  Psychiatric:        Mood and Affect: Mood normal.           Assessment & Plan:   Problem List Items Addressed This Visit       Respiratory   Asthma, moderate persistent    Deteriorated.   Increase Advair to 250-50 mcg, 1 puff BID. Continue albuterol inhaler PRN and Singulair 10 mg HS.  She will update if no improvement.       Relevant Medications   fluticasone-salmeterol (ADVAIR) 250-50 MCG/ACT AEPB   Allergic rhinitis    Suspect symptoms today are allergy induced. She does not appear acutely ill.   Question if she is using OTC Afrin which can cause rebound symptoms. She will send me a message once she gets home with the name of her nasal spray.   Consider switching to Xyzal 5 mg daily.          Endocrine   Type 2 diabetes mellitus with hyperglycemia ([place]) - Primary    Uncontrolled with A1C of 8.3 today.  Continue Glipizide 10 mg BID, metformin 1000 mg BID. Add Ozempic 0.25 mg into the skin once weekly for 4 weeks, then increase to 0.5 mg once weekly thereafter.  Foot exam today. Urine microalbumin due and pending.  Follow up in 3 months.      Relevant Medications   Semaglutide,0.25 or 0.5MG/DOS, (OZEMPIC, 0.25 OR 0.5 MG/DOSE,) 2 MG/1.5ML SOPN   Other Relevant Orders   Microalbumin/Creatinine Ratio, Urine   POCT glycosylated hemoglobin (Hb A1C) (Completed)   Other Visit Diagnoses     Need for immunization against influenza       Relevant Orders   Flu Vaccine QUAD 54moIM (Fluarix, Fluzone & Alfiuria Quad PF) (Completed)  Pleas Koch, NP

## 2022-05-20 NOTE — Assessment & Plan Note (Addendum)
Uncontrolled with A1C of 8.3 today.  Continue Glipizide 10 mg BID, metformin 1000 mg BID. Add Ozempic 0.25 mg into the skin once weekly for 4 weeks, then increase to 0.5 mg once weekly thereafter.  Foot exam today. Urine microalbumin due and pending.  Follow up in 3 months.

## 2022-05-20 NOTE — Assessment & Plan Note (Signed)
Deteriorated.   Increase Advair to 250-50 mcg, 1 puff BID. Continue albuterol inhaler PRN and Singulair 10 mg HS.  She will update if no improvement.

## 2022-05-20 NOTE — Patient Instructions (Signed)
We increased the dose of your Advair inhaler to 250-50 mcg.  Inhale 1 puff into the lungs twice daily for asthma.  Please send me a picture of the nasal spray and allergy pill you are taking.  Start Ozempic for diabetes.  Inject 0.25 mg into the skin once weekly for 4 weeks, then increase to 0.5 mg once weekly thereafter for diabetes.   Please schedule a follow up visit for 3 months.  It was a pleasure to see you today!

## 2022-05-20 NOTE — Assessment & Plan Note (Signed)
Suspect symptoms today are allergy induced. She does not appear acutely ill.   Question if she is using OTC Afrin which can cause rebound symptoms. She will send me a message once she gets home with the name of her nasal spray.   Consider switching to Xyzal 5 mg daily.

## 2022-05-21 ENCOUNTER — Other Ambulatory Visit (HOSPITAL_COMMUNITY): Payer: Self-pay

## 2022-05-26 ENCOUNTER — Inpatient Hospital Stay: Admission: RE | Admit: 2022-05-26 | Payer: 59 | Source: Ambulatory Visit

## 2022-05-27 ENCOUNTER — Other Ambulatory Visit: Payer: Self-pay | Admitting: Primary Care

## 2022-05-27 DIAGNOSIS — E1165 Type 2 diabetes mellitus with hyperglycemia: Secondary | ICD-10-CM

## 2022-05-28 ENCOUNTER — Encounter: Payer: Self-pay | Admitting: Primary Care

## 2022-05-28 ENCOUNTER — Telehealth: Payer: Self-pay | Admitting: Primary Care

## 2022-05-28 ENCOUNTER — Other Ambulatory Visit: Payer: Self-pay | Admitting: Primary Care

## 2022-05-28 DIAGNOSIS — K219 Gastro-esophageal reflux disease without esophagitis: Secondary | ICD-10-CM

## 2022-05-28 MED ORDER — OMEPRAZOLE 20 MG PO CPDR: 20.0000 mg | DELAYED_RELEASE_CAPSULE | Freq: Every day | ORAL | 0 refills | Status: DC

## 2022-05-28 NOTE — Telephone Encounter (Signed)
Patient called in and would like to know why medication omeprazole was denied at the pharmacy after prior authorization was sent over?

## 2022-05-28 NOTE — Telephone Encounter (Signed)
Looks like omeprazole is not on her medication list. Pantoprazole is on her medication list now.  Will send in omeprazole, please read her this message next week.

## 2022-06-01 NOTE — Telephone Encounter (Signed)
Patient notified, she has picked up the prescription from the pharmacy.

## 2022-06-02 DIAGNOSIS — E1165 Type 2 diabetes mellitus with hyperglycemia: Secondary | ICD-10-CM

## 2022-06-03 MED ORDER — TIRZEPATIDE 2.5 MG/0.5ML ~~LOC~~ SOAJ: 2.5000 mg | SUBCUTANEOUS | 0 refills | Status: DC

## 2022-06-04 ENCOUNTER — Other Ambulatory Visit: Payer: Self-pay | Admitting: Primary Care

## 2022-06-04 DIAGNOSIS — J4540 Moderate persistent asthma, uncomplicated: Secondary | ICD-10-CM

## 2022-06-04 NOTE — Telephone Encounter (Signed)
Rx sent to pharmacy earlier.

## 2022-06-04 NOTE — Telephone Encounter (Signed)
From: Micah Flesher To: Office of Pleas Koch, NP Sent: 06/04/2022 1:40 PM EST Subject: Medication Renewal Request  Refills have been requested for the following medications:   albuterol (VENTOLIN HFA) 108 (90 Base) MCG/ACT inhaler [Baleigh Rennaker K Betania Dizon]  Preferred pharmacy: [place], [place] Delivery method: [employer]

## 2022-06-08 ENCOUNTER — Other Ambulatory Visit: Payer: Self-pay | Admitting: Primary Care

## 2022-06-08 ENCOUNTER — Ambulatory Visit: Admission: RE | Admit: 2022-06-08 | Payer: 59 | Source: Ambulatory Visit

## 2022-06-08 DIAGNOSIS — E1165 Type 2 diabetes mellitus with hyperglycemia: Secondary | ICD-10-CM

## 2022-06-09 ENCOUNTER — Other Ambulatory Visit (HOSPITAL_COMMUNITY): Payer: Self-pay

## 2022-06-09 ENCOUNTER — Telehealth: Payer: Self-pay

## 2022-06-09 NOTE — Telephone Encounter (Signed)
Pharmacy Patient Advocate Encounter   Received notification from [place] that prior authorization for [facility] 2.'5MG'$ /0.5ML pen-injectors is required/requested.  Per Test Claim: Plan/benefit exclusion, Product not formulary   PA submitted on 06/09/22 to (ins) [employer] via CoverMyMeds Key [id number] Status is pending

## 2022-06-09 NOTE — Telephone Encounter (Signed)
ERROR

## 2022-06-11 ENCOUNTER — Other Ambulatory Visit: Payer: Self-pay | Admitting: Primary Care

## 2022-06-11 ENCOUNTER — Other Ambulatory Visit (HOSPITAL_COMMUNITY): Payer: Self-pay

## 2022-06-11 DIAGNOSIS — E1165 Type 2 diabetes mellitus with hyperglycemia: Secondary | ICD-10-CM

## 2022-06-11 MED ORDER — TRULICITY 0.75 MG/0.5ML ~~LOC~~ SOAJ: 0.7500 mg | SUBCUTANEOUS | 0 refills | Status: DC

## 2022-06-11 NOTE — Telephone Encounter (Signed)
Please call patient:  Let her know that her insurance would not cover Mounjaro.  I know that she cannot get Ozempic in stock so we can try Trulicity.  It is a once weekly injectable medication very similar to Ozempic and Mounjaro.  Recommend 0.75 mg weekly until we meet up again.  I will send this to her pharmacy now.  Please have her notify us if she runs into any trouble.

## 2022-06-11 NOTE — Telephone Encounter (Signed)
Pharmacy Patient Advocate Encounter  Received notification from [place] that the request for prior authorization for Mounjaro 2.'5MG'$ /0.5ML pen-injectors has been denied due to not meeting prior authorization requirements.      You may call [phone] or fax [phone], to appeal.  Please be advised we currently do not have a Pharmacist to review denials. If you would like [place] to submit it on your behalf, please provide clinical information to support your reason for appeal and any pertinent information you would like [place] to include with the appeal request. Appeals may take longer 5 business days to be submitted as we prepares necessary documentation. Thanks for your support.  How would you like to proceed?  Key: [id number]

## 2022-06-11 NOTE — Telephone Encounter (Signed)
Patient has been notified. She will let us know if she has issues getting the Trulicity.

## 2022-06-16 ENCOUNTER — Telehealth: Payer: Self-pay | Admitting: Internal Medicine

## 2022-06-16 NOTE — Telephone Encounter (Signed)
Called patient to rearrange appointments she let me know that she no longer has insurance and would like to cancel her appointments. Appointments have been cancelled- thank you

## 2022-06-29 ENCOUNTER — Other Ambulatory Visit: Payer: Managed Care, Other (non HMO)

## 2022-07-06 ENCOUNTER — Ambulatory Visit: Payer: Managed Care, Other (non HMO) | Admitting: Internal Medicine

## 2022-08-04 ENCOUNTER — Other Ambulatory Visit: Payer: Self-pay | Admitting: Primary Care

## 2022-08-04 DIAGNOSIS — R519 Headache, unspecified: Secondary | ICD-10-CM

## 2022-08-04 DIAGNOSIS — J4540 Moderate persistent asthma, uncomplicated: Secondary | ICD-10-CM

## 2022-08-04 DIAGNOSIS — I10 Essential (primary) hypertension: Secondary | ICD-10-CM

## 2022-08-19 ENCOUNTER — Ambulatory Visit: Payer: Self-pay | Admitting: Primary Care

## 2022-08-19 ENCOUNTER — Telehealth: Payer: Self-pay | Admitting: Primary Care

## 2022-08-19 ENCOUNTER — Encounter: Payer: Self-pay | Admitting: Primary Care

## 2022-08-19 VITALS — BP 150/82 | HR 67 | Temp 98.7°F | Ht 64.0 in | Wt 190.0 lb

## 2022-08-19 DIAGNOSIS — J4540 Moderate persistent asthma, uncomplicated: Secondary | ICD-10-CM

## 2022-08-19 DIAGNOSIS — K7030 Alcoholic cirrhosis of liver without ascites: Secondary | ICD-10-CM

## 2022-08-19 DIAGNOSIS — K219 Gastro-esophageal reflux disease without esophagitis: Secondary | ICD-10-CM

## 2022-08-19 DIAGNOSIS — R519 Headache, unspecified: Secondary | ICD-10-CM

## 2022-08-19 DIAGNOSIS — J309 Allergic rhinitis, unspecified: Secondary | ICD-10-CM

## 2022-08-19 DIAGNOSIS — E1165 Type 2 diabetes mellitus with hyperglycemia: Secondary | ICD-10-CM

## 2022-08-19 DIAGNOSIS — A6000 Herpesviral infection of urogenital system, unspecified: Secondary | ICD-10-CM

## 2022-08-19 DIAGNOSIS — E785 Hyperlipidemia, unspecified: Secondary | ICD-10-CM

## 2022-08-19 LAB — POCT GLYCOSYLATED HEMOGLOBIN (HGB A1C): Hemoglobin A1C: 10.1 % — AB (ref 4.0–5.6)

## 2022-08-19 NOTE — Telephone Encounter (Signed)
Alexandria Leblanc, this patient is not a [facility] patient now as she does not have insurance. Is there any sort of patient assistance we can do for her for Breo inhaler and Trulicity?  Uncontrolled diabetes and asthma.

## 2022-08-19 NOTE — Assessment & Plan Note (Signed)
Controlled.  Continue Acyclovir 400 mg BID.

## 2022-08-19 NOTE — Telephone Encounter (Signed)
She can apply for [facility] assistance, she may qualify if household income is < 300% FPL ($45,180 for 1 person).  I can print the forms for her.  Trulicity is still not accepting any patient assistance. If you want to switch to Ozempic, they will accept applications from uninsured patients (their income limit is higher, 400% FPL or $60,240 for 1 person). I can apply online for her if you want to go this route.

## 2022-08-19 NOTE — Assessment & Plan Note (Signed)
Improved.  Continue propranolol ER 80 mg daily and sumatriptan 50 mg PRN.

## 2022-08-19 NOTE — Patient Instructions (Signed)
Please work on [employer].  I will be in touch once I hear back from my pharmacist.   I will also reach out to Sarah for lung cancer screening.  Please schedule a follow up visit for 3 months.  It was a pleasure to see you today!

## 2022-08-19 NOTE — Assessment & Plan Note (Signed)
Repeat lipid panel next visit. Continue pravastatin 40 mg daily.

## 2022-08-19 NOTE — Telephone Encounter (Signed)
Attempted to reach patient to discuss options, unable to reach, left VM to call back.

## 2022-08-19 NOTE — Assessment & Plan Note (Signed)
Deteriorated.  Will work with our pharmacist for patient assistance for [facility] as she did well on this in the past.  Continue Advair 250-50 mcg BID Continue Singulair 10 mg HS Continue albuterol inhaler PRN.  Consider referral to pulmonology.

## 2022-08-19 NOTE — Telephone Encounter (Signed)
Spoke with patient, she agrees to pursue patient assistance. She will come to office to fill out Breo PAP forms - placed in front office for patient to fill out.  I applied with patient through online portal for Ozempic 0.5 mg dose (nearly equivalent to Trulicity [id number] mg dose), we will await determination.

## 2022-08-19 NOTE — Assessment & Plan Note (Signed)
Following with GI, relapsed with alcohol consumption.  Recommended she stop drinking alcohol.   Follow up with GI as scheduled.

## 2022-08-19 NOTE — Assessment & Plan Note (Signed)
Uncontrolled.  Continue Singulair 10 mg HS. Add Zyrtec 10 mg.  Resume Flonase BID.

## 2022-08-19 NOTE — Progress Notes (Signed)
Subjective:    Patient ID: Alexandria Leblanc, female    DOB: 1970/10/10, 52 y.o.   MRN: [id number]  HPI  Alexandria Leblanc is a very pleasant 52 y.o. female with a history of hypertension, asthma, cirrhosis of liver, type 2 diabetes, frequent headaches, hyperlipidemia, tobacco dependence who presents today for follow up of chronic .  1) Hypertension: Currently managed on HCTZ 25 mg daily, olmesartan 40 mg daily, propranolol ER 80 mg daily for headaches.  She denies chest pain. Headaches have improved.   BP Readings from Last 3 Encounters:  08/19/22 (!) 150/82  05/20/22 118/74  02/04/22 115/65     2) Type 2 Diabetes:   Current medications include: Trulicity A999333 mg weekly, Glipizide XL 10 mg daily, metformin 1000 mg BID.   She does experience 2-3 episodes of diarrhea daily.   She has not had Trulicity in months due to loss of insurance.   She is checking her blood glucose 0 times daily.  Last A1C: 8.3 in December 2023, 10.1 Last Eye Exam: Due Last Foot Exam: UTD Pneumonia Vaccination: 2017 Urine Microalbumin: UTD Statin: pravastatin   Dietary changes since last visit: Small portions. Processed food, packaged food.    Exercise: None  3) Frequent Headaches/Migraines: Currently managed on propranolol ER 80 mg daily for prevention and sumatriptan 50 mg PRN.   Headaches are overall less severe. Most of her headaches lately are secondary to allergies. She's not had to use Sumatriptan in quite sometime.   4) Cirrhosis: Following with GI. She is due for follow up later this Summer. She resumed alcohol consumption several months ago. She is drinking beer, 24 oz can - 5-6 cans, a few times weekly.   5) Asthma: Currently managed on Advair 250-50 mcg BID, Singulair 10 mg HS, and albuterol inhaler PRN.   Overall she feels that her asthma is worse. She experiences chest heaviness with shortness of breath with walking down her driveway. She is using her albuterol inhaler 3 times  weekly.   She did well on Breo inhaler but this became cost prohibitive. She had to cancel her lung cancer screening due to lack of insurance coverage.   Review of Systems  Respiratory:  Positive for shortness of breath.   Gastrointestinal:  Positive for diarrhea.  Allergic/Immunologic: Positive for environmental allergies.  Neurological:  Positive for headaches.  Psychiatric/Behavioral:  Positive for sleep disturbance.          Past Medical History:  Diagnosis Date   Alcohol abuse    Anxiety    Arthritis    Asthma    Carpal tunnel syndrome, bilateral    Depression    DJD (degenerative joint disease)    lower back   Gallstones    Genital warts    GERD (gastroesophageal reflux disease)    Hepatic cirrhosis (HCC)    Hypertension    Migraines    Obesity    Portal hypertension with esophageal varices (HCC)    Splenomegaly    Thrombocytopenia (HCC)    Type 2 diabetes mellitus (HCC)     Social History   Socioeconomic History   Marital status: Single    Spouse name: Not on file   Number of children: 0   Years of education: Not on file   Highest education level: Not on file  Occupational History   Occupation: Scientist, water quality  Tobacco Use   Smoking status: Every Day    Packs/day: 1.00    Years: 35.00    Additional pack years:  0.00    Total pack years: 35.00    Types: Cigarettes   Smokeless tobacco: Never  Vaping Use   Vaping Use: Never used  Substance and Sexual Activity   Alcohol use: Yes    Alcohol/week: 35.0 standard drinks of alcohol    Types: 35 Cans of beer per week    Comment: 4 cans of beer 2x week   Drug use: No   Sexual activity: Not on file  Other Topics Concern   Not on file  Social History Narrative   Single; No children.; 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 1and half a day- smoking; 5 beers a week; no hard liqor.           Social Determinants of Health   Financial Resource Strain: Not on  file  Food Insecurity: Not on file  Transportation Needs: Not on file  Physical Activity: Not on file  Stress: Not on file  Social Connections: Not on file  Intimate Partner Violence: Not on file    Past Surgical History:  Procedure Laterality Date   BIOPSY  10/22/2021   Procedure: BIOPSY;  Surgeon: Sharyn Creamer, MD;  Location: [facility];  Service: Gastroenterology;;  EGD and COLON   COLONOSCOPY WITH PROPOFOL N/A 10/22/2021   Procedure: COLONOSCOPY WITH PROPOFOL;  Surgeon: Sharyn Creamer, MD;  Location: [facility];  Service: Gastroenterology;  Laterality: N/A;   ESOPHAGOGASTRODUODENOSCOPY (EGD) WITH PROPOFOL N/A 10/22/2021   Procedure: ESOPHAGOGASTRODUODENOSCOPY (EGD) WITH PROPOFOL;  Surgeon: Sharyn Creamer, MD;  Location: [facility];  Service: Gastroenterology;  Laterality: N/A;   LESION REMOVAL N/A 02/04/2022   Procedure: EXCISION OF PERIANAL LESION x2;  Surgeon: Ileana Roup, MD;  Location: [facility];  Service: General;  Laterality: N/A;   POLYPECTOMY  10/22/2021   Procedure: POLYPECTOMY;  Surgeon: Sharyn Creamer, MD;  Location: [facility];  Service: Gastroenterology;;   RECTAL EXAM UNDER ANESTHESIA N/A 02/04/2022   Procedure: ANORECTAL EXAM UNDER ANESTHESIA;  Surgeon: Ileana Roup, MD;  Location: [facility];  Service: General;  Laterality: N/A;    Family History  Problem Relation Age of Onset   Heart disease Mother    Hypertension Mother    Dementia Mother    Diabetes Mother    Heart attack Mother    COPD Father    Hypertension Father    Alzheimer's disease Sister    Hyperlipidemia Brother    Diabetes Brother    Prostate cancer Paternal Uncle    Colon cancer Paternal Uncle     Allergies  Allergen Reactions   Penicillins Hives and Itching   Sulfa Antibiotics Hives    Current Outpatient Medications on File Prior to Visit  Medication Sig Dispense Refill   acyclovir (ZOVIRAX) 400 MG tablet Take 1 tablet (400 mg  total) by mouth 2 (two) times daily. For herpes prevention. 180 tablet 2   albuterol (VENTOLIN HFA) 108 (90 Base) MCG/ACT inhaler INHALE 2 PUFFS INTO LUNGS EVERY 6 HOURS AS NEEDED FOR WHEEZING OR SHORTNESS OF BREATH 6.7 g 0   Blood Glucose Monitoring Suppl (FREESTYLE LITE) w/Device KIT 1 each by Does not apply route as directed. DX E11.9 1 kit 0   Dulaglutide (TRULICITY) A999333 0000000 SOPN Inject 0.75 mg into the skin once a week. for diabetes. 6 mL 0   fluticasone (FLONASE) 50 MCG/ACT nasal spray PLACE 1 SPRAY INTO BOTH NOSTRILS 2 TIMESDAILY 16 g 3   fluticasone-salmeterol (  ADVAIR) 250-50 MCG/ACT AEPB Inhale 1 puff into the lungs in the morning and at bedtime. For asthma 180 each 3   glipiZIDE (GLUCOTROL XL) 10 MG 24 hr tablet TAKE 1 TABLET BY MOUTH ONCE DAILY WITH BREAKFAST FOR  DIABETES          ) 90 tablet 1   glucose blood test strip Use to test blood sugars up to three times a day Dx E11.9 200 each 3   hydrochlorothiazide (HYDRODIURIL) 25 MG tablet TAKE 1 TABLET BY MOUTH ONCE A DAY AS NEEDED FOR BLOOD PRESSURE (Patient taking differently: daily.) 90 tablet 2   Lancets (FREESTYLE) lancets 1 each by Other route 3 (three) times daily. 100 each 2   metFORMIN (GLUCOPHAGE) 1000 MG tablet TAKE 1 TABLET BY MOUTH TWICE A DAY FOR DIABETES 180 tablet 0   montelukast (SINGULAIR) 10 MG tablet TAKE ONE TABLET BY MOUTH EVERY NIGHT AT BEDTIME FOR ALLERGIES AND ASTHMA 90 tablet 0   olmesartan (BENICAR) 40 MG tablet TAKE ONE TABLET BY MOUTH ONCE A DAY FOR BLOOD PRESSURE 90 tablet 0   omeprazole (PRILOSEC) 20 MG capsule Take 1 capsule (20 mg total) by mouth daily. For heartburn. 90 capsule 0   ondansetron (ZOFRAN) 4 MG tablet Take 1 tablet (4 mg total) by mouth every 8 (eight) hours as needed for nausea or vomiting. 20 tablet 0   pravastatin (PRAVACHOL) 40 MG tablet Take 1 tablet (40 mg total) by mouth daily. For cholesterol (Patient taking differently: Take 40 mg by mouth at bedtime. For cholesterol) 90 tablet 2    propranolol ER (INDERAL LA) 80 MG 24 hr capsule TAKE ONE CAPSULE BY MOUTH EVERY NIGHT AT BEDTIME FOR HEADACHE PREVENTION 90 capsule 0   SUMAtriptan (IMITREX) 50 MG tablet Take 1 tablet (50 mg total) by mouth every 2 (two) hours as needed for migraine. Take 1 tablet by mouth at migraine onset. May repeat in 2 hours if headache persists or recurs. (Patient not taking: Reported on 08/19/2022) 10 tablet 0   No current facility-administered medications on file prior to visit.    BP (!) 150/82   Pulse 67   Temp 98.7 F (37.1 C) (Temporal)   Ht 5\' 4"  (1.626 m)   Wt 190 lb (86.2 kg)   LMP  (LMP Unknown)   SpO2 96%   BMI 32.61 kg/m  Objective:   Physical Exam Cardiovascular:     Rate and Rhythm: Normal rate and regular rhythm.  Pulmonary:     Effort: Pulmonary effort is normal.     Breath sounds: Normal breath sounds.  Musculoskeletal:     Cervical back: Neck supple.  Skin:    General: Skin is warm and dry.           Assessment & Plan:  Type 2 diabetes mellitus with hyperglycemia, without long-term current use of insulin (HCC) Assessment & Plan: Uncontrolled with A1C of 10.1.  Currently she is without insurance which makes treatment difficult. Will reach out to our pharmacist regarding patient assistance for Trulicity as she did well on this.  Continue Glipizide XL 10 mg daily. Will change to metformin XR 500 mg, take 1000 mg BID.  Discussed to start checking glucose readings. She will work on her diet.   Follow up in 3 months.   Orders: -     POCT glycosylated hemoglobin (Hb [id number])  Alcoholic cirrhosis of liver without ascites (HCC) Assessment & Plan: Following with GI, relapsed with alcohol consumption.  Recommended she stop drinking alcohol.  Follow up with GI as scheduled.    Gastroesophageal reflux disease, unspecified whether esophagitis present Assessment & Plan: Controlled.  Continue omeprazole 20 mg daily.   Moderate persistent asthma without  complication Assessment & Plan: Deteriorated.  Will work with our pharmacist for patient assistance for [facility] as she did well on this in the past.  Continue Advair 250-50 mcg BID Continue Singulair 10 mg HS Continue albuterol inhaler PRN.  Consider referral to pulmonology.    Genital herpes simplex, unspecified site Assessment & Plan: Controlled.  Continue Acyclovir 400 mg BID.    Hyperlipidemia, unspecified hyperlipidemia type Assessment & Plan: Repeat lipid panel next visit. Continue pravastatin 40 mg daily.   Frequent headaches Assessment & Plan: Improved.  Continue propranolol ER 80 mg daily and sumatriptan 50 mg PRN.   Allergic rhinitis, unspecified seasonality, unspecified trigger Assessment & Plan: Uncontrolled.  Continue Singulair 10 mg HS. Add Zyrtec 10 mg.  Resume Flonase BID.         Pleas Koch, NP

## 2022-08-19 NOTE — Assessment & Plan Note (Signed)
Uncontrolled with A1C of 10.1.  Currently she is without insurance which makes treatment difficult. Will reach out to our pharmacist regarding patient assistance for Trulicity as she did well on this.  Continue Glipizide XL 10 mg daily. Will change to metformin XR 500 mg, take 1000 mg BID.  Discussed to start checking glucose readings. She will work on her diet.   Follow up in 3 months.

## 2022-08-19 NOTE — Telephone Encounter (Signed)
Yes to [place] please!

## 2022-08-19 NOTE — Assessment & Plan Note (Signed)
Controlled. ? ?Continue omeprazole 20 mg daily. ?

## 2022-08-20 ENCOUNTER — Other Ambulatory Visit: Payer: Self-pay | Admitting: Primary Care

## 2022-08-20 DIAGNOSIS — E1165 Type 2 diabetes mellitus with hyperglycemia: Secondary | ICD-10-CM

## 2022-08-20 MED ORDER — METFORMIN HCL ER 500 MG PO TB24: 1000.0000 mg | ORAL_TABLET | Freq: Two times a day (BID) | ORAL | 0 refills | Status: DC

## 2022-08-24 NOTE — Telephone Encounter (Signed)
Patient called in and was wanting to know if this paperwork could be mailed out to her. Please advise. Thank you!

## 2022-08-25 NOTE — Telephone Encounter (Signed)
Called and advised patient she will bring them back when she completes them.

## 2022-08-25 NOTE — Telephone Encounter (Signed)
Yes - I will put forms in the mail today. Alexandria Leblanc - can you let patient know forms will be mailed? She can bring the forms back to the office when she completes them.

## 2022-08-30 NOTE — Telephone Encounter (Signed)
Patient returned form.Left in [place] folder up front.

## 2022-08-31 ENCOUNTER — Other Ambulatory Visit: Payer: Self-pay | Admitting: Primary Care

## 2022-08-31 DIAGNOSIS — I10 Essential (primary) hypertension: Secondary | ICD-10-CM

## 2022-08-31 DIAGNOSIS — K219 Gastro-esophageal reflux disease without esophagitis: Secondary | ICD-10-CM

## 2022-08-31 DIAGNOSIS — E785 Hyperlipidemia, unspecified: Secondary | ICD-10-CM

## 2022-08-31 MED ORDER — FLUTICASONE FUROATE-VILANTEROL 100-25 MCG/ACT IN AEPB: 1.0000 | INHALATION_SPRAY | Freq: Every day | RESPIRATORY_TRACT | 11 refills | Status: DC

## 2022-08-31 NOTE — Telephone Encounter (Signed)
Faxed completed Breo application to [place].

## 2022-08-31 NOTE — Telephone Encounter (Signed)
Noted. Rx printed and attached to form. Placed in [place] desk.

## 2022-08-31 NOTE — Telephone Encounter (Signed)
Received completed forms from patient. Will need printed and signed Rx from PCP, then fax to [place]: [phone]. Left application with PCP.

## 2022-08-31 NOTE — Addendum Note (Signed)
Addended by: Pleas Koch on: 08/31/2022 01:47 PM   Modules accepted: Orders

## 2022-09-13 ENCOUNTER — Other Ambulatory Visit: Payer: Self-pay | Admitting: Primary Care

## 2022-09-13 DIAGNOSIS — J4540 Moderate persistent asthma, uncomplicated: Secondary | ICD-10-CM

## 2022-09-16 ENCOUNTER — Encounter: Payer: Self-pay | Admitting: Primary Care

## 2022-10-05 ENCOUNTER — Other Ambulatory Visit: Payer: Self-pay | Admitting: Primary Care

## 2022-10-05 DIAGNOSIS — J4540 Moderate persistent asthma, uncomplicated: Secondary | ICD-10-CM

## 2022-10-05 NOTE — Telephone Encounter (Signed)
Called and spoke with patient she states she does not need a refill of this right now, she recently picked it up.  Pharmacy is correct, [place].

## 2022-10-05 NOTE — Telephone Encounter (Signed)
Please call patient:  I just refilled her albuterol inhaler 2 weeks ago, did she pick this up yet? Received another refill request. Double check her pharmacy.

## 2022-10-13 ENCOUNTER — Telehealth: Payer: Self-pay | Admitting: Primary Care

## 2022-10-13 NOTE — Telephone Encounter (Addendum)
-----   Message from Doreene Nest, NP sent at 10/23/2021  3:18 PM EDT ----- Regarding: Due for CT chest    Please call patient: She is due for repeat non contrasted CT chest for pulmonary nodules that were seen from CT chest in May 2023.  Is she willing to have this done? I wasn't sure if she had insurance back yet.

## 2022-10-13 NOTE — Telephone Encounter (Signed)
Noted  

## 2022-10-13 NOTE — Telephone Encounter (Signed)
Called and spoke to patient she states she now has [employer] and she isnt sure if they will cover the scan. She stated she will reach out to them and find out if it would be covered .

## 2022-11-04 ENCOUNTER — Other Ambulatory Visit: Payer: Self-pay | Admitting: Primary Care

## 2022-11-04 DIAGNOSIS — E1165 Type 2 diabetes mellitus with hyperglycemia: Secondary | ICD-10-CM

## 2022-11-15 ENCOUNTER — Other Ambulatory Visit: Payer: Self-pay | Admitting: Primary Care

## 2022-11-15 DIAGNOSIS — A6000 Herpesviral infection of urogenital system, unspecified: Secondary | ICD-10-CM

## 2022-11-19 ENCOUNTER — Other Ambulatory Visit: Payer: Self-pay | Admitting: Primary Care

## 2022-11-19 ENCOUNTER — Ambulatory Visit: Payer: PRIVATE HEALTH INSURANCE | Admitting: Primary Care

## 2022-11-19 DIAGNOSIS — I10 Essential (primary) hypertension: Secondary | ICD-10-CM

## 2022-11-19 DIAGNOSIS — R519 Headache, unspecified: Secondary | ICD-10-CM

## 2022-11-19 DIAGNOSIS — J4540 Moderate persistent asthma, uncomplicated: Secondary | ICD-10-CM

## 2022-11-22 ENCOUNTER — Encounter: Payer: Self-pay | Admitting: Primary Care

## 2022-11-23 ENCOUNTER — Other Ambulatory Visit: Payer: Self-pay | Admitting: Primary Care

## 2022-11-23 DIAGNOSIS — E1165 Type 2 diabetes mellitus with hyperglycemia: Secondary | ICD-10-CM

## 2022-11-23 NOTE — Telephone Encounter (Signed)
Patient no showed her appointment last week.  Can we find out what's going on and get her rescheduled?

## 2022-11-24 NOTE — Telephone Encounter (Signed)
Called and rescheduled patient for f/u on 11/30/22.

## 2022-11-24 NOTE — Telephone Encounter (Signed)
Noted  

## 2022-11-26 ENCOUNTER — Other Ambulatory Visit: Payer: Self-pay | Admitting: Primary Care

## 2022-11-26 DIAGNOSIS — E785 Hyperlipidemia, unspecified: Secondary | ICD-10-CM

## 2022-11-30 ENCOUNTER — Ambulatory Visit: Payer: PRIVATE HEALTH INSURANCE | Admitting: Primary Care

## 2022-11-30 ENCOUNTER — Encounter: Payer: Self-pay | Admitting: Primary Care

## 2022-11-30 VITALS — BP 148/74 | HR 64 | Temp 97.5°F | Ht 64.0 in | Wt 193.0 lb

## 2022-11-30 DIAGNOSIS — I10 Essential (primary) hypertension: Secondary | ICD-10-CM

## 2022-11-30 DIAGNOSIS — Z7985 Long-term (current) use of injectable non-insulin antidiabetic drugs: Secondary | ICD-10-CM | POA: Diagnosis not present

## 2022-11-30 DIAGNOSIS — R918 Other nonspecific abnormal finding of lung field: Secondary | ICD-10-CM | POA: Insufficient documentation

## 2022-11-30 DIAGNOSIS — E785 Hyperlipidemia, unspecified: Secondary | ICD-10-CM

## 2022-11-30 DIAGNOSIS — E1165 Type 2 diabetes mellitus with hyperglycemia: Secondary | ICD-10-CM

## 2022-11-30 LAB — POCT GLYCOSYLATED HEMOGLOBIN (HGB A1C): Hemoglobin A1C: 9.9 % — AB (ref 4.0–5.6)

## 2022-11-30 NOTE — Patient Instructions (Addendum)
Stop by the lab prior to leaving today. I will notify you of your results once received.   You will either be contacted via phone regarding your CT scan, or you may receive a letter on your MyChart portal from our referral team with instructions for scheduling an appointment. Please let us know if you have not been contacted by anyone within two weeks.  Start Trulicity 0.75 mg weekly for diabetes.  Inject 0.75 mg weekly x 4 weeks, then we will increase to 1.5 mg weekly thereafter.  Start checking your blood sugars.  It is important that you improve your diet. Please limit carbohydrates in the form of white bread, rice, pasta, sweets, fast food, fried food, sugary drinks, etc. Increase your consumption of fresh fruits and vegetables, whole grains, lean protein.  Ensure you are consuming 64 ounces of water daily.  Monitor your blood pressure at home.  Notify me if you continue to run at or above 140 on top and/or 90 on bottom.  Please schedule a follow up visit for 3 months.  It was a pleasure to see you today!

## 2022-11-30 NOTE — Assessment & Plan Note (Signed)
Continue pravastatin 40 mg daily. Repeat lipid panel pending 

## 2022-11-30 NOTE — Assessment & Plan Note (Signed)
Uncontrolled with A1c of 9.9 today.  I have asked that she start Trulicity 0.75 mg weekly that was sent to her pharmacy in January. Start Trulicity 0.75 mg weekly x 4 weeks, then increase to 1.5 mg weekly thereafter.  She will notify when she uses her last 0.75 mg dose.  Continue metformin XR 1000 mg twice daily, glipizide XL 10 mg daily.  She will work on her diet and start walking.  Follow-up in 3 months.

## 2022-11-30 NOTE — Progress Notes (Signed)
Subjective:    Patient ID: Alexandria Leblanc, female    DOB: [birth year], 52 y.o.   MRN: [id number]  HPI  Alexandria Leblanc is a very pleasant 52 y.o. female with a history of hypertension asthma, alcoholic cirrhosis, type 2 diabetes, genital herpes, chronic back pain, splenomegaly who presents today for follow-up of diabetes and hyperlipidemia.  She is also due for CT chest to evaluate for pulmonary nodules.   1) Type 2 Diabetes:  Current medications include: Glipizide XL 10 mg daily, metformin XR 1000 mg twice daily, Trulicity 0.75 mg weekly.   She has not had her Trulicity in numerous months as she did not have insurance.   She is checking her blood glucose 0 times daily.  Last A1C: 10.1 in March 2025, 9.9 today Last Eye Exam: Up-to-date Last Foot Exam: Up-to-date Pneumonia Vaccination: 2017 Urine Microalbumin: Up-to-date Statin: Pravastatin  Dietary changes since last visit: She admits to eating poorly. She works at [employer] and often eats Paramedic with crackers, chips. She mostly drinks Diet [facility]. Little water.    Exercise: None  2) Hyperlipidemia: Currently managed on pravastatin 40 mg daily.  She is due for repeat lipid panel today.  3) Essential Hypertension: Currently managed on hydrochlorothiazide 25 mg daily, olmesartan 40 mg daily. She has checked her blood pressure a few times. Last night her BP was 132/76.  . BP Readings from Last 3 Encounters:  11/30/22 (!) 148/74  08/19/22 (!) 150/82  05/20/22 118/74      Review of Systems  Respiratory:  Negative for shortness of breath.   Cardiovascular:  Negative for chest pain.  Neurological:  Positive for numbness. Negative for dizziness.         Past Medical History:  Diagnosis Date   Alcohol abuse    Anxiety    Arthritis    Asthma    Carpal tunnel syndrome, bilateral    Depression    DJD (degenerative joint disease)    lower back   Gallstones    Genital warts    GERD  (gastroesophageal reflux disease)    Hepatic cirrhosis (HCC)    Hypertension    Migraines    Obesity    Portal hypertension with esophageal varices (HCC)    Splenomegaly    Thrombocytopenia (HCC)    Type 2 diabetes mellitus (HCC)     Social History   Socioeconomic History   Marital status: Single    Spouse name: Not on file   Number of children: 0   Years of education: Not on file   Highest education level: GED or equivalent  Occupational History   Occupation: Conservation officer, nature  Tobacco Use   Smoking status: Every Day    Packs/day: 1.00    Years: 35.00    Additional pack years: 0.00    Total pack years: 35.00    Types: Cigarettes   Smokeless tobacco: Never  Vaping Use   Vaping Use: Never used  Substance and Sexual Activity   Alcohol use: Yes    Alcohol/week: 35.0 standard drinks of alcohol    Types: 35 Cans of beer per week    Comment: 4 cans of beer 2x week   Drug use: No   Sexual activity: Not on file  Other Topics Concern   Not on file  Social History Narrative   Single; No children.; 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 1and half a day- smoking; 5 beers  a week; no hard liqor.           Social Determinants of Health   Financial Resource Strain: Low Risk  (11/15/2022)   Overall Financial Resource Strain (CARDIA)    Difficulty of Paying Living Expenses: Not hard at all  Food Insecurity: No Food Insecurity (11/15/2022)   Hunger Vital Sign    Worried About Running Out of Food in the Last Year: Never true    Ran Out of Food in the Last Year: Never true  Transportation Needs: No Transportation Needs (11/15/2022)   PRAPARE - Administrator, Civil Service (Medical): No    Lack of Transportation (Non-Medical): No  Physical Activity: Unknown (11/15/2022)   Exercise Vital Sign    Days of Exercise per Week: 0 days    Minutes of Exercise per Session: Not on file  Stress: Not on file  Social Connections: Socially  Isolated (11/15/2022)   Social Connection and Isolation Panel [NHANES]    Frequency of Communication with Friends and Family: Once a week    Frequency of Social Gatherings with Friends and Family: Never    Attends Religious Services: Never    Database administrator or Organizations: No    Attends Engineer, structural: Not on file    Marital Status: Never married  Intimate Partner Violence: Not on file    Past Surgical History:  Procedure Laterality Date   BIOPSY  10/22/2021   Procedure: BIOPSY;  Surgeon: Imogene Burn, MD;  Location: [facility];  Service: Gastroenterology;;  EGD and COLON   COLONOSCOPY WITH PROPOFOL N/A 10/22/2021   Procedure: COLONOSCOPY WITH PROPOFOL;  Surgeon: Imogene Burn, MD;  Location: [facility];  Service: Gastroenterology;  Laterality: N/A;   ESOPHAGOGASTRODUODENOSCOPY (EGD) WITH PROPOFOL N/A 10/22/2021   Procedure: ESOPHAGOGASTRODUODENOSCOPY (EGD) WITH PROPOFOL;  Surgeon: Imogene Burn, MD;  Location: [facility];  Service: Gastroenterology;  Laterality: N/A;   LESION REMOVAL N/A 02/04/2022   Procedure: EXCISION OF PERIANAL LESION x2;  Surgeon: Andria Meuse, MD;  Location: [facility];  Service: General;  Laterality: N/A;   POLYPECTOMY  10/22/2021   Procedure: POLYPECTOMY;  Surgeon: Imogene Burn, MD;  Location: [facility];  Service: Gastroenterology;;   RECTAL EXAM UNDER ANESTHESIA N/A 02/04/2022   Procedure: ANORECTAL EXAM UNDER ANESTHESIA;  Surgeon: Andria Meuse, MD;  Location: [facility];  Service: General;  Laterality: N/A;    Family History  Problem Relation Age of Onset   Heart disease Mother    Hypertension Mother    Dementia Mother    Diabetes Mother    Heart attack Mother    COPD Father    Hypertension Father    Alzheimer's disease Sister    Hyperlipidemia Brother    Diabetes Brother    Prostate cancer Paternal Uncle    Colon cancer Paternal Uncle     Allergies  Allergen  Reactions   Penicillins Hives and Itching   Sulfa Antibiotics Hives    Current Outpatient Medications on File Prior to Visit  Medication Sig Dispense Refill   acyclovir (ZOVIRAX) 400 MG tablet TAKE ONE TABLET BY MOUTH TWICE A DAY FOR HERPES PREVENTION. 180 tablet 2   albuterol (VENTOLIN HFA) 108 (90 Base) MCG/ACT inhaler INHALE TWO PUFFS INTO LUNGS EVERY SIX HOURS AS NEEDED FOR WHEEZING OR SHORTNESS OF BREATH 6.7 g 0   Blood Glucose Monitoring Suppl (FREESTYLE LITE) w/Device KIT 1 each by Does not apply route as directed. DX E11.9  1 kit 0   Dulaglutide (TRULICITY) 0.75 MG/0.5ML SOPN Inject 0.75 mg into the skin once a week. for diabetes. 6 mL 0   fluticasone (FLONASE) 50 MCG/ACT nasal spray PLACE 1 SPRAY INTO BOTH NOSTRILS 2 TIMESDAILY 16 g 3   fluticasone furoate-vilanterol (BREO ELLIPTA) 100-25 MCG/ACT AEPB Inhale 1 puff into the lungs daily. 1 each 11   glipiZIDE (GLUCOTROL XL) 10 MG 24 hr tablet TAKE ONE TABLET BY MOUTH ONCE DAILY WITH BREAKFAST FOR DIABETES 90 tablet 0   glucose blood test strip Use to test blood sugars up to three times a day Dx E11.9 200 each 3   hydrochlorothiazide (HYDRODIURIL) 25 MG tablet Take 1 tablet (25 mg total) by mouth daily. for blood pressure. 90 tablet 3   Lancets (FREESTYLE) lancets 1 each by Other route 3 (three) times daily. 100 each 2   metFORMIN (GLUCOPHAGE-XR) 500 MG 24 hr tablet TAKE TWO TABLETS (1,000 MG TOTAL) BY MOUTH TWO (TWO) TIMES DAILY WITH A MEAL. FOR DIABETES. 360 tablet 0   montelukast (SINGULAIR) 10 MG tablet TAKE ONE TABLET BY MOUTH EVERY NIGHT AT BEDTIME FOR ALLERGIES AND ASTHMA 90 tablet 0   olmesartan (BENICAR) 40 MG tablet TAKE ONE TABLET BY MOUTH ONCE A DAY FOR BLOOD PRESSURE 90 tablet 0   omeprazole (PRILOSEC) 20 MG capsule TAKE ONE CAPSULE BY MOUTH DAILY FOR HEARTBURN 90 capsule 3   ondansetron (ZOFRAN) 4 MG tablet Take 1 tablet (4 mg total) by mouth every 8 (eight) hours as needed for nausea or vomiting. 20 tablet 0   pravastatin  (PRAVACHOL) 40 MG tablet TAKE ONE TABLET (40 MG TOTAL) BY MOUTH DAILY. FOR CHOLESTEROL. 90 tablet 0   propranolol ER (INDERAL LA) 80 MG 24 hr capsule TAKE ONE CAPSULE BY MOUTH EVERY NIGHT AT BEDTIME FOR HEADACHE PREVENTION 90 capsule 0   SUMAtriptan (IMITREX) 50 MG tablet Take 1 tablet (50 mg total) by mouth every 2 (two) hours as needed for migraine. Take 1 tablet by mouth at migraine onset. May repeat in 2 hours if headache persists or recurs. (Patient not taking: Reported on 08/19/2022) 10 tablet 0   No current facility-administered medications on file prior to visit.    BP (!) 148/74   Pulse 64   Temp (!) 97.5 F (36.4 C) (Temporal)   Ht 5\' 4"  (1.626 m)   Wt 193 lb (87.5 kg)   LMP  (LMP Unknown)   SpO2 98%   BMI 33.13 kg/m  Objective:   Physical Exam Cardiovascular:     Rate and Rhythm: Normal rate and regular rhythm.  Pulmonary:     Effort: Pulmonary effort is normal.     Breath sounds: Normal breath sounds.  Musculoskeletal:     Cervical back: Neck supple.  Skin:    General: Skin is warm and dry.  Psychiatric:        Mood and Affect: Mood normal.           Assessment & Plan:  Type 2 diabetes mellitus with hyperglycemia, without long-term current use of insulin (HCC) Assessment & Plan: Uncontrolled with A1c of 9.9 today.  I have asked that she start Trulicity 0.75 mg weekly that was sent to her pharmacy in January. Start Trulicity 0.75 mg weekly x 4 weeks, then increase to 1.5 mg weekly thereafter.  She will notify when she uses her last 0.75 mg dose.  Continue metformin XR 1000 mg twice daily, glipizide XL 10 mg daily.  She will work on her diet and  start walking.  Follow-up in 3 months.  Orders: -     POCT glycosylated hemoglobin (Hb A1C)  Essential hypertension Assessment & Plan: Above goal today, also on recheck. Home readings are lower.  I have asked that she to continue to monitor blood pressure at home and report if readings are consistently at or  above 140/90.  Continue olmesartan 40 mg daily and hydrochlorothiazide 25 mg daily.   Hyperlipidemia, unspecified hyperlipidemia type Assessment & Plan: Continue pravastatin 40 mg daily. Repeat lipid panel pending.  Orders: -     Lipid panel -     Comprehensive metabolic panel  Pulmonary nodules Assessment & Plan: Reviewed CT chest from 2023.  CT chest without contrast ordered and pending.  Orders: -     CT CHEST WO CONTRAST; Future        Doreene Nest, NP

## 2022-11-30 NOTE — Assessment & Plan Note (Signed)
Above goal today, also on recheck. Home readings are lower.  I have asked that she to continue to monitor blood pressure at home and report if readings are consistently at or above 140/90.  Continue olmesartan 40 mg daily and hydrochlorothiazide 25 mg daily.

## 2022-11-30 NOTE — Assessment & Plan Note (Signed)
Reviewed CT chest from 2023.  CT chest without contrast ordered and pending.

## 2022-12-01 ENCOUNTER — Other Ambulatory Visit (HOSPITAL_COMMUNITY): Payer: Self-pay

## 2022-12-01 ENCOUNTER — Telehealth: Payer: Self-pay

## 2022-12-01 LAB — COMPREHENSIVE METABOLIC PANEL
ALT: 28 U/L (ref 0–35)
AST: 38 U/L — ABNORMAL HIGH (ref 0–37)
Albumin: 3.7 g/dL (ref 3.5–5.2)
Alkaline Phosphatase: 123 U/L — ABNORMAL HIGH (ref 39–117)
BUN: 5 mg/dL — ABNORMAL LOW (ref 6–23)
CO2: 26 mEq/L (ref 19–32)
Calcium: 9.1 mg/dL (ref 8.4–10.5)
Chloride: 99 mEq/L (ref 96–112)
Creatinine, Ser: 0.48 mg/dL (ref 0.40–1.20)
GFR: 108.95 mL/min (ref >60.00)
Glucose, Bld: 289 mg/dL — ABNORMAL HIGH (ref 70–99)
Potassium: 4.4 mEq/L (ref 3.5–5.1)
Sodium: 132 mEq/L — ABNORMAL LOW (ref 135–145)
Total Bilirubin: 0.4 mg/dL (ref 0.2–1.2)
Total Protein: 7.4 g/dL (ref 6.0–8.3)

## 2022-12-01 LAB — LIPID PANEL
Cholesterol: 173 mg/dL (ref 0–200)
HDL: 34.5 mg/dL — ABNORMAL LOW (ref >39.00)
LDL Cholesterol: 100 mg/dL — ABNORMAL HIGH (ref 0–99)
NonHDL: 138.89
Total CHOL/HDL Ratio: 5
Triglycerides: 194 mg/dL — ABNORMAL HIGH (ref 0.0–149.0)
VLDL: 38.8 mg/dL (ref 0.0–40.0)

## 2022-12-01 NOTE — Telephone Encounter (Signed)
Pharmacy Patient Advocate Encounter   Received notification from Pt Advice Request that prior authorization for Trulicity 0.75mg /0.47ml is required/requested.   Insurance verification completed.   The patient is insured through  [employer]  .    PA submitted to [employer] via CoverMyMeds Key/confirmation #/EOC [id number] Status is pending

## 2022-12-01 NOTE — Telephone Encounter (Signed)
PA has been submitted and documented in separate encounter, thank you.

## 2022-12-16 ENCOUNTER — Other Ambulatory Visit: Payer: Self-pay | Admitting: Primary Care

## 2022-12-16 DIAGNOSIS — J4540 Moderate persistent asthma, uncomplicated: Secondary | ICD-10-CM

## 2022-12-23 ENCOUNTER — Other Ambulatory Visit (HOSPITAL_COMMUNITY): Payer: Self-pay

## 2022-12-23 NOTE — Telephone Encounter (Signed)
Per test claim, PA has been approved

## 2023-01-06 ENCOUNTER — Other Ambulatory Visit: Payer: Self-pay | Admitting: Primary Care

## 2023-01-06 DIAGNOSIS — J4540 Moderate persistent asthma, uncomplicated: Secondary | ICD-10-CM

## 2023-01-24 ENCOUNTER — Other Ambulatory Visit: Payer: Self-pay | Admitting: Primary Care

## 2023-01-24 DIAGNOSIS — E1165 Type 2 diabetes mellitus with hyperglycemia: Secondary | ICD-10-CM

## 2023-02-04 ENCOUNTER — Other Ambulatory Visit: Payer: Self-pay | Admitting: Primary Care

## 2023-02-04 DIAGNOSIS — E785 Hyperlipidemia, unspecified: Secondary | ICD-10-CM

## 2023-02-04 DIAGNOSIS — I10 Essential (primary) hypertension: Secondary | ICD-10-CM

## 2023-02-04 DIAGNOSIS — J4540 Moderate persistent asthma, uncomplicated: Secondary | ICD-10-CM

## 2023-02-04 DIAGNOSIS — R519 Headache, unspecified: Secondary | ICD-10-CM

## 2023-02-14 ENCOUNTER — Other Ambulatory Visit: Payer: Self-pay | Admitting: Primary Care

## 2023-02-14 DIAGNOSIS — J302 Other seasonal allergic rhinitis: Secondary | ICD-10-CM

## 2023-02-14 DIAGNOSIS — E1165 Type 2 diabetes mellitus with hyperglycemia: Secondary | ICD-10-CM

## 2023-02-15 NOTE — Telephone Encounter (Signed)
Catie or Mardella Layman, can you guys look into what would be going on with her patient assistance for Breo?

## 2023-03-02 ENCOUNTER — Ambulatory Visit: Payer: PRIVATE HEALTH INSURANCE | Admitting: Primary Care

## 2023-03-02 ENCOUNTER — Encounter: Payer: Self-pay | Admitting: Primary Care

## 2023-03-02 VITALS — BP 156/78 | HR 67 | Temp 97.0°F | Ht 64.0 in | Wt 185.0 lb

## 2023-03-02 DIAGNOSIS — I10 Essential (primary) hypertension: Secondary | ICD-10-CM

## 2023-03-02 DIAGNOSIS — Z23 Encounter for immunization: Secondary | ICD-10-CM

## 2023-03-02 DIAGNOSIS — R519 Headache, unspecified: Secondary | ICD-10-CM

## 2023-03-02 DIAGNOSIS — Z7985 Long-term (current) use of injectable non-insulin antidiabetic drugs: Secondary | ICD-10-CM

## 2023-03-02 DIAGNOSIS — E1165 Type 2 diabetes mellitus with hyperglycemia: Secondary | ICD-10-CM | POA: Diagnosis not present

## 2023-03-02 DIAGNOSIS — M5441 Lumbago with sciatica, right side: Secondary | ICD-10-CM

## 2023-03-02 DIAGNOSIS — R918 Other nonspecific abnormal finding of lung field: Secondary | ICD-10-CM | POA: Diagnosis not present

## 2023-03-02 DIAGNOSIS — M5442 Lumbago with sciatica, left side: Secondary | ICD-10-CM

## 2023-03-02 DIAGNOSIS — J309 Allergic rhinitis, unspecified: Secondary | ICD-10-CM

## 2023-03-02 DIAGNOSIS — G8929 Other chronic pain: Secondary | ICD-10-CM

## 2023-03-02 LAB — POCT GLYCOSYLATED HEMOGLOBIN (HGB A1C): Hemoglobin A1C: 7.6 % — AB (ref 4.0–5.6)

## 2023-03-02 MED ORDER — CETIRIZINE HCL 10 MG PO TABS
10.0000 mg | ORAL_TABLET | Freq: Every day | ORAL | 3 refills | Status: DC
Start: 2023-03-02 — End: 2024-03-02

## 2023-03-02 MED ORDER — AMLODIPINE BESYLATE 5 MG PO TABS
5.0000 mg | ORAL_TABLET | Freq: Every day | ORAL | 0 refills | Status: DC
Start: 2023-03-02 — End: 2023-05-30

## 2023-03-02 MED ORDER — TRULICITY 1.5 MG/0.5ML ~~LOC~~ SOAJ
1.5000 mg | SUBCUTANEOUS | 1 refills | Status: DC
Start: 2023-03-02 — End: 2023-08-19

## 2023-03-02 MED ORDER — SUMATRIPTAN SUCCINATE 50 MG PO TABS
ORAL_TABLET | ORAL | 0 refills | Status: DC
Start: 2023-03-02 — End: 2023-03-22

## 2023-03-02 NOTE — Progress Notes (Signed)
Subjective:    Patient ID: Alexandria Leblanc, female    DOB: August 25, 1970, 52 y.o.   MRN: [id number]  HPI  Alexandria Leblanc is a very pleasant 52 y.o. female with a history of type 2 diabetes, hyperlipidemia, hypertension, alcoholic cirrhosis of the liver, tobacco dependence, asthma, migraines who presents today for follow-up of diabetes. She would also like to discuss chronic back pain.  1) Type 2 Diabetes:  Current medications include: Trulicity 0.75 mg weekly, glipizide XL 10 mg daily, metformin XR 1000 mg twice daily  She is checking her blood glucose 0 times daily.  Last A1C: 9.9 in July 2024, 7.6 today  Last Eye Exam: Due, she is aware Last Foot Exam: Up-to-date Pneumonia Vaccination: 2017 Urine Microalbumin: Up-to-date Statin: Pravastatin  Dietary changes since last visit: Continues to drink alcohol daily.    Exercise: None  BP Readings from Last 3 Encounters:  03/02/23 (!) 156/78  11/30/22 (!) 148/74  08/19/22 (!) 150/82   Wt Readings from Last 3 Encounters:  03/02/23 185 lb (83.9 kg)  11/30/22 193 lb (87.5 kg)  08/19/22 190 lb (86.2 kg)    2) Chronic Back Pain: Chronic for years and located to the bilateral lower back with radiation to the lower extremities.  Previously followed with neurosurgery, no recent office visit in greater than 1 year.   3) Frequent Headaches/Migraines: Chronic for years.  Currently prescribed propranolol ER 80 mg daily for preventative treatment and sumatriptan 50 mg as needed for abortive treatment. Overall her headaches have been controlled until allergy season began. Her headaches are located to the maxillary and frontal lobes. She's been taking "sinus headache pill" she uses Flonase on occasion.   She is compliant to her hydrochlorothiazide 25 mg daily, olmesartan 40 mg daily. She does not check her BP at home.     Review of Systems  Respiratory:  Negative for shortness of breath.   Cardiovascular:  Negative for chest pain.   Musculoskeletal:  Positive for back pain.  Allergic/Immunologic: Positive for environmental allergies.  Neurological:  Positive for numbness and headaches.         Past Medical History:  Diagnosis Date   Alcohol abuse    Anxiety    Arthritis    Asthma    Carpal tunnel syndrome, bilateral    Depression    DJD (degenerative joint disease)    lower back   Gallstones    Genital warts    GERD (gastroesophageal reflux disease)    Hepatic cirrhosis (HCC)    Hypertension    Migraines    Obesity    Portal hypertension with esophageal varices (HCC)    Splenomegaly    Thrombocytopenia (HCC)    Type 2 diabetes mellitus (HCC)     Social History   Socioeconomic History   Marital status: Single    Spouse name: Not on file   Number of children: 0   Years of education: Not on file   Highest education level: GED or equivalent  Occupational History   Occupation: Conservation officer, nature  Tobacco Use   Smoking status: Every Day    Current packs/day: 1.00    Average packs/day: 1 pack/day for 35.0 years (35.0 ttl pk-yrs)    Types: Cigarettes   Smokeless tobacco: Never  Vaping Use   Vaping status: Never Used  Substance and Sexual Activity   Alcohol use: Yes    Alcohol/week: 35.0 standard drinks of alcohol    Types: 35 Cans of beer per week  Comment: 4 cans of beer 2x week   Drug use: No   Sexual activity: Not on file  Other Topics Concern   Not on file  Social History Narrative   Single; No children.; 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 1and half a day- smoking; 5 beers a week; no hard liqor.           Social Determinants of Health   Financial Resource Strain: Low Risk  (11/15/2022)   Overall Financial Resource Strain (CARDIA)    Difficulty of Paying Living Expenses: Not hard at all  Food Insecurity: No Food Insecurity (11/15/2022)   Hunger Vital Sign    Worried About Running Out of Food in the Last Year: Never true    Ran Out  of Food in the Last Year: Never true  Transportation Needs: No Transportation Needs (11/15/2022)   PRAPARE - Administrator, Civil Service (Medical): No    Lack of Transportation (Non-Medical): No  Physical Activity: Unknown (11/15/2022)   Exercise Vital Sign    Days of Exercise per Week: 0 days    Minutes of Exercise per Session: Not on file  Stress: Not on file  Social Connections: Socially Isolated (11/15/2022)   Social Connection and Isolation Panel [NHANES]    Frequency of Communication with Friends and Family: Once a week    Frequency of Social Gatherings with Friends and Family: Never    Attends Religious Services: Never    Database administrator or Organizations: No    Attends Engineer, structural: Not on file    Marital Status: Never married  Intimate Partner Violence: Not on file    Past Surgical History:  Procedure Laterality Date   BIOPSY  10/22/2021   Procedure: BIOPSY;  Surgeon: Imogene Burn, MD;  Location: [facility];  Service: Gastroenterology;;  EGD and COLON   COLONOSCOPY WITH PROPOFOL N/A 10/22/2021   Procedure: COLONOSCOPY WITH PROPOFOL;  Surgeon: Imogene Burn, MD;  Location: [facility];  Service: Gastroenterology;  Laterality: N/A;   ESOPHAGOGASTRODUODENOSCOPY (EGD) WITH PROPOFOL N/A 10/22/2021   Procedure: ESOPHAGOGASTRODUODENOSCOPY (EGD) WITH PROPOFOL;  Surgeon: Imogene Burn, MD;  Location: [facility];  Service: Gastroenterology;  Laterality: N/A;   LESION REMOVAL N/A 02/04/2022   Procedure: EXCISION OF PERIANAL LESION x2;  Surgeon: Andria Meuse, MD;  Location: [facility];  Service: General;  Laterality: N/A;   POLYPECTOMY  10/22/2021   Procedure: POLYPECTOMY;  Surgeon: Imogene Burn, MD;  Location: [facility];  Service: Gastroenterology;;   RECTAL EXAM UNDER ANESTHESIA N/A 02/04/2022   Procedure: ANORECTAL EXAM UNDER ANESTHESIA;  Surgeon: Andria Meuse, MD;  Location: [facility];  Service:  General;  Laterality: N/A;    Family History  Problem Relation Age of Onset   Heart disease Mother    Hypertension Mother    Dementia Mother    Diabetes Mother    Heart attack Mother    COPD Father    Hypertension Father    Alzheimer's disease Sister    Hyperlipidemia Brother    Diabetes Brother    Prostate cancer Paternal Uncle    Colon cancer Paternal Uncle     Allergies  Allergen Reactions   Penicillins Hives and Itching   Sulfa Antibiotics Hives    Current Outpatient Medications on File Prior to Visit  Medication Sig Dispense Refill   acyclovir (ZOVIRAX) 400 MG tablet TAKE ONE TABLET BY  MOUTH TWICE A DAY FOR HERPES PREVENTION. 180 tablet 2   albuterol (VENTOLIN HFA) 108 (90 Base) MCG/ACT inhaler INHALE TWO PUFFS INTO LUNGS EVERY SIX HOURS AS NEEDED FOR WHEEZING OR SHORTNESS OF BREATH 8.5 g 0   Blood Glucose Monitoring Suppl (FREESTYLE LITE) w/Device KIT 1 each by Does not apply route as directed. DX E11.9 1 kit 0   fluticasone (FLONASE) 50 MCG/ACT nasal spray PLACE ONE SPRAY INTO BOTH NOSTRILS TWO TIMES DAILY 16 mL 3   fluticasone furoate-vilanterol (BREO ELLIPTA) 100-25 MCG/ACT AEPB Inhale 1 puff into the lungs daily. 1 each 11   glipiZIDE (GLUCOTROL XL) 10 MG 24 hr tablet TAKE ONE TABLET BY MOUTH ONCE DAILY WITH BREAKFAST FOR DIABETES 90 tablet 0   glucose blood test strip Use to test blood sugars up to three times a day Dx E11.9 200 each 3   hydrochlorothiazide (HYDRODIURIL) 25 MG tablet Take 1 tablet (25 mg total) by mouth daily. for blood pressure. 90 tablet 3   Lancets (FREESTYLE) lancets 1 each by Other route 3 (three) times daily. 100 each 2   metFORMIN (GLUCOPHAGE-XR) 500 MG 24 hr tablet TAKE TWO TABLETS (1,000 MG TOTAL) BY MOUTH TWO TIMES DAILY WITH A MEAL FOR DIABETES. 360 tablet 0   montelukast (SINGULAIR) 10 MG tablet TAKE ONE TABLET BY MOUTH EVERY NIGHT AT BEDTIME FOR ALLERGIES AND ASTHMA 90 tablet 0   olmesartan (BENICAR) 40 MG tablet TAKE ONE TABLET BY MOUTH  ONCE A DAY FOR BLOOD PRESSURE 90 tablet 0   omeprazole (PRILOSEC) 20 MG capsule TAKE ONE CAPSULE BY MOUTH DAILY FOR HEARTBURN 90 capsule 3   ondansetron (ZOFRAN) 4 MG tablet Take 1 tablet (4 mg total) by mouth every 8 (eight) hours as needed for nausea or vomiting. 20 tablet 0   pravastatin (PRAVACHOL) 40 MG tablet TAKE 1 TABLET BY MOUTH ONCE A DAY FOR CHOLESTEROL 90 tablet 0   propranolol ER (INDERAL LA) 80 MG 24 hr capsule TAKE ONE CAPSULE BY MOUTH EVERY NIGHT AT BEDTIME FOR HEADACHE PREVENTION 90 capsule 0   No current facility-administered medications on file prior to visit.    BP (!) 156/78   Pulse 67   Temp (!) 97 F (36.1 C) (Temporal)   Ht 5\' 4"  (1.626 m)   Wt 185 lb (83.9 kg)   LMP  (LMP Unknown)   SpO2 96%   BMI 31.76 kg/m  Objective:   Physical Exam Cardiovascular:     Rate and Rhythm: Normal rate and regular rhythm.  Pulmonary:     Effort: Pulmonary effort is normal.     Breath sounds: Normal breath sounds.  Musculoskeletal:     Cervical back: Neck supple.  Skin:    General: Skin is warm and dry.  Neurological:     Mental Status: She is alert and oriented to person, place, and time.  Psychiatric:        Mood and Affect: Mood normal.           Assessment & Plan:  Type 2 diabetes mellitus with hyperglycemia, without long-term current use of insulin (HCC) Assessment & Plan: Improved with A1c 7.6 today, but remains above goal.  Commended patient on improved A1c and 8 lb weight loss.   Increase Trulicity to 1.5 mg [place] weekly for better diabetes control.  Follow up in 6 months.   I evaluated patient, was consulted regarding treatment, and agree with assessment and plan per Tenna Delaine, RN, DNP student.   Mayra Reel, NP-C  Orders: -     POCT glycosylated hemoglobin (Hb A1C) -     Trulicity; Inject 1.5 mg into the skin once a week. for diabetes.  Dispense: 6 mL; Refill: 1  Frequent headaches Assessment & Plan: Overall improved, but still  experiencing with season changes.   Continue Propranolol ER 80 mg daily.   Resume Sumatriptan 50 mg PRN. Re-ordered and sent to pharmacy.   Add Zyrtec 10 mg PRN.   I evaluated patient, was consulted regarding treatment, and agree with assessment and plan per Tenna Delaine, RN, DNP student.   Mayra Reel, NP-C   Orders: -     SUMAtriptan Succinate; Take 1 tablet by mouth at migraine onset. May repeat in 2 hours if headache persists or recurs.  Dispense: 10 tablet; Refill: 0  Pulmonary nodules Assessment & Plan: Reviewed CT chest from 2023. Due for repeat imaging.  CT chest re-ordered and pending. Patient agrees.   I evaluated patient, was consulted regarding treatment, and agree with assessment and plan per Tenna Delaine, RN, DNP student.   Mayra Reel, NP-C   Orders: -     CT CHEST WO CONTRAST; Future  Essential hypertension Assessment & Plan: Remains above goal, also on recheck.  Continue hydrochlorothiazide 25 mg daily and olmesartan 40 mg daily.   Start amlodipine 5 mg daily.   Follow up in 2-3 weeks for BP recheck.   I evaluated patient, was consulted regarding treatment, and agree with assessment and plan per Tenna Delaine, RN, DNP student.   Mayra Reel, NP-C   Orders: -     amLODIPine Besylate; Take 1 tablet (5 mg total) by mouth daily. for blood pressure.  Dispense: 90 tablet; Refill: 0  Chronic bilateral low back pain with bilateral sciatica Assessment & Plan: Uncontrolled and ongoing.   Referral placed to Neurosurgery for further evaluation as injections were helpful previously.  I evaluated patient, was consulted regarding treatment, and agree with assessment and plan per Tenna Delaine, RN, DNP student.   Mayra Reel, NP-C   Orders: -     Ambulatory referral to Physical Medicine Rehab  Allergic rhinitis, unspecified seasonality, unspecified trigger Assessment & Plan: Ongoing with season changes.  Continue Singulair 10 mg HS.  Resume  Zyrtec 10 mg PRN.   I evaluated patient, was consulted regarding treatment, and agree with assessment and plan per Tenna Delaine, RN, DNP student.   Mayra Reel, NP-C   Orders: -     Cetirizine HCl; Take 1 tablet (10 mg total) by mouth daily. For allergies  Dispense: 90 tablet; Refill: 3  Encounter for immunization -     Flu vaccine trivalent PF, 6mos and older(Flulaval,Afluria,Fluarix,Fluzone)        Doreene Nest, NP

## 2023-03-02 NOTE — Patient Instructions (Signed)
Increase Trulicity to 1.5 mg weekly when you get the new box.  Start Amlodipine 5 mg once per day for your blood pressure.   Start Zyrtec daily as needed for allergies.   We re-ordered Imitrex for your migraines. Take 1 tablet at migraine onset. May repeat with second tablet 2 hours later if migraine persists.   You will get a phone call from the Imaging Center to get your follow up CT scan scheduled.  You will either be contacted via phone regarding your referral to Neurosurgery or you may receive a letter on your MyChart portal from our referral team with instructions for scheduling an appointment. Please let us know if you have not been contacted by anyone within two weeks.  Schedule a blood pressure follow up for 2-3 weeks.

## 2023-03-02 NOTE — Assessment & Plan Note (Addendum)
Reviewed CT chest from 2023. Due for repeat imaging.  CT chest re-ordered and pending. Patient agrees.   I evaluated patient, was consulted regarding treatment, and agree with assessment and plan per Tenna Delaine, RN, DNP student.   Mayra Reel, NP-C

## 2023-03-02 NOTE — Assessment & Plan Note (Addendum)
Ongoing with season changes.  Continue Singulair 10 mg HS.  Resume Zyrtec 10 mg PRN.   I evaluated patient, was consulted regarding treatment, and agree with assessment and plan per Tenna Delaine, RN, DNP student.   Mayra Reel, NP-C

## 2023-03-02 NOTE — Assessment & Plan Note (Addendum)
Uncontrolled and ongoing.   Referral placed to Neurosurgery for further evaluation as injections were helpful previously.  I evaluated patient, was consulted regarding treatment, and agree with assessment and plan per Tenna Delaine, RN, DNP student.   Mayra Reel, NP-C

## 2023-03-02 NOTE — Assessment & Plan Note (Addendum)
Overall improved, but still experiencing with season changes.   Continue Propranolol ER 80 mg daily.   Resume Sumatriptan 50 mg PRN. Re-ordered and sent to pharmacy.   Add Zyrtec 10 mg PRN.   I evaluated patient, was consulted regarding treatment, and agree with assessment and plan per Tenna Delaine, RN, DNP student.   Mayra Reel, NP-C

## 2023-03-02 NOTE — Assessment & Plan Note (Addendum)
Remains above goal, also on recheck.  Continue hydrochlorothiazide 25 mg daily and olmesartan 40 mg daily.   Start amlodipine 5 mg daily.   Follow up in 2-3 weeks for BP recheck.   I evaluated patient, was consulted regarding treatment, and agree with assessment and plan per Tenna Delaine, RN, DNP student.   Mayra Reel, NP-C

## 2023-03-02 NOTE — Assessment & Plan Note (Addendum)
Improved with A1c 7.6 today, but remains above goal.  Commended patient on improved A1c and 8 lb weight loss.   Increase Trulicity to 1.5 mg [place] weekly for better diabetes control.  Follow up in 6 months.   I evaluated patient, was consulted regarding treatment, and agree with assessment and plan per Tenna Delaine, RN, DNP student.   Mayra Reel, NP-C

## 2023-03-02 NOTE — Progress Notes (Signed)
Established Patient Office Visit  Subjective   Patient ID: Alexandria Leblanc, female    DOB: [birth year]  Age: 52 y.o. MRN: [id number]  Chief Complaint  Patient presents with   Medical Management of Chronic Issues    HPI  Alexandria Leblanc is a 52 y.o. female with a history of hypertension, asthma, alcoholic cirrhosis, uncontrolled type 2, chronic back pain, tobacco abuse who presents today for diabetes follow up.   Current medications include: Trulicity 0.75 mg [place] weekly, Glipizide XL 10 mg daily, Metformin 500 mg BID  She is compliant to her medications. She is not checking her blood glucose at home.    Last A1C: 7.6 today, previous 9.9 on 11/30/22 Last Eye Exam: Overdue, she will schedule after she gets eye insurance during next open enrollment period with new job Last Foot Exam: UTD   Urine Microalbumin: UTD Statin: Pravastatin 40 mg daily  Flu shot: Completed today  She smokes 1 PPD and drinks 3 24-oz cans of beer per day.   Dietary changes since last visit: She has not made major changes to her diet, but does have less cravings since taking Trulicity.   Exercise: No regular exercise. She has lost 8 lb since last visit.  Additionally, she has been having daily headaches with season changes/allergies. Her sciatica is also causing back and leg pain. Her blood pressure remains above goal. She is not checking her blood pressure at home.      Review of Systems  Respiratory:  Negative for shortness of breath.   Cardiovascular:  Negative for chest pain.  Gastrointestinal:  Negative for constipation, diarrhea, nausea and vomiting.  Musculoskeletal:  Positive for back pain.  Neurological:  Positive for headaches. Negative for dizziness and focal weakness.      Objective:     BP (!) 156/78   Pulse 67   Temp (!) 97 F (36.1 C) (Temporal)   Ht 5\' 4"  (1.626 m)   Wt 185 lb (83.9 kg)   LMP  (LMP Unknown)   SpO2 96%   BMI 31.76 kg/m   BP Readings from Last 3 Encounters:   03/02/23 (!) 156/78  11/30/22 (!) 148/74  08/19/22 (!) 150/82   Wt Readings from Last 3 Encounters:  03/02/23 185 lb (83.9 kg)  11/30/22 193 lb (87.5 kg)  08/19/22 190 lb (86.2 kg)    Physical Exam Cardiovascular:     Rate and Rhythm: Normal rate and regular rhythm.     Pulses: Normal pulses.  Pulmonary:     Effort: Pulmonary effort is normal.  Skin:    General: Skin is warm and dry.  Neurological:     General: No focal deficit present.     Mental Status: She is alert.     Results for orders placed or performed in visit on 03/02/23  POCT glycosylated hemoglobin (Hb A1C)  Result Value Ref Range   Hemoglobin A1C 7.6 (A) 4.0 - 5.6 %   HbA1c POC (<> result, manual entry)     HbA1c, POC (prediabetic range)     HbA1c, POC (controlled diabetic range)        The 10-year ASCVD risk score (Arnett DK, et al., 2019) is: 20.1%    Assessment & Plan:   Problem List Items Addressed This Visit       Cardiovascular and Mediastinum   Essential hypertension    Remains above goal, also on recheck.  Continue hydrochlorothiazide 25 mg daily and olmesartan 40 mg daily.   Start amlodipine 5  mg daily.   Follow up in 2-3 weeks for BP recheck.       Relevant Medications   amLODipine (NORVASC) 5 MG tablet     Respiratory   Allergic rhinitis    Ongoing with season changes.  Continue Singulair 10 mg HS.  Resume Zyrtec 10 mg PRN.       Relevant Medications   cetirizine (ZYRTEC) 10 MG tablet   Pulmonary nodules    Reviewed CT chest from 2023.  CT chest re-ordered and pending. Patient agrees.       Relevant Orders   CT Chest Wo Contrast     Endocrine   Type 2 diabetes mellitus with hyperglycemia (HCC) - Primary    Improved with A1c 7.6 today, but remains above goal.  Commended patient on improved A1c and 8 lb weight loss.   Increase Trulicity to 1.5 mg [place] weekly for better diabetes control.  Follow up in 6 months.       Relevant Medications   Dulaglutide  (TRULICITY) 1.5 MG/0.5ML SOPN   Other Relevant Orders   POCT glycosylated hemoglobin (Hb A1C) (Completed)     Other   Chronic low back pain    Uncontrolled and ongoing.   Referral placed to Neurosurgery for further evaluation.      Relevant Orders   Ambulatory referral to Physical Medicine Rehab   Frequent headaches    Overall improved, but still experiencing with season changes.   Continue Propranolol ER 80 mg daily.   Resume Sumatriptan 50 mg PRN. Re-ordered and sent to pharmacy.   Add Zyrtec 10 mg PRN.        Relevant Medications   SUMAtriptan (IMITREX) 50 MG tablet   amLODipine (NORVASC) 5 MG tablet   Other Visit Diagnoses     Encounter for immunization       Relevant Orders   Flu vaccine trivalent PF, 6mos and older(Flulaval,Afluria,Fluarix,Fluzone) (Completed)       No follow-ups on file.    Benito Mccreedy, RN

## 2023-03-04 ENCOUNTER — Other Ambulatory Visit: Payer: Self-pay | Admitting: Primary Care

## 2023-03-04 DIAGNOSIS — R519 Headache, unspecified: Secondary | ICD-10-CM

## 2023-03-09 ENCOUNTER — Ambulatory Visit: Payer: PRIVATE HEALTH INSURANCE

## 2023-03-11 NOTE — Telephone Encounter (Signed)
Patient needs repeat CT chest for various findings on CT from last year. This is cost prohibitive for her as mentioned below.   Are there any patient assistance programs or is there any discount we can provide her? She has low income.

## 2023-03-14 ENCOUNTER — Ambulatory Visit: Payer: PRIVATE HEALTH INSURANCE

## 2023-03-15 NOTE — Telephone Encounter (Signed)
I am not aware of any type of Patient Assistance Programs for assistance with paying for scans. If insurance is on file then the insurance must be filed for the appt.  She can talk to the billing dept with the imaging center on options.

## 2023-03-15 NOTE — Telephone Encounter (Signed)
Spoke with Alexandria Leblanc, she advised there are patient assistance programs avaliable. Sending this message to her so she can get it to the appropriate people.

## 2023-03-16 NOTE — Telephone Encounter (Signed)
Thank you all!  Alexandria Leblanc, let me know what you find. Can you also contact the patient regarding options?

## 2023-03-16 NOTE — Telephone Encounter (Signed)
Other than talking to someone in patient assistance I'm not sure of any assistance.

## 2023-03-22 ENCOUNTER — Other Ambulatory Visit: Payer: Self-pay | Admitting: Primary Care

## 2023-03-22 ENCOUNTER — Encounter: Payer: Self-pay | Admitting: Primary Care

## 2023-03-22 ENCOUNTER — Ambulatory Visit: Payer: PRIVATE HEALTH INSURANCE | Admitting: Primary Care

## 2023-03-22 VITALS — BP 136/64 | HR 70 | Temp 97.0°F | Ht 64.0 in | Wt 185.0 lb

## 2023-03-22 DIAGNOSIS — R519 Headache, unspecified: Secondary | ICD-10-CM

## 2023-03-22 DIAGNOSIS — I10 Essential (primary) hypertension: Secondary | ICD-10-CM

## 2023-03-22 DIAGNOSIS — E1165 Type 2 diabetes mellitus with hyperglycemia: Secondary | ICD-10-CM

## 2023-03-22 MED ORDER — RIZATRIPTAN BENZOATE 5 MG PO TABS
ORAL_TABLET | ORAL | 0 refills | Status: DC
Start: 2023-03-22 — End: 2023-08-09

## 2023-03-22 MED ORDER — PROPRANOLOL HCL ER 120 MG PO CP24
120.0000 mg | ORAL_CAPSULE | Freq: Every day | ORAL | 0 refills | Status: DC
Start: 2023-03-22 — End: 2023-06-08

## 2023-03-22 NOTE — Assessment & Plan Note (Signed)
Improved.  Continue olmesartan 40 mg daily, hydrochlorothiazide 25 mg daily, amlodipine 5 mg daily.

## 2023-03-22 NOTE — Assessment & Plan Note (Signed)
Deteriorated.  Increase propranolol ER to 120 mg daily. Discontinue sumatriptan given side effects.  Start rizatriptan 5 mg as needed.  Instructions provided.

## 2023-03-22 NOTE — Progress Notes (Signed)
Subjective:    Patient ID: Alexandria Leblanc, female    DOB: 08-Sep-1970, 52 y.o.   MRN: [id number]  HPI  Alexandria Leblanc is a very pleasant 52 y.o. female with a history of hypertension, asthma, alcoholic cirrhosis, type 2 diabetes, genital herpes, chronic back pain, thrombocytopenia who presents today for follow up of hypertension and headaches.  She was last evaluated on 03/02/23 for follow up of diabetes. During this visit her BP was noted to be above goal again, also during prior visits, also with increased headaches.  She was compliant to her hydrochlorothiazide 25 mg daily and olmesartan 40 mg daily.  Given her above goal readings we initiated amlodipine 5 mg daily.  She is here for follow-up today.  Since her last visit she is compliant to hydrochlorothiazide 25 mg daily, olmesartan 40 mg daily, and amlodipine 5 mg daily. She denies increased dizziness. She continues to experience daily headaches which are located to the bilateral frontal lobes. She denies photophobia and nausea.   She stopped taking sumatriptan 1 week ago as it caused chest burning and shoulder pain. She's been taking Flonase and Zyrtec daily. She's compliant to propranolol ER 80 mg which has helped historically, but over the last month has not helped.     BP Readings from Last 3 Encounters:  03/22/23 136/64  03/02/23 (!) 156/78  11/30/22 (!) 148/74      Review of Systems  Respiratory:  Negative for shortness of breath.   Cardiovascular:  Negative for chest pain.  Allergic/Immunologic: Positive for environmental allergies.  Neurological:  Positive for headaches. Negative for dizziness.         Past Medical History:  Diagnosis Date   Alcohol abuse    Anxiety    Arthritis    Asthma    Carpal tunnel syndrome, bilateral    Depression    DJD (degenerative joint disease)    lower back   Gallstones    Genital warts    GERD (gastroesophageal reflux disease)    Hepatic cirrhosis (HCC)    Hypertension     Migraines    Obesity    Portal hypertension with esophageal varices (HCC)    Splenomegaly    Thrombocytopenia (HCC)    Type 2 diabetes mellitus (HCC)     Social History   Socioeconomic History   Marital status: Single    Spouse name: Not on file   Number of children: 0   Years of education: Not on file   Highest education level: GED or equivalent  Occupational History   Occupation: Conservation officer, nature  Tobacco Use   Smoking status: Every Day    Current packs/day: 1.00    Average packs/day: 1 pack/day for 35.0 years (35.0 ttl pk-yrs)    Types: Cigarettes   Smokeless tobacco: Never  Vaping Use   Vaping status: Never Used  Substance and Sexual Activity   Alcohol use: Yes    Alcohol/week: 35.0 standard drinks of alcohol    Types: 35 Cans of beer per week    Comment: 4 cans of beer 2x week   Drug use: No   Sexual activity: Not on file  Other Topics Concern   Not on file  Social History Narrative   Single; No children.; 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 1and half a day- smoking; 5 beers a week; no hard liqor.           Social Determinants of Health  Financial Resource Strain: Low Risk  (03/18/2023)   Overall Financial Resource Strain (CARDIA)    Difficulty of Paying Living Expenses: Not hard at all  Food Insecurity: No Food Insecurity (03/18/2023)   Hunger Vital Sign    Worried About Running Out of Food in the Last Year: Never true    Ran Out of Food in the Last Year: Never true  Transportation Needs: No Transportation Needs (03/18/2023)   PRAPARE - Administrator, Civil Service (Medical): No    Lack of Transportation (Non-Medical): No  Physical Activity: Insufficiently Active (03/18/2023)   Exercise Vital Sign    Days of Exercise per Week: 2 days    Minutes of Exercise per Session: 10 min  Stress: Stress Concern Present (03/18/2023)   [employer] of Occupational Health - Occupational Stress  Questionnaire    Feeling of Stress : To some extent  Social Connections: Socially Isolated (03/18/2023)   Social Connection and Isolation Panel [NHANES]    Frequency of Communication with Friends and Family: Never    Frequency of Social Gatherings with Friends and Family: Never    Attends Religious Services: Never    Database administrator or Organizations: No    Attends Engineer, structural: Not on file    Marital Status: Never married  Intimate Partner Violence: Not on file    Past Surgical History:  Procedure Laterality Date   BIOPSY  10/22/2021   Procedure: BIOPSY;  Surgeon: Imogene Burn, MD;  Location: [facility];  Service: Gastroenterology;;  EGD and COLON   COLONOSCOPY WITH PROPOFOL N/A 10/22/2021   Procedure: COLONOSCOPY WITH PROPOFOL;  Surgeon: Imogene Burn, MD;  Location: [facility];  Service: Gastroenterology;  Laterality: N/A;   ESOPHAGOGASTRODUODENOSCOPY (EGD) WITH PROPOFOL N/A 10/22/2021   Procedure: ESOPHAGOGASTRODUODENOSCOPY (EGD) WITH PROPOFOL;  Surgeon: Imogene Burn, MD;  Location: [facility];  Service: Gastroenterology;  Laterality: N/A;   LESION REMOVAL N/A 02/04/2022   Procedure: EXCISION OF PERIANAL LESION x2;  Surgeon: Andria Meuse, MD;  Location: [facility];  Service: General;  Laterality: N/A;   POLYPECTOMY  10/22/2021   Procedure: POLYPECTOMY;  Surgeon: Imogene Burn, MD;  Location: [facility];  Service: Gastroenterology;;   RECTAL EXAM UNDER ANESTHESIA N/A 02/04/2022   Procedure: ANORECTAL EXAM UNDER ANESTHESIA;  Surgeon: Andria Meuse, MD;  Location: [facility];  Service: General;  Laterality: N/A;    Family History  Problem Relation Age of Onset   Heart disease Mother    Hypertension Mother    Dementia Mother    Diabetes Mother    Heart attack Mother    COPD Father    Hypertension Father    Alzheimer's disease Sister    Hyperlipidemia Brother    Diabetes Brother    Prostate cancer  Paternal Uncle    Colon cancer Paternal Uncle     Allergies  Allergen Reactions   Penicillins Hives and Itching   Sulfa Antibiotics Hives    Current Outpatient Medications on File Prior to Visit  Medication Sig Dispense Refill   acyclovir (ZOVIRAX) 400 MG tablet TAKE ONE TABLET BY MOUTH TWICE A DAY FOR HERPES PREVENTION. 180 tablet 2   albuterol (VENTOLIN HFA) 108 (90 Base) MCG/ACT inhaler INHALE TWO PUFFS INTO LUNGS EVERY SIX HOURS AS NEEDED FOR WHEEZING OR SHORTNESS OF BREATH 8.5 g 0   amLODipine (NORVASC) 5 MG tablet Take 1 tablet (5 mg total) by mouth daily. for blood pressure. 90 tablet 0  Blood Glucose Monitoring Suppl (FREESTYLE LITE) w/Device KIT 1 each by Does not apply route as directed. DX E11.9 1 kit 0   cetirizine (ZYRTEC) 10 MG tablet Take 1 tablet (10 mg total) by mouth daily. For allergies 90 tablet 3   Dulaglutide (TRULICITY) 1.5 MG/0.5ML SOPN Inject 1.5 mg into the skin once a week. for diabetes. 6 mL 1   fluticasone (FLONASE) 50 MCG/ACT nasal spray PLACE ONE SPRAY INTO BOTH NOSTRILS TWO TIMES DAILY 16 mL 3   fluticasone furoate-vilanterol (BREO ELLIPTA) 100-25 MCG/ACT AEPB Inhale 1 puff into the lungs daily. 1 each 11   glipiZIDE (GLUCOTROL XL) 10 MG 24 hr tablet TAKE ONE TABLET BY MOUTH ONCE DAILY WITH BREAKFAST FOR DIABETES 90 tablet 0   glucose blood test strip Use to test blood sugars up to three times a day Dx E11.9 200 each 3   hydrochlorothiazide (HYDRODIURIL) 25 MG tablet Take 1 tablet (25 mg total) by mouth daily. for blood pressure. 90 tablet 3   Lancets (FREESTYLE) lancets 1 each by Other route 3 (three) times daily. 100 each 2   metFORMIN (GLUCOPHAGE-XR) 500 MG 24 hr tablet TAKE TWO TABLETS (1,000 MG TOTAL) BY MOUTH TWO TIMES DAILY WITH A MEAL FOR DIABETES. 360 tablet 0   montelukast (SINGULAIR) 10 MG tablet TAKE ONE TABLET BY MOUTH EVERY NIGHT AT BEDTIME FOR ALLERGIES AND ASTHMA 90 tablet 0   olmesartan (BENICAR) 40 MG tablet TAKE ONE TABLET BY MOUTH ONCE A  DAY FOR BLOOD PRESSURE 90 tablet 0   omeprazole (PRILOSEC) 20 MG capsule TAKE ONE CAPSULE BY MOUTH DAILY FOR HEARTBURN 90 capsule 3   ondansetron (ZOFRAN) 4 MG tablet Take 1 tablet (4 mg total) by mouth every 8 (eight) hours as needed for nausea or vomiting. 20 tablet 0   pravastatin (PRAVACHOL) 40 MG tablet TAKE 1 TABLET BY MOUTH ONCE A DAY FOR CHOLESTEROL 90 tablet 0   No current facility-administered medications on file prior to visit.    BP 136/64   Pulse 70   Temp (!) 97 F (36.1 C) (Temporal)   Ht 5\' 4"  (1.626 m)   Wt 185 lb (83.9 kg)   LMP  (LMP Unknown)   SpO2 96%   BMI 31.76 kg/m  Objective:   Physical Exam Cardiovascular:     Rate and Rhythm: Normal rate and regular rhythm.  Pulmonary:     Effort: Pulmonary effort is normal.     Breath sounds: Normal breath sounds.  Musculoskeletal:     Cervical back: Neck supple.  Skin:    General: Skin is warm and dry.  Neurological:     Mental Status: She is alert and oriented to person, place, and time.  Psychiatric:        Mood and Affect: Mood normal.           Assessment & Plan:  Frequent headaches Assessment & Plan: Deteriorated.  Increase propranolol ER to 120 mg daily. Discontinue sumatriptan given side effects.  Start rizatriptan 5 mg as needed.  Instructions provided.  Orders: -     Propranolol HCl ER; Take 1 capsule (120 mg total) by mouth at bedtime. For headache prevention  Dispense: 90 capsule; Refill: 0 -     Rizatriptan Benzoate; Take 1 tablet by mouth at migraine onset. May repeat in 2 hours if needed  Dispense: 10 tablet; Refill: 0  Essential hypertension Assessment & Plan: Improved.  Continue olmesartan 40 mg daily, hydrochlorothiazide 25 mg daily, amlodipine 5 mg daily.  Doreene Nest, NP

## 2023-03-22 NOTE — Patient Instructions (Addendum)
[facility] - Physiatry Open [address] [place], [place] [address] Office: [phone]  Fax: [phone]  We increased your dose of propranolol ER to 120 mg for headache prevention.   We changed you to rizatriptan (Maxalt) 5 mg for migraine abortion.   It was a pleasure to see you today!

## 2023-03-24 ENCOUNTER — Other Ambulatory Visit: Payer: Self-pay | Admitting: Primary Care

## 2023-03-24 DIAGNOSIS — R519 Headache, unspecified: Secondary | ICD-10-CM

## 2023-03-30 ENCOUNTER — Other Ambulatory Visit: Payer: Self-pay | Admitting: Primary Care

## 2023-03-30 DIAGNOSIS — J4540 Moderate persistent asthma, uncomplicated: Secondary | ICD-10-CM

## 2023-03-30 DIAGNOSIS — E785 Hyperlipidemia, unspecified: Secondary | ICD-10-CM

## 2023-04-18 ENCOUNTER — Other Ambulatory Visit: Payer: Self-pay | Admitting: Primary Care

## 2023-04-18 DIAGNOSIS — J4540 Moderate persistent asthma, uncomplicated: Secondary | ICD-10-CM

## 2023-04-26 ENCOUNTER — Other Ambulatory Visit: Payer: Self-pay | Admitting: Primary Care

## 2023-04-26 DIAGNOSIS — R519 Headache, unspecified: Secondary | ICD-10-CM

## 2023-04-27 ENCOUNTER — Other Ambulatory Visit: Payer: Self-pay | Admitting: Primary Care

## 2023-04-27 DIAGNOSIS — E785 Hyperlipidemia, unspecified: Secondary | ICD-10-CM

## 2023-04-27 DIAGNOSIS — J4540 Moderate persistent asthma, uncomplicated: Secondary | ICD-10-CM

## 2023-04-27 DIAGNOSIS — I10 Essential (primary) hypertension: Secondary | ICD-10-CM

## 2023-05-03 ENCOUNTER — Other Ambulatory Visit: Payer: Self-pay | Admitting: Primary Care

## 2023-05-03 DIAGNOSIS — E1165 Type 2 diabetes mellitus with hyperglycemia: Secondary | ICD-10-CM

## 2023-05-30 ENCOUNTER — Other Ambulatory Visit: Payer: Self-pay | Admitting: Primary Care

## 2023-05-30 DIAGNOSIS — J302 Other seasonal allergic rhinitis: Secondary | ICD-10-CM

## 2023-05-30 DIAGNOSIS — I10 Essential (primary) hypertension: Secondary | ICD-10-CM

## 2023-06-08 ENCOUNTER — Other Ambulatory Visit: Payer: Self-pay | Admitting: Primary Care

## 2023-06-08 DIAGNOSIS — R519 Headache, unspecified: Secondary | ICD-10-CM

## 2023-07-19 ENCOUNTER — Ambulatory Visit: Payer: PRIVATE HEALTH INSURANCE | Admitting: Primary Care

## 2023-07-19 ENCOUNTER — Encounter: Payer: Self-pay | Admitting: Primary Care

## 2023-07-19 VITALS — BP 112/66 | HR 86 | Temp 97.1°F | Ht 64.0 in | Wt 183.0 lb

## 2023-07-19 DIAGNOSIS — R1013 Epigastric pain: Secondary | ICD-10-CM | POA: Diagnosis not present

## 2023-07-19 DIAGNOSIS — G8929 Other chronic pain: Secondary | ICD-10-CM

## 2023-07-19 DIAGNOSIS — D696 Thrombocytopenia, unspecified: Secondary | ICD-10-CM

## 2023-07-19 DIAGNOSIS — K219 Gastro-esophageal reflux disease without esophagitis: Secondary | ICD-10-CM

## 2023-07-19 DIAGNOSIS — R748 Abnormal levels of other serum enzymes: Secondary | ICD-10-CM

## 2023-07-19 DIAGNOSIS — M79641 Pain in right hand: Secondary | ICD-10-CM | POA: Diagnosis not present

## 2023-07-19 DIAGNOSIS — K7030 Alcoholic cirrhosis of liver without ascites: Secondary | ICD-10-CM

## 2023-07-19 LAB — CBC WITH DIFFERENTIAL/PLATELET
Basophils Absolute: 0.1 10*3/uL (ref 0.0–0.1)
Basophils Relative: 0.7 % (ref 0.0–3.0)
Eosinophils Absolute: 0.4 10*3/uL (ref 0.0–0.7)
Eosinophils Relative: 4.2 % (ref 0.0–5.0)
HCT: 37.9 % (ref 36.0–46.0)
Hemoglobin: 12.9 g/dL (ref 12.0–15.0)
Lymphocytes Relative: 29.2 % (ref 12.0–46.0)
Lymphs Abs: 2.5 10*3/uL (ref 0.7–4.0)
MCHC: 34 g/dL (ref 30.0–36.0)
MCV: 91.5 fL (ref 78.0–100.0)
Monocytes Absolute: 0.6 10*3/uL (ref 0.1–1.0)
Monocytes Relative: 7.4 % (ref 3.0–12.0)
Neutro Abs: 5 10*3/uL (ref 1.4–7.7)
Neutrophils Relative %: 58.5 % (ref 43.0–77.0)
Platelets: 130 10*3/uL — ABNORMAL LOW (ref 150.0–400.0)
RBC: 4.14 Mil/uL (ref 3.87–5.11)
RDW: 13.9 % (ref 11.5–15.5)
WBC: 8.5 10*3/uL (ref 4.0–10.5)

## 2023-07-19 LAB — COMPREHENSIVE METABOLIC PANEL
ALT: 19 U/L (ref 0–35)
AST: 22 U/L (ref 0–37)
Albumin: 4 g/dL (ref 3.5–5.2)
Alkaline Phosphatase: 92 U/L (ref 39–117)
BUN: 9 mg/dL (ref 6–23)
CO2: 26 meq/L (ref 19–32)
Calcium: 9.5 mg/dL (ref 8.4–10.5)
Chloride: 97 meq/L (ref 96–112)
Creatinine, Ser: 0.46 mg/dL (ref 0.40–1.20)
GFR: 109.58 mL/min (ref >60.00)
Glucose, Bld: 99 mg/dL (ref 70–99)
Potassium: 4.4 meq/L (ref 3.5–5.1)
Sodium: 132 meq/L — ABNORMAL LOW (ref 135–145)
Total Bilirubin: 0.5 mg/dL (ref 0.2–1.2)
Total Protein: 7.5 g/dL (ref 6.0–8.3)

## 2023-07-19 LAB — LIPASE: Lipase: 63 U/L — ABNORMAL HIGH (ref 11.0–59.0)

## 2023-07-19 MED ORDER — PREDNISONE 20 MG PO TABS: ORAL_TABLET | ORAL | 0 refills | Status: DC

## 2023-07-19 MED ORDER — OMEPRAZOLE 20 MG PO CPDR: 20.0000 mg | DELAYED_RELEASE_CAPSULE | Freq: Two times a day (BID) | ORAL | 0 refills | Status: DC

## 2023-07-19 NOTE — Assessment & Plan Note (Signed)
 Symptoms representative of carpal tunnel syndrome, discussed with patient today.  Start prednisone tablets. Take two tablets my mouth once daily in the morning for four days, then one tablet once daily in the morning for four days.  Discussed sports medicine evaluation for injections.  Consider EMG testing, she will think about this.

## 2023-07-19 NOTE — Assessment & Plan Note (Addendum)
 Symptoms suggestive of esophageal reflux. Less likely acute cholecystitis or pancreatitis.  We discussed common triggers of esophageal reflux.  Checking labs today including CMP, lipase, CBC.  Increase omeprazole to 20 mg twice daily as this is already helped recently. Close follow-up if no improvement.  Would recommend ultrasound at that point.  She will update.

## 2023-07-19 NOTE — Progress Notes (Signed)
 Subjective:    Patient ID: Alexandria Leblanc, female    DOB: Sep 19, 1970, 53 y.o.   MRN: [id number]  Abdominal Pain Associated symptoms include arthralgias and nausea. Pertinent negatives include no constipation, diarrhea or vomiting.    Alexandria Leblanc is a very pleasant 53 y.o. female with a significant medical history including hypertension, cholelithiasis without cholecystitis, alcoholic cirrhosis of liver without ascites, type 2 diabetes, thrombocytopenia, chronic back pain, hyperlipidemia, tobacco use, GERD, splenomegaly who presents today to discuss abdominal pain and hand pain.  1) Abdominal Pain: Symptom onset about one week ago with epigastric abdominal pain. She describes her pain as a sharp, pressure. She then developed esophageal and throat burning with reflux. Her symptoms are constant. She has noticed loose stools which are typical, this morning were more loose than usual.   Prior to symptom onset she ate a few bites of a sloppy joe sandwich.   She's been taking Pepto Bismol, Tums without improvement. She's compliant to her omeprazole 20 mg daily. Yesterday she took 20 mg in AM and 20 mg in PM.   She denies radiation of pain to RUQ, vomiting.   2) Hand Pain: Acute for the last 2 weeks. Symptoms include a burning type pain to the wrist, hand, and fingers. She has chronic numbness and pain to the hand, wears braces which help.   She has more recently been wrapping her elbow with an ace bandage with improvement. She has been doing her sister's hair recently, this requires a lot of fine motor movement.   She has never undergone testing for carpal tunnel syndrome.   Review of Systems  Gastrointestinal:  Positive for abdominal pain and nausea. Negative for constipation, diarrhea and vomiting.  Musculoskeletal:  Positive for arthralgias and joint swelling.  Neurological:  Positive for numbness.         Past Medical History:  Diagnosis Date   Alcohol abuse    Anxiety     Arthritis    Asthma    Carpal tunnel syndrome, bilateral    Depression    DJD (degenerative joint disease)    lower back   Gallstones    Genital warts    GERD (gastroesophageal reflux disease)    Hepatic cirrhosis (HCC)    Hypertension    Migraines    Obesity    Portal hypertension with esophageal varices (HCC)    Splenomegaly    Thrombocytopenia (HCC)    Type 2 diabetes mellitus (HCC)     Social History   Socioeconomic History   Marital status: Single    Spouse name: Not on file   Number of children: 0   Years of education: Not on file   Highest education level: GED or equivalent  Occupational History   Occupation: Conservation officer, nature  Tobacco Use   Smoking status: Every Day    Current packs/day: 1.00    Average packs/day: 1 pack/day for 35.0 years (35.0 ttl pk-yrs)    Types: Cigarettes   Smokeless tobacco: Never  Vaping Use   Vaping status: Never Used  Substance and Sexual Activity   Alcohol use: Yes    Alcohol/week: 35.0 standard drinks of alcohol    Types: 35 Cans of beer per week    Comment: 4 cans of beer 2x week   Drug use: No   Sexual activity: Not on file  Other Topics Concern   Not on file  Social History Narrative   Single; No children.; 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 1and half a day- smoking; 5 beers a week; no hard liqor.           Social Drivers of Corporate investment banker Strain: Low Risk  (07/14/2023)   Overall Financial Resource Strain (CARDIA)    Difficulty of Paying Living Expenses: Not very hard  Food Insecurity: No Food Insecurity (07/14/2023)   Hunger Vital Sign    Worried About Running Out of Food in the Last Year: Never true    Ran Out of Food in the Last Year: Never true  Transportation Needs: No Transportation Needs (07/14/2023)   PRAPARE - Administrator, Civil Service (Medical): No    Lack of Transportation (Non-Medical): No  Physical Activity: Inactive (07/14/2023)    Exercise Vital Sign    Days of Exercise per Week: 0 days    Minutes of Exercise per Session: 10 min  Stress: Stress Concern Present (07/14/2023)   [employer] of Occupational Health - Occupational Stress Questionnaire    Feeling of Stress : Very much  Social Connections: Socially Isolated (07/14/2023)   Social Connection and Isolation Panel [NHANES]    Frequency of Communication with Friends and Family: Once a week    Frequency of Social Gatherings with Friends and Family: Never    Attends Religious Services: Never    Database administrator or Organizations: No    Attends Engineer, structural: Not on file    Marital Status: Never married  Intimate Partner Violence: Not on file    Past Surgical History:  Procedure Laterality Date   BIOPSY  10/22/2021   Procedure: BIOPSY;  Surgeon: Imogene Burn, MD;  Location: [facility];  Service: Gastroenterology;;  EGD and COLON   COLONOSCOPY WITH PROPOFOL N/A 10/22/2021   Procedure: COLONOSCOPY WITH PROPOFOL;  Surgeon: Imogene Burn, MD;  Location: [facility];  Service: Gastroenterology;  Laterality: N/A;   ESOPHAGOGASTRODUODENOSCOPY (EGD) WITH PROPOFOL N/A 10/22/2021   Procedure: ESOPHAGOGASTRODUODENOSCOPY (EGD) WITH PROPOFOL;  Surgeon: Imogene Burn, MD;  Location: [facility];  Service: Gastroenterology;  Laterality: N/A;   LESION REMOVAL N/A 02/04/2022   Procedure: EXCISION OF PERIANAL LESION x2;  Surgeon: Andria Meuse, MD;  Location: [facility];  Service: General;  Laterality: N/A;   POLYPECTOMY  10/22/2021   Procedure: POLYPECTOMY;  Surgeon: Imogene Burn, MD;  Location: [facility];  Service: Gastroenterology;;   RECTAL EXAM UNDER ANESTHESIA N/A 02/04/2022   Procedure: ANORECTAL EXAM UNDER ANESTHESIA;  Surgeon: Andria Meuse, MD;  Location: [facility];  Service: General;  Laterality: N/A;    Family History  Problem Relation Age of Onset   Heart disease Mother     Hypertension Mother    Dementia Mother    Diabetes Mother    Heart attack Mother    COPD Father    Hypertension Father    Alzheimer's disease Sister    Hyperlipidemia Brother    Diabetes Brother    Prostate cancer Paternal Uncle    Colon cancer Paternal Uncle     Allergies  Allergen Reactions   Penicillins Hives and Itching   Sulfa Antibiotics Hives    Current Outpatient Medications on File Prior to Visit  Medication Sig Dispense Refill   acyclovir (ZOVIRAX) 400 MG tablet TAKE ONE TABLET BY MOUTH TWICE A DAY FOR HERPES PREVENTION. 180 tablet 2   albuterol (VENTOLIN HFA) 108 (90 Base) MCG/ACT inhaler INHALE TWO PUFFS INTO LUNGS EVERY SIX  HOURS AS NEEDED FOR WHEEZING OR SHORTNESS OF BREATH 8.5 g 0   amLODipine (NORVASC) 5 MG tablet TAKE ONE TABLET (5 MG TOTAL) BY MOUTH DAILY FOR BLOOD PRESSURE. 90 tablet 2   Blood Glucose Monitoring Suppl (FREESTYLE LITE) w/Device KIT 1 each by Does not apply route as directed. DX E11.9 1 kit 0   cetirizine (ZYRTEC) 10 MG tablet Take 1 tablet (10 mg total) by mouth daily. For allergies 90 tablet 3   Dulaglutide (TRULICITY) 1.5 MG/0.5ML SOPN Inject 1.5 mg into the skin once a week. for diabetes. 6 mL 1   fluticasone (FLONASE) 50 MCG/ACT nasal spray SPRAY ONE SPRAY INTO BOTH NOSTRILS TWO TIMES DAILY 48 mL 2   fluticasone furoate-vilanterol (BREO ELLIPTA) 100-25 MCG/ACT AEPB Inhale 1 puff into the lungs daily. 1 each 11   glipiZIDE (GLUCOTROL XL) 10 MG 24 hr tablet TAKE ONE TABLET BY MOUTH ONCE DAILY WITH BREAKFAST FOR DIABETES 90 tablet 0   glucose blood test strip Use to test blood sugars up to three times a day Dx E11.9 200 each 3   hydrochlorothiazide (HYDRODIURIL) 25 MG tablet Take 1 tablet (25 mg total) by mouth daily. for blood pressure. 90 tablet 3   Lancets (FREESTYLE) lancets 1 each by Other route 3 (three) times daily. 100 each 2   metFORMIN (GLUCOPHAGE-XR) 500 MG 24 hr tablet TAKE TWO TABLETS (1,000 MG TOTAL) BY MOUTH TWO TIMES DAILY WITH A  MEAL FOR DIABETES. 360 tablet 1   montelukast (SINGULAIR) 10 MG tablet TAKE ONE TABLET BY MOUTH EVERY NIGHT AT BEDTIME FOR ALLERGIES AND ASTHMA 90 tablet 1   olmesartan (BENICAR) 40 MG tablet TAKE ONE TABLET BY MOUTH ONCE A DAY FOR BLOOD PRESSURE 90 tablet 1   ondansetron (ZOFRAN) 4 MG tablet Take 1 tablet (4 mg total) by mouth every 8 (eight) hours as needed for nausea or vomiting. 20 tablet 0   pravastatin (PRAVACHOL) 40 MG tablet TAKE ONE TABLET BY MOUTH ONCE A DAY FOR CHOLESTEROL 90 tablet 1   propranolol ER (INDERAL LA) 120 MG 24 hr capsule TAKE ONE CAPSULE (120 MG TOTAL) BY MOUTH AT BEDTIME. FOR HEADACHE PREVENTION 90 capsule 0   rizatriptan (MAXALT) 5 MG tablet Take 1 tablet by mouth at migraine onset. May repeat in 2 hours if needed 10 tablet 0   No current facility-administered medications on file prior to visit.    BP 112/66   Pulse 86   Temp (!) 97.1 F (36.2 C) (Temporal)   Ht 5\' 4"  (1.626 m)   Wt 183 lb (83 kg)   LMP  (LMP Unknown)   SpO2 96%   BMI 31.41 kg/m  Objective:   Physical Exam Cardiovascular:     Rate and Rhythm: Normal rate.  Pulmonary:     Effort: Pulmonary effort is normal.  Abdominal:     General: Bowel sounds are normal.     Palpations: Abdomen is soft.     Tenderness: There is abdominal tenderness in the epigastric area.  Musculoskeletal:     Right hand: No swelling. Decreased range of motion.     Left hand: No swelling.           Assessment & Plan:  Epigastric pain Assessment & Plan: Symptoms suggestive of esophageal reflux. Less likely acute cholecystitis or pancreatitis.  We discussed common triggers of esophageal reflux.  Checking labs today including CMP, lipase, CBC.  Increase omeprazole to 20 mg twice daily as this is already helped recently. Close follow-up if no  improvement.  Would recommend ultrasound at that point.  She will update.  Orders: -     Comprehensive metabolic panel -     Lipase -     CBC with  Differential/Platelet  Chronic pain of right hand Assessment & Plan: Symptoms representative of carpal tunnel syndrome, discussed with patient today.  Start prednisone tablets. Take two tablets my mouth once daily in the morning for four days, then one tablet once daily in the morning for four days.  Discussed sports medicine evaluation for injections.  Consider EMG testing, she will think about this.  Orders: -     predniSONE; Take two tablets my mouth once daily in the morning for four days, then one tablet once daily in the morning for four days.  Dispense: 12 tablet; Refill: 0  Gastroesophageal reflux disease, unspecified whether esophagitis present -     Omeprazole; Take 1 capsule (20 mg total) by mouth 2 (two) times daily before a meal. for heartburn.  Dispense: 180 capsule; Refill: 0        Doreene Nest, NP

## 2023-07-19 NOTE — Patient Instructions (Addendum)
 Start prednisone tablets. Take two tablets my mouth once daily in the morning for four days, then one tablet once daily in the morning for four days.   Increase your omeprazole to 20 mg twice daily for heartburn. Please keep me updated regarding your symptoms.  Stop by the lab prior to leaving today. I will notify you of your results once received.   For your back pain, call:  [facility] - Physiatry Open [address] [place], [place] [address] Office: [phone]  Fax: [phone]

## 2023-07-26 ENCOUNTER — Other Ambulatory Visit: Payer: Self-pay | Admitting: Primary Care

## 2023-07-26 DIAGNOSIS — E1165 Type 2 diabetes mellitus with hyperglycemia: Secondary | ICD-10-CM

## 2023-08-01 ENCOUNTER — Other Ambulatory Visit: Payer: Self-pay | Admitting: Primary Care

## 2023-08-01 DIAGNOSIS — A6000 Herpesviral infection of urogenital system, unspecified: Secondary | ICD-10-CM

## 2023-08-01 DIAGNOSIS — I10 Essential (primary) hypertension: Secondary | ICD-10-CM

## 2023-08-09 ENCOUNTER — Other Ambulatory Visit: Payer: Self-pay | Admitting: Primary Care

## 2023-08-09 DIAGNOSIS — R519 Headache, unspecified: Secondary | ICD-10-CM

## 2023-08-09 MED ORDER — RIZATRIPTAN BENZOATE 5 MG PO TABS: ORAL_TABLET | ORAL | 0 refills | Status: AC

## 2023-08-11 ENCOUNTER — Other Ambulatory Visit: Payer: Self-pay | Admitting: Primary Care

## 2023-08-11 DIAGNOSIS — R519 Headache, unspecified: Secondary | ICD-10-CM

## 2023-08-18 ENCOUNTER — Other Ambulatory Visit: Payer: Self-pay | Admitting: Primary Care

## 2023-08-18 DIAGNOSIS — E1165 Type 2 diabetes mellitus with hyperglycemia: Secondary | ICD-10-CM

## 2023-08-22 ENCOUNTER — Other Ambulatory Visit: Payer: Self-pay | Admitting: Primary Care

## 2023-08-22 DIAGNOSIS — J4540 Moderate persistent asthma, uncomplicated: Secondary | ICD-10-CM

## 2023-08-22 MED ORDER — ALBUTEROL SULFATE HFA 108 (90 BASE) MCG/ACT IN AERS: INHALATION_SPRAY | RESPIRATORY_TRACT | 0 refills | Status: DC

## 2023-08-29 ENCOUNTER — Other Ambulatory Visit: Payer: Self-pay | Admitting: Primary Care

## 2023-08-29 DIAGNOSIS — R519 Headache, unspecified: Secondary | ICD-10-CM

## 2023-09-12 NOTE — Telephone Encounter (Signed)
 Alexandria Leblanc, this patient is on PAP with [facility] for [facility] and is needing renewal. Can you help?

## 2023-09-13 ENCOUNTER — Telehealth: Payer: Self-pay

## 2023-09-13 NOTE — Telephone Encounter (Signed)
 Gave pt a call pt request a PAP for [employer]([facility]) will mail out pt application today and will be faxing provider portion.

## 2023-09-14 ENCOUNTER — Other Ambulatory Visit: Payer: Self-pay | Admitting: Primary Care

## 2023-09-14 DIAGNOSIS — J4540 Moderate persistent asthma, uncomplicated: Secondary | ICD-10-CM

## 2023-09-20 ENCOUNTER — Ambulatory Visit: Payer: PRIVATE HEALTH INSURANCE | Admitting: Primary Care

## 2023-09-20 ENCOUNTER — Other Ambulatory Visit: Payer: Self-pay | Admitting: Primary Care

## 2023-09-20 DIAGNOSIS — E1165 Type 2 diabetes mellitus with hyperglycemia: Secondary | ICD-10-CM

## 2023-09-26 ENCOUNTER — Other Ambulatory Visit: Payer: Self-pay | Admitting: Primary Care

## 2023-09-26 DIAGNOSIS — E1165 Type 2 diabetes mellitus with hyperglycemia: Secondary | ICD-10-CM

## 2023-09-27 ENCOUNTER — Encounter: Payer: Self-pay | Admitting: Primary Care

## 2023-09-27 ENCOUNTER — Ambulatory Visit: Payer: PRIVATE HEALTH INSURANCE | Admitting: Primary Care

## 2023-09-27 VITALS — BP 160/100 | HR 69 | Temp 98.8°F | Ht 64.0 in | Wt 186.8 lb

## 2023-09-27 DIAGNOSIS — Z7984 Long term (current) use of oral hypoglycemic drugs: Secondary | ICD-10-CM | POA: Diagnosis not present

## 2023-09-27 DIAGNOSIS — Z794 Long term (current) use of insulin: Secondary | ICD-10-CM

## 2023-09-27 DIAGNOSIS — R102 Pelvic and perineal pain: Secondary | ICD-10-CM

## 2023-09-27 DIAGNOSIS — E1165 Type 2 diabetes mellitus with hyperglycemia: Secondary | ICD-10-CM | POA: Diagnosis not present

## 2023-09-27 LAB — POCT GLYCOSYLATED HEMOGLOBIN (HGB A1C): Hemoglobin A1C: 6.6 % — AB (ref 4.0–5.6)

## 2023-09-27 LAB — POC URINALSYSI DIPSTICK (AUTOMATED)
Bilirubin, UA: NEGATIVE
Blood, UA: NEGATIVE
Glucose, UA: NEGATIVE
Ketones, UA: NEGATIVE
Leukocytes, UA: NEGATIVE
Nitrite, UA: NEGATIVE
Protein, UA: NEGATIVE
Spec Grav, UA: <=1.005 — AB (ref 1.010–1.025)
Urobilinogen, UA: 0.2 U/dL
pH, UA: 8.5 — AB (ref 5.0–8.0)

## 2023-09-27 MED ORDER — TRULICITY 3 MG/0.5ML ~~LOC~~ SOAJ: 3.0000 mg | SUBCUTANEOUS | 1 refills | Status: DC

## 2023-09-27 NOTE — Progress Notes (Signed)
 Subjective:    Patient ID: Alexandria Leblanc, female    DOB: May 01, 1971, 53 y.o.   MRN: [id number]  HPI  Alexandria Leblanc is a very pleasant 53 y.o. female with a history of hypertension, asthma, GERD, alcoholic cirrhosis, type 2 diabetes,, cytopenia, chronic back pain, tobacco use, splenomegaly who presents today for follow-up of diabetes.  1) Type 2 Diabetes: Current medications include: Glipizide  XL 10 mg daily, Trulicity  1.5 mg weekly, metformin  ER 1000 mg twice daily. She denies diarrhea, nausea, abdominal pain.   She is checking her blood glucose 0 times daily.  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 Chips, crackers, snacks throughout the day. 1 bottle of water daily recently, mostly drinks diet mountain dew.    Exercise: None  BP Readings from Last 3 Encounters:  09/27/23 (!) 160/100  07/19/23 112/66  03/22/23 136/64   2 Pelvic Pressure: Symptom onset today with suprapubic pressure. She denies dysuria, hematuria, increased urinary frequency. She questions if she has a UTI. She did pick up a 5 gallon water jug today after getting out of the shower. Thinks she over lifted. She denies bulging to the abdomen.     Review of Systems  Eyes:  Positive for visual disturbance.  Respiratory:  Negative for shortness of breath.   Cardiovascular:  Negative for chest pain.  Gastrointestinal:  Negative for abdominal pain, constipation, diarrhea and nausea.  Genitourinary:  Negative for dysuria and frequency.  Neurological:  Positive for numbness.         Past Medical History:  Diagnosis Date   Alcohol abuse    Anxiety    Arthritis    Asthma    Carpal tunnel syndrome, bilateral    Depression    DJD (degenerative joint disease)    lower back   Gallstones    Genital warts    GERD  (gastroesophageal reflux disease)    Hepatic cirrhosis (HCC)    Hypertension    Migraines    Obesity    Portal hypertension with esophageal varices (HCC)    Splenomegaly    Thrombocytopenia (HCC)    Type 2 diabetes mellitus (HCC)     Social History   Socioeconomic History   Marital status: Single    Spouse name: Not on file   Number of children: 0   Years of education: Not on file   Highest education level: GED or equivalent  Occupational History   Occupation: Conservation officer, nature  Tobacco Use   Smoking status: Every Day    Current packs/day: 1.00    Average packs/day: 1 pack/day for 35.0 years (35.0 ttl pk-yrs)    Types: Cigarettes   Smokeless tobacco: Never  Vaping Use   Vaping status: Never Used  Substance and Sexual Activity   Alcohol use: Yes    Alcohol/week: 35.0 standard drinks of alcohol    Types: 35 Cans of beer per week    Comment: 4 cans of beer 2x week   Drug use: No   Sexual activity: Not on file  Other Topics Concern   Not on file  Social History Narrative   Single; No children.; 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 1and half a day- smoking; 5 beers a week; no hard liqor.  Social Drivers of Corporate investment banker Strain: Low Risk  (07/14/2023)   Overall Financial Resource Strain (CARDIA)    Difficulty of Paying Living Expenses: Not very hard  Food Insecurity: No Food Insecurity (07/14/2023)   Hunger Vital Sign    Worried About Running Out of Food in the Last Year: Never true    Ran Out of Food in the Last Year: Never true  Transportation Needs: No Transportation Needs (07/14/2023)   PRAPARE - Administrator, Civil Service (Medical): No    Lack of Transportation (Non-Medical): No  Physical Activity: Inactive (07/14/2023)   Exercise Vital Sign    Days of Exercise per Week: 0 days    Minutes of Exercise per Session: 10 min  Stress: Stress Concern Present (07/14/2023)   [employer] of Occupational Health - Occupational Stress Questionnaire    Feeling of Stress : Very much  Social Connections: Socially Isolated (07/14/2023)   Social Connection and Isolation Panel [NHANES]    Frequency of Communication with Friends and Family: Once a week    Frequency of Social Gatherings with Friends and Family: Never    Attends Religious Services: Never    Database administrator or Organizations: No    Attends Engineer, structural: Not on file    Marital Status: Never married  Intimate Partner Violence: Not on file    Past Surgical History:  Procedure Laterality Date   BIOPSY  10/22/2021   Procedure: BIOPSY;  Surgeon: Daina Drum, MD;  Location: [facility];  Service: Gastroenterology;;  EGD and COLON   COLONOSCOPY WITH PROPOFOL  N/A 10/22/2021   Procedure: COLONOSCOPY WITH PROPOFOL ;  Surgeon: Daina Drum, MD;  Location: [facility];  Service: Gastroenterology;  Laterality: N/A;   ESOPHAGOGASTRODUODENOSCOPY (EGD) WITH PROPOFOL  N/A 10/22/2021   Procedure: ESOPHAGOGASTRODUODENOSCOPY (EGD) WITH PROPOFOL ;  Surgeon: Daina Drum, MD;  Location: [facility];  Service: Gastroenterology;  Laterality: N/A;   LESION REMOVAL N/A 02/04/2022   Procedure: EXCISION OF PERIANAL LESION x2;  Surgeon: Melvenia Stabs, MD;  Location: [facility];  Service: General;  Laterality: N/A;   POLYPECTOMY  10/22/2021   Procedure: POLYPECTOMY;  Surgeon: Daina Drum, MD;  Location: [facility];  Service: Gastroenterology;;   RECTAL EXAM UNDER ANESTHESIA N/A 02/04/2022   Procedure: ANORECTAL EXAM UNDER ANESTHESIA;  Surgeon: Melvenia Stabs, MD;  Location: [facility];  Service: General;  Laterality: N/A;    Family History  Problem Relation Age of Onset   Heart disease Mother    Hypertension Mother    Dementia Mother    Diabetes Mother    Heart attack Mother    COPD Father    Hypertension Father    Alzheimer's disease Sister     Hyperlipidemia Brother    Diabetes Brother    Prostate cancer Paternal Uncle    Colon cancer Paternal Uncle     Allergies  Allergen Reactions   Penicillins Hives and Itching   Sulfa Antibiotics Hives    Current Outpatient Medications on File Prior to Visit  Medication Sig Dispense Refill   acyclovir  (ZOVIRAX ) 400 MG tablet TAKE ONE TABLET BY MOUTH TWICE A DAY FOR HERPES PREVENTION. 180 tablet 0   albuterol  (VENTOLIN  HFA) 108 (90 Base) MCG/ACT inhaler INHALE TWO PUFFS INTO LUNGS EVERY SIX HOURS AS NEEDED FOR WHEEZING OR SHORTNESS OF BREATH 8.5 g 0   amLODipine  (NORVASC ) 5 MG tablet TAKE ONE TABLET (5 MG TOTAL) BY MOUTH DAILY FOR  BLOOD PRESSURE. 90 tablet 2   Blood Glucose Monitoring Suppl (FREESTYLE LITE) w/Device KIT 1 each by Does not apply route as directed. DX E11.9 1 kit 0   cetirizine  (ZYRTEC ) 10 MG tablet Take 1 tablet (10 mg total) by mouth daily. For allergies 90 tablet 3   fluticasone  (FLONASE ) 50 MCG/ACT nasal spray SPRAY ONE SPRAY INTO BOTH NOSTRILS TWO TIMES DAILY 48 mL 2   fluticasone  furoate-vilanterol (BREO ELLIPTA ) 100-25 MCG/ACT AEPB Inhale 1 puff into the lungs daily. 1 each 11   glipiZIDE  (GLUCOTROL  XL) 10 MG 24 hr tablet TAKE ONE TABLET BY MOUTH ONCE DAILY WITH BREAKFAST FOR DIABETES 90 tablet 0   glucose blood test strip Use to test blood sugars up to three times a day Dx E11.9 200 each 3   hydrochlorothiazide  (HYDRODIURIL ) 25 MG tablet TAKE ONE TABLET (25 MG TOTAL) BY MOUTH DAILY. FOR BLOOD PRESSURE. 90 tablet 0   Lancets (FREESTYLE) lancets 1 each by Other route 3 (three) times daily. 100 each 2   metFORMIN  (GLUCOPHAGE -XR) 500 MG 24 hr tablet TAKE TWO TABLETS (1,000 MG TOTAL) BY MOUTH TWO TIMES DAILY WITH A MEAL FOR DIABETES. 360 tablet 0   montelukast  (SINGULAIR ) 10 MG tablet TAKE ONE TABLET BY MOUTH EVERY NIGHT AT BEDTIME FOR ALLERGIES AND ASTHMA 90 tablet 1   olmesartan  (BENICAR ) 40 MG tablet TAKE ONE TABLET BY MOUTH ONCE A DAY FOR BLOOD PRESSURE 90 tablet 1    omeprazole  (PRILOSEC) 20 MG capsule Take 1 capsule (20 mg total) by mouth 2 (two) times daily before a meal. for heartburn. 180 capsule 0   ondansetron  (ZOFRAN ) 4 MG tablet Take 1 tablet (4 mg total) by mouth every 8 (eight) hours as needed for nausea or vomiting. 20 tablet 0   pravastatin  (PRAVACHOL ) 40 MG tablet TAKE ONE TABLET BY MOUTH ONCE A DAY FOR CHOLESTEROL 90 tablet 1   predniSONE  (DELTASONE ) 20 MG tablet Take two tablets my mouth once daily in the morning for four days, then one tablet once daily in the morning for four days. 12 tablet 0   propranolol  ER (INDERAL  LA) 120 MG 24 hr capsule TAKE ONE CAPSULE (120 MG TOTAL) BY MOUTH AT BEDTIME. FOR HEADACHE PREVENTION 90 capsule 0   rizatriptan  (MAXALT ) 5 MG tablet Take 1 tablet by mouth at migraine onset. May repeat in 2 hours if needed 10 tablet 0   No current facility-administered medications on file prior to visit.    BP (!) 160/100   Pulse 69   Temp 98.8 F (37.1 C) (Oral)   Ht 5\' 4"  (1.626 m)   Wt 186 lb 12.8 oz (84.7 kg)   LMP  (LMP Unknown)   SpO2 94%   BMI 32.06 kg/m  Objective:   Physical Exam Cardiovascular:     Rate and Rhythm: Normal rate and regular rhythm.  Pulmonary:     Effort: Pulmonary effort is normal.     Breath sounds: Normal breath sounds.  Musculoskeletal:     Cervical back: Neck supple.  Skin:    General: Skin is warm and dry.  Neurological:     Mental Status: She is alert and oriented to person, place, and time.  Psychiatric:        Mood and Affect: Mood normal.           Assessment & Plan:  Type 2 diabetes mellitus with hyperglycemia, without long-term current use of insulin  (HCC) Assessment & Plan: Improved with A1C of 6.6 today!  Increase Trulicity  to  3 mg weekly for better appetite control Continue metformin  ER 1000 mg BID, Glipizide  XL 10 mg daily.   Foot exam today. She will schedule eye exam.   Follow up in 6 months.   Orders: -     POCT glycosylated hemoglobin (Hb A1C) -      Trulicity ; Inject 3 mg as directed once a week. for diabetes.  Dispense: 6 mL; Refill: 1 -     Microalbumin / creatinine urine ratio  Suprapubic pressure Assessment & Plan: UA today negative. She will keep an eye on symptoms and update if they persist. Avoid heavy lifting.   Orders: -     POCT Urinalysis Dipstick (Automated)        Gabriel John, NP

## 2023-09-27 NOTE — Patient Instructions (Signed)
 We increased the dose of your Trulicity  to 3 mg weekly for diabetes.  Continue metformin  and glipizide  as prescribed.  Please notify me if you experience drops in your blood sugars.  Please schedule a physical to meet with me in 6 months.   It was a pleasure to see you today!

## 2023-09-27 NOTE — Assessment & Plan Note (Signed)
 UA today negative. She will keep an eye on symptoms and update if they persist. Avoid heavy lifting.

## 2023-09-27 NOTE — Assessment & Plan Note (Signed)
 Improved with A1C of 6.6 today!  Increase Trulicity  to 3 mg weekly for better appetite control Continue metformin  ER 1000 mg BID, Glipizide  XL 10 mg daily.   Foot exam today. She will schedule eye exam.   Follow up in 6 months.

## 2023-09-28 ENCOUNTER — Other Ambulatory Visit: Payer: Self-pay | Admitting: Primary Care

## 2023-09-28 DIAGNOSIS — E1165 Type 2 diabetes mellitus with hyperglycemia: Secondary | ICD-10-CM

## 2023-09-28 LAB — MICROALBUMIN / CREATININE URINE RATIO
Creatinine,U: 36.8 mg/dL
Microalb Creat Ratio: 90.4 mg/g — ABNORMAL HIGH (ref 0.0–30.0)
Microalb, Ur: 3.3 mg/dL — ABNORMAL HIGH (ref 0.0–1.9)

## 2023-10-03 ENCOUNTER — Other Ambulatory Visit (INDEPENDENT_AMBULATORY_CARE_PROVIDER_SITE_OTHER): Payer: PRIVATE HEALTH INSURANCE

## 2023-10-03 ENCOUNTER — Encounter: Payer: Self-pay | Admitting: Physician Assistant

## 2023-10-03 ENCOUNTER — Ambulatory Visit: Payer: PRIVATE HEALTH INSURANCE | Admitting: Physician Assistant

## 2023-10-03 VITALS — BP 122/68 | HR 66 | Ht 64.0 in | Wt 182.0 lb

## 2023-10-03 DIAGNOSIS — I2510 Atherosclerotic heart disease of native coronary artery without angina pectoris: Secondary | ICD-10-CM | POA: Diagnosis not present

## 2023-10-03 DIAGNOSIS — Z860100 Personal history of colon polyps, unspecified: Secondary | ICD-10-CM

## 2023-10-03 DIAGNOSIS — K52831 Collagenous colitis: Secondary | ICD-10-CM | POA: Diagnosis not present

## 2023-10-03 DIAGNOSIS — E119 Type 2 diabetes mellitus without complications: Secondary | ICD-10-CM | POA: Diagnosis not present

## 2023-10-03 DIAGNOSIS — F17210 Nicotine dependence, cigarettes, uncomplicated: Secondary | ICD-10-CM

## 2023-10-03 DIAGNOSIS — K7460 Unspecified cirrhosis of liver: Secondary | ICD-10-CM | POA: Diagnosis not present

## 2023-10-03 DIAGNOSIS — R933 Abnormal findings on diagnostic imaging of other parts of digestive tract: Secondary | ICD-10-CM

## 2023-10-03 DIAGNOSIS — E669 Obesity, unspecified: Secondary | ICD-10-CM

## 2023-10-03 DIAGNOSIS — K7030 Alcoholic cirrhosis of liver without ascites: Secondary | ICD-10-CM

## 2023-10-03 DIAGNOSIS — D696 Thrombocytopenia, unspecified: Secondary | ICD-10-CM

## 2023-10-03 DIAGNOSIS — Z7984 Long term (current) use of oral hypoglycemic drugs: Secondary | ICD-10-CM

## 2023-10-03 DIAGNOSIS — E1165 Type 2 diabetes mellitus with hyperglycemia: Secondary | ICD-10-CM

## 2023-10-03 DIAGNOSIS — Z6831 Body mass index (BMI) 31.0-31.9, adult: Secondary | ICD-10-CM

## 2023-10-03 DIAGNOSIS — K219 Gastro-esophageal reflux disease without esophagitis: Secondary | ICD-10-CM

## 2023-10-03 LAB — COMPREHENSIVE METABOLIC PANEL WITH GFR
ALT: 26 U/L (ref 0–35)
AST: 26 U/L (ref 0–37)
Albumin: 4.3 g/dL (ref 3.5–5.2)
Alkaline Phosphatase: 90 U/L (ref 39–117)
BUN: 8 mg/dL (ref 6–23)
CO2: 26 meq/L (ref 19–32)
Calcium: 9.5 mg/dL (ref 8.4–10.5)
Chloride: 94 meq/L — ABNORMAL LOW (ref 96–112)
Creatinine, Ser: 0.51 mg/dL (ref 0.40–1.20)
GFR: 106.74 mL/min (ref >60.00)
Glucose, Bld: 130 mg/dL — ABNORMAL HIGH (ref 70–99)
Potassium: 3.7 meq/L (ref 3.5–5.1)
Sodium: 130 meq/L — ABNORMAL LOW (ref 135–145)
Total Bilirubin: 0.5 mg/dL (ref 0.2–1.2)
Total Protein: 8.3 g/dL (ref 6.0–8.3)

## 2023-10-03 LAB — CBC WITH DIFFERENTIAL/PLATELET
Basophils Absolute: 0.1 10*3/uL (ref 0.0–0.1)
Basophils Relative: 1 % (ref 0.0–3.0)
Eosinophils Absolute: 0.3 10*3/uL (ref 0.0–0.7)
Eosinophils Relative: 3.6 % (ref 0.0–5.0)
HCT: 39.1 % (ref 36.0–46.0)
Hemoglobin: 13.5 g/dL (ref 12.0–15.0)
Lymphocytes Relative: 23.1 % (ref 12.0–46.0)
Lymphs Abs: 1.7 10*3/uL (ref 0.7–4.0)
MCHC: 34.6 g/dL (ref 30.0–36.0)
MCV: 91 fl (ref 78.0–100.0)
Monocytes Absolute: 0.7 10*3/uL (ref 0.1–1.0)
Monocytes Relative: 9.1 % (ref 3.0–12.0)
Neutro Abs: 4.7 10*3/uL (ref 1.4–7.7)
Neutrophils Relative %: 63.2 % (ref 43.0–77.0)
Platelets: 137 10*3/uL — ABNORMAL LOW (ref 150.0–400.0)
RBC: 4.29 Mil/uL (ref 3.87–5.11)
RDW: 14.4 % (ref 11.5–15.5)
WBC: 7.4 10*3/uL (ref 4.0–10.5)

## 2023-10-03 LAB — PROTIME-INR
INR: 1.1 ratio — ABNORMAL HIGH (ref 0.8–1.0)
Prothrombin Time: 11.3 s (ref 9.6–13.1)

## 2023-10-03 LAB — AMMONIA: Ammonia: 35 umol/L (ref 11–35)

## 2023-10-03 LAB — GAMMA GT: GGT: 205 U/L — ABNORMAL HIGH (ref 7–51)

## 2023-10-03 NOTE — Telephone Encounter (Signed)
 Receive pt portion today and faxed it to [employer] will follow up in a few days to make sure the company received it.

## 2023-10-03 NOTE — Patient Instructions (Signed)
 Your provider has requested that you go to the basement level for lab work before leaving today. Press "B" on the elevator. The lab is located at the first door on the left as you exit the elevator.  You have been scheduled for an abdominal ultrasound at [facility] (1st floor of hospital) on 10/12/23 at 10:30 AM. Please arrive 30 minutes prior to your appointment for registration. Make certain not to have anything to eat or drink AFTER MIDNIGHT prior to your appointment. Should you need to reschedule your appointment, please contact radiology at [phone]. This test typically takes about 30 minutes to perform. . Follow up in 1 year VISIT SUMMARY:  Today, we reviewed your history of cirrhosis and other health conditions. We discussed your recent procedures and current symptoms, including your experience with diarrhea, nausea, and reflux. We also talked about your medication regimen and lifestyle factors such as smoking and alcohol consumption. We have outlined a plan to monitor and manage your conditions moving forward.  YOUR PLAN:  -CIRRHOSIS WITH PORTAL HYPERTENSIVE GASTROPATHY: Cirrhosis is a condition where the liver is scarred and its function is impaired. Portal hypertensive gastropathy is a complication of cirrhosis that affects the stomach lining. We will monitor your liver health with an abdominal ultrasound and liver function tests. Continue taking propranolol  and maintain a low-salt diet.  -HEPATOMEGALY: Hepatomegaly means an enlarged liver, which in your case is related to cirrhosis. We will keep an eye on this as part of your cirrhosis management.  -COLLAGENOUS COLITIS: Collagenous colitis is a condition that causes diarrhea due to inflammation of the colon lining. If your symptoms worsen, we may consider treatments like budesonide , Pepto Bismol, or Imodium. Consider stopping the benicar   -GASTROESOPHAGEAL REFLUX DISEASE (GERD): GERD is a condition where stomach acid  frequently flows back into the tube connecting your mouth and stomach. Continue taking omeprazole  twice daily and monitor for any worsening symptoms.  -TOBACCO USE DISORDER: Continued smoking affects your overall health and can worsen shortness of breath and sleep issues. Quitting smoking would be beneficial for your health.  INSTRUCTIONS:  Please schedule an abdominal ultrasound to monitor your liver health and check liver function tests and clotting factors. Continue with your current medications and dietary recommendations. If your reflux symptoms worsen or you experience difficulty swallowing, schedule an endoscopy. Consider switching olmesartan  if diarrhea becomes problematic. Follow up with us  as needed to manage your conditions.  You should have an imaging study every 6 months to monitor for the development of hepatocellular carcinoma (liver cancer). The risk is low, but, if liver cancer is diagnosed early, there are better treatment options. Will get AFP, INR, CBC, and CMET.  Will follow up in 6 months to check labs and evaluate.   I recommend a high-protein, primarily plant-based diet. Avoid red meat. Work to maintain a health weight. Weigh yourself daily- call if you have weight gain of greater than 5 lbs in a 1-2 days, leg swelling, or new swelling in your abdomen. Minimize salt intake- VERY important. Please do not consume more than 2000 mg of sodium every day. Monitor your blood pressure at home.  Stay active. Weight-based exercise for 30 minutes at least 3 days a week is recommended. I recommend that you not drink any alcohol including beer, wine, liquor, and non-alcoholic beer.   You are at increased risk of osteopenia and osteoporosis. You should be screened for these metabolic bone diseases if you have not already had the testing performed.  Cirrhosis Cirrhosis  is long-term (chronic) liver injury. The liver is the body's largest internal organ, and it performs many functions.  It converts food into energy, removes toxic material from the blood, makes important proteins, and absorbs necessary vitamins from food. In cirrhosis, healthy liver cells are replaced by scar tissue. This prevents blood from flowing through the liver and makes it difficult for the liver to complete its functions. What are the causes? Common causes of this condition are hepatitis C and long-term alcohol abuse. Other causes include: Nonalcoholic fatty liver disease (NAFLD). This happens when fat is deposited in the liver by causes other than alcohol. Hepatitis B infection. Autoimmune hepatitis. In this condition, the body's defense system (immune system) mistakenly attacks the liver cells, causing inflammation. Diseases that cause blockage of ducts inside the liver. Inherited liver diseases, such as hemochromatosis. This is one of the most common inherited liver diseases. In this disease, deposits of iron collect in the liver and other organs. Reactions to certain long-term medicines, such as amiodarone, a heart medicine. Parasitic infections. These include schistosomiasis, which is caused by a flatworm. Long-term contact to certain toxins. These toxins include certain organic solvents, such as toluene and chloroform. What increases the risk? You are more likely to develop this condition if: You have certain types of viral hepatitis. You abuse alcohol, especially if you are female. You are overweight. You use IV drugs and share needles. You have unprotected sex with someone who has viral hepatitis. What are the signs or symptoms? You may not have any signs and symptoms at first. Symptoms may not develop until the damage to your liver starts to get worse. Early symptoms may include: Weakness and tiredness (fatigue). Changes in sleep patterns or having trouble sleeping. Itchiness. Tenderness in the right-upper part of your abdomen. Weight loss and muscle loss. Nausea. Loss of appetite. Later  symptoms may include: Fatigue or weakness that is getting worse. Yellow skin and eyes (jaundice). Buildup of fluid in the abdomen (ascites). You may notice that your clothes are tight around your waist. Weight gain and swelling of the feet and ankles (edema). Trouble breathing. Easy bruising and bleeding. Vomiting blood, or black or bloody stool. Mental confusion. How is this diagnosed? Your health care provider may suspect cirrhosis based on your symptoms and medical history, especially if you have other medical conditions or a history of alcohol abuse. Your health care provider will do a physical exam to feel your liver and to check for signs of cirrhosis. Tests may include: Blood tests to check: For hepatitis B or C. Kidney function. Liver function. Imaging tests such as: MRI or CT scan to look for changes seen in advanced cirrhosis. Ultrasound to see if normal liver tissue is being replaced by scar tissue. A procedure in which a long needle is used to take a sample of liver tissue to be checked in a lab (biopsy). Liver biopsy can confirm the diagnosis of cirrhosis. How is this treated? Treatment for this condition depends on how damaged your liver is and what caused the damage. It may include treating the symptoms of cirrhosis, or treating the underlying causes to slow the damage. Treatment may include: Making lifestyle changes, such as: Eating a healthy diet. You may need to work with your health care provider or a dietitian to develop an eating plan. Restricting salt intake. Maintaining a healthy weight. Not abusing drugs or alcohol. Taking medicines to: Treat liver infections or other infections. Control itching. Reduce fluid buildup. Reduce certain blood toxins. Reduce  risk of bleeding from enlarged blood vessels in the stomach or esophagus (varices). Liver transplant. In this procedure, a liver from a donor is used to replace your diseased liver. This is done if cirrhosis has  caused liver failure. Other treatments and procedures may be done depending on the problems that you get from cirrhosis. Common problems include liver-related kidney failure (hepatorenal syndrome). Follow these instructions at home:  Take medicines only as told by your health care provider. Do not use medicines that are toxic to your liver. Ask your health care provider before taking any new medicines, including over-the-counter medicines such as NSAIDs. Rest as needed. Eat a well-balanced diet. Limit your salt or water intake, if your health care provider asks you to do this. Do not drink alcohol. This is especially important if you routinely take acetaminophen . Keep all follow-up visits. This is important. Contact a health care provider if you: Have fatigue or weakness that is getting worse. Develop swelling of the hands, feet, or legs, or a buildup of fluid in the abdomen (ascites). Have a fever or chills. Develop loss of appetite. Have nausea or vomiting. Develop jaundice. Develop easy bruising or bleeding. Get help right away if you: Vomit bright red blood or a material that looks like coffee grounds. Have blood in your stools. Notice that your stools appear black and tarry. Become confused. Have chest pain or trouble breathing. These symptoms may represent a serious problem that is an emergency. Do not wait to see if the symptoms will go away. Get medical help right away. Call your local emergency services (911 in the [place] Do not drive yourself to the hospital. Summary Cirrhosis is chronic liver injury. Common causes are hepatitis C and long-term alcohol abuse. Tests used to diagnose cirrhosis include blood tests, imaging tests, and liver biopsy. Treatment for this condition involves treating the underlying cause. Avoid alcohol, drugs, salt, and medicines that may damage your liver. Get help right away if you vomit bright red blood or a material that looks like coffee  grounds. This information is not intended to replace advice given to you by your health care provider. Make sure you discuss any questions you have with your health care provider. Document Revised: 02/28/2020 Document Reviewed: 02/28/2020 Elsevier Patient Education  2022 [employer].

## 2023-10-03 NOTE — Progress Notes (Addendum)
 10/03/2023 Alexandria Leblanc [id number] May 02, 1971  Referring provider: Gabriel John, NP Primary GI doctor: Dr. Rosaline Coma  ASSESSMENT AND PLAN:   Cirrhosis 07/19/2023 WBC 8.5 HGB Platelets 130.0 07/19/2023 AST 22 ALT 19 Alkphos 92 TBili 0.5 10/20/2021 INR 1.0 Get INR to calculate MELD  Serologic workup: negative 2023 Ascites:     None -Nutrition and low sodium diet discussed with patient and information given - appears euvolemic  Varices screening / surveillance EGD:    Last EGD 10/22/2021 No history of varices but patient did have portal hypertensive gastropathy. On prophylaxis propranolol  for 1 year for migraines  Hepatic encephalopathy:  Pt does not report any symptoms consistent with HE and no asterixis on exam.   Most recent HCC screening:    10/20/2021 CT angio abdomen pelvis with and without for abdominal pain shows cirrhosis no enhancing lesion, extensive cholelithiasis no biliary duct dilation normal pancreas normal spleen without reported focal liver lesions.   Will update HCC and AFP  Provided general information to the patient: -Continue daily multivitamin -Recommended 30 minutes of aerobic and resistance exercise 3 days/week -Encouraged pt to increase protein intake - patient lives far away, can see if her PCP feels comfortable doing INR, AFP, CBC, CMET, ammonia every 6 months and sending us  the labs.   Collagenous colitis From biopsies 10/22/2021 On metformin  switched from immediate to long acting, still having BM once at work, 2-3 times at home.  Usually loose, no hematochezia.  She is on benicar  as well for 2 years - consider switching Benicar  to something different - declines budesonide  due to Diabetes - can use pepto/imodium as needed  Personal history of benign polyps 10/22/2021 adequate prep normal TI, erythematous mucosa entire examined colon, one 2 mm polyp cecum, 2 polyps 4 to 5 mm sigmoid, 3 polyps 1 to 2 mm sigmoid nonbleeding internal  hemorrhoids.  Will condylomata perianal exam Pathology benign, recall 10 years  Condyloma on perianal exam Referred to surgery and had removed  CAD 09/2021 CT angio with mild to moderate stenosis of proximal SMA unlikely to be sufficient to cause chronic mesenteric ischemia, aortic atherosclerosis and coronary artery calcifications No chest pain, has SOB due to smoking  Diabetes On Metformin , on trulicity  Discussed GLP1 with the patient, mechanism of action. Gastroparesis diet given to the patient.  Patient should be instructed to hold this medications if dose falls within 7 days of endoscopic procedure, due to increased risk of retained gastric contents.  GERD Increase ompeprazole 20 mg BID but controls her symptoms Has some dsyphagia to water/liquids x 1 month no odynpohagia, declines EGD at this time unless needed, will talk with doctor, can consider repeat if any worsening symptoms  Obesity  Body mass index is 31.24 kg/m.  -Patient has been advised to make an attempt to improve diet and exercise patterns to aid in weight loss. -Recommended diet heavy in fruits and veggies and low in animal meats, cheeses, and dairy products, appropriate calorie intake   Patient Care Team: Gabriel John, NP as PCP - General (Internal Medicine) Gwyn Leos, MD as Consulting Physician (Internal Medicine)  HISTORY OF PRESENT ILLNESS: 53 y.o. female with medical history significant for alcohol use, GERD, hypertension, migraines, type 2 diabetes, obesity, tobacco use presents for follow up of cirrhosis secondary to ETOH.  Patient was last seen in the office 10/20/2021 by Loa Riling, PA. Has history of the following complications from cirrhosis: portal hypertensive gastropathy Works second shift, lives in [place] and  has to drive here.   Last EGD was 10/22/2021 EGD [facility] normal esophagus, portal hypertensive gastropathy, erythematous mucosa in antrum nodular mucosa in  duodenal bulb.  Negative H. pylori negative EOE negative celiac Last colonoscopy 10/22/2021 adequate prep normal TI, erythematous mucosa entire examined colon, one 2 mm polyp cecum, 2 polyps 4 to 5 mm sigmoid, 3 polyps 1 to 2 mm sigmoid nonbleeding internal hemorrhoids.  Will condylomata perianal exam.  Hyperplastic polyp, inflammatory polyp, colitis consistent with collagenous colitis recall 10 years. Last HCC screen: 09/2021 Last AFP remote Last INR: 10/20/2021 1.0   Discussed the use of AI scribe software for clinical note transcription with the patient, who gave verbal consent to proceed.  History of Present Illness   Alexandria Leblanc is a 53 year old female with cirrhosis who presents for follow-up.  She has a history of cirrhosis and was last seen in 2023. She underwent an EGD and colonoscopy at [facility], which showed portal hypertensive gastropathy but no esophageal varices. The colonoscopy revealed multiple hyperplastic polyps and collagenous colitis. No significant changes in her condition have been noted since her last visit.  She experiences diarrhea, which has improved since switching to extended-release metformin . She has three bowel movements a day, which are mostly loose. No blood or dark stools. She takes Benicar , which may contribute to her diarrhea.  She has a history of gallstones and experiences nausea, particularly when she does not eat, which she attributes to her use of Trulicity . No chest pain or shortness of breath, although she acknowledges being short of breath due to smoking.  She has been on omeprazole , increased to twice daily, to manage heartburn and reflux symptoms. She experiences occasional choking on liquids but no painful swallowing. No abdominal swelling but notes some swelling in her legs, which she attributes to standing all day and having neuropathy in her feet.  She has a history of varicose veins and spider angiomas on her legs and chest. She experiences  bruising at the site of her Trulicity  injections but no significant pain.  She consumes alcohol, having had three beers in the past week, and smokes cigarettes. She works second shift, which affects her sleep, often not sleeping until 3 or 4 AM. She has a family history of dementia and Alzheimer's, with her mother and sister affected.  She manages her mother's and sister's medications and takes them to appointments, which impacts her ability to focus on her own health.      Hepatitis immunity status:  10/20/2021 HepA REACTIVE  10/20/2021 HepBsAG NON-REACTIVE  10/20/2021 HepAsAB NON-REACTIVE  10/20/2021 HepCAB NON-REACTIVE   Social history:  She  reports that she has been smoking cigarettes. She has a 35 pack-year smoking history. She has never used smokeless tobacco. She reports current alcohol use of about 35.0 standard drinks of alcohol per week. She reports that she does not use drugs.  RELEVANT GI HISTORY, LABS, IMAGING:  CBC    Component Value Date/Time   WBC 8.5 07/19/2023 1158   RBC 4.14 07/19/2023 1158   HGB 12.9 07/19/2023 1158   HCT 37.9 07/19/2023 1158   PLT 130.0 (L) 07/19/2023 1158   MCV 91.5 07/19/2023 1158   MCH 29.9 11/18/2021 1415   MCHC 34.0 07/19/2023 1158   RDW 13.9 07/19/2023 1158   LYMPHSABS 2.5 07/19/2023 1158   MONOABS 0.6 07/19/2023 1158   EOSABS 0.4 07/19/2023 1158   BASOSABS 0.1 07/19/2023 1158   Recent Labs    07/19/23 1158  HGB  12.9    CMP     Component Value Date/Time   NA 132 (L) 07/19/2023 1158   K 4.4 07/19/2023 1158   CL 97 07/19/2023 1158   CO2 26 07/19/2023 1158   GLUCOSE 99 07/19/2023 1158   BUN 9 07/19/2023 1158   CREATININE 0.46 07/19/2023 1158   CALCIUM  9.5 07/19/2023 1158   PROT 7.5 07/19/2023 1158   ALBUMIN 4.0 07/19/2023 1158   AST 22 07/19/2023 1158   ALT 19 07/19/2023 1158   ALKPHOS 92 07/19/2023 1158   BILITOT 0.5 07/19/2023 1158   GFRNONAA >60 11/18/2021 1415   GFRAA >60 01/03/2020 0940      Latest Ref Rng &  Units 07/19/2023   11:58 AM 11/30/2022    3:13 PM 11/18/2021    2:15 PM  Hepatic Function  Total Protein 6.0 - 8.3 g/dL 7.5  7.4  8.5   Albumin 3.5 - 5.2 g/dL 4.0  3.7  3.9   AST 0 - 37 U/L 22  38  41   ALT 0 - 35 U/L 19  28  44   Alk Phosphatase 39 - 117 U/L 92  123  144   Total Bilirubin 0.2 - 1.2 mg/dL 0.5  0.4  0.6       Latest Ref Rng & Units 10/20/2021    9:45 AM  Hepatitis C  AFP ng/mL 3.0     Current Medications:   Current Outpatient Medications (Endocrine & Metabolic):    Dulaglutide  (TRULICITY ) 3 MG/0.5ML SOAJ, Inject 3 mg as directed once a week. for diabetes.   glipiZIDE  (GLUCOTROL  XL) 10 MG 24 hr tablet, TAKE ONE TABLET BY MOUTH ONCE DAILY WITH BREAKFAST FOR DIABETES   metFORMIN  (GLUCOPHAGE -XR) 500 MG 24 hr tablet, TAKE TWO TABLETS (1,000 MG TOTAL) BY MOUTH TWO TIMES DAILY WITH A MEAL FOR DIABETES.   predniSONE  (DELTASONE ) 20 MG tablet, Take two tablets my mouth once daily in the morning for four days, then one tablet once daily in the morning for four days.  Current Outpatient Medications (Cardiovascular):    amLODipine  (NORVASC ) 5 MG tablet, TAKE ONE TABLET (5 MG TOTAL) BY MOUTH DAILY FOR BLOOD PRESSURE.   hydrochlorothiazide  (HYDRODIURIL ) 25 MG tablet, TAKE ONE TABLET (25 MG TOTAL) BY MOUTH DAILY. FOR BLOOD PRESSURE.   olmesartan  (BENICAR ) 40 MG tablet, TAKE ONE TABLET BY MOUTH ONCE A DAY FOR BLOOD PRESSURE   pravastatin  (PRAVACHOL ) 40 MG tablet, TAKE ONE TABLET BY MOUTH ONCE A DAY FOR CHOLESTEROL   propranolol  ER (INDERAL  LA) 120 MG 24 hr capsule, TAKE ONE CAPSULE (120 MG TOTAL) BY MOUTH AT BEDTIME. FOR HEADACHE PREVENTION  Current Outpatient Medications (Respiratory):    albuterol  (VENTOLIN  HFA) 108 (90 Base) MCG/ACT inhaler, INHALE TWO PUFFS INTO LUNGS EVERY SIX HOURS AS NEEDED FOR WHEEZING OR SHORTNESS OF BREATH   cetirizine  (ZYRTEC ) 10 MG tablet, Take 1 tablet (10 mg total) by mouth daily. For allergies   fluticasone  (FLONASE ) 50 MCG/ACT nasal spray, SPRAY ONE  SPRAY INTO BOTH NOSTRILS TWO TIMES DAILY   fluticasone  furoate-vilanterol (BREO ELLIPTA ) 100-25 MCG/ACT AEPB, Inhale 1 puff into the lungs daily.   montelukast  (SINGULAIR ) 10 MG tablet, TAKE ONE TABLET BY MOUTH EVERY NIGHT AT BEDTIME FOR ALLERGIES AND ASTHMA  Current Outpatient Medications (Analgesics):    rizatriptan  (MAXALT ) 5 MG tablet, Take 1 tablet by mouth at migraine onset. May repeat in 2 hours if needed   Current Outpatient Medications (Other):    acyclovir  (ZOVIRAX ) 400 MG tablet, TAKE ONE TABLET BY  MOUTH TWICE A DAY FOR HERPES PREVENTION.   Blood Glucose Monitoring Suppl (FREESTYLE LITE) w/Device KIT, 1 each by Does not apply route as directed. DX E11.9   glucose blood test strip, Use to test blood sugars up to three times a day Dx E11.9   Lancets (FREESTYLE) lancets, 1 each by Other route 3 (three) times daily.   omeprazole  (PRILOSEC) 20 MG capsule, Take 1 capsule (20 mg total) by mouth 2 (two) times daily before a meal. for heartburn.   ondansetron  (ZOFRAN ) 4 MG tablet, Take 1 tablet (4 mg total) by mouth every 8 (eight) hours as needed for nausea or vomiting.  Medical History:  Past Medical History:  Diagnosis Date   Alcohol abuse    Anxiety    Arthritis    Asthma    Carpal tunnel syndrome, bilateral    Depression    DJD (degenerative joint disease)    lower back   Gallstones    Genital warts    GERD (gastroesophageal reflux disease)    Hepatic cirrhosis (HCC)    Hypertension    Migraines    Obesity    Portal hypertension with esophageal varices (HCC)    Splenomegaly    Thrombocytopenia (HCC)    Type 2 diabetes mellitus (HCC)    Allergies:  Allergies  Allergen Reactions   Penicillins Hives and Itching   Sulfa Antibiotics Hives     Surgical History:  She  has a past surgical history that includes Colonoscopy with propofol  (N/A, 10/22/2021); Esophagogastroduodenoscopy (egd) with propofol  (N/A, 10/22/2021); biopsy (10/22/2021); polypectomy (10/22/2021); Rectal  exam under anesthesia (N/A, 02/04/2022); and Lesion removal (N/A, 02/04/2022). Family History:  Her family history includes Alzheimer's disease in her sister; COPD in her father; Colon cancer in her paternal uncle; Dementia in her mother; Diabetes in her brother and mother; Heart attack in her mother; Heart disease in her mother; Hyperlipidemia in her brother; Hypertension in her father and mother; Prostate cancer in her paternal uncle.  REVIEW OF SYSTEMS  : All other systems reviewed and negative except where noted in the History of Present Illness.  PHYSICAL EXAM: BP 122/68   Pulse 66   Ht 5\' 4"  (1.626 m)   Wt 182 lb (82.6 kg)   LMP  (LMP Unknown)   BMI 31.24 kg/m  Physical Exam   GENERAL APPEARANCE: Well nourished, in no apparent distress. HEENT: No cervical lymphadenopathy, unremarkable thyroid , sclerae anicteric, conjunctiva pink. RESPIRATORY: Respiratory effort normal, bilateral wheezing on auscultation. CARDIO: RRR with no MRGs, peripheral pulses intact. ABDOMEN: Soft, non-distended, active bowel sounds in all 4 quadrants, epigastric fullness on palpation, not tender, hepatomegaly on palpation, bruising on lower abdomen from injections. RECTAL: Declines. MUSCULOSKELETAL: Full ROM, normal gait, without edema. SKIN: Dry, intact without rashes or lesions, spider angiomas on legs, chest, and abdomen. No jaundice. NEURO: Alert, oriented, no focal deficits, no asterixis. PSYCH: Cooperative, normal mood and affect. EXTREMITIES: Varicose veins on legs.       Edmonia Gottron, PA-C 11:57 AM

## 2023-10-04 ENCOUNTER — Other Ambulatory Visit: Payer: Self-pay | Admitting: Primary Care

## 2023-10-04 DIAGNOSIS — K219 Gastro-esophageal reflux disease without esophagitis: Secondary | ICD-10-CM

## 2023-10-04 LAB — AFP TUMOR MARKER: AFP-Tumor Marker: 3 ng/mL

## 2023-10-05 ENCOUNTER — Ambulatory Visit: Payer: PRIVATE HEALTH INSURANCE | Admitting: Primary Care

## 2023-10-05 ENCOUNTER — Encounter: Payer: Self-pay | Admitting: Primary Care

## 2023-10-05 ENCOUNTER — Ambulatory Visit (INDEPENDENT_AMBULATORY_CARE_PROVIDER_SITE_OTHER)
Admission: RE | Admit: 2023-10-05 | Discharge: 2023-10-05 | Disposition: A | Discharge status: Home / Self Care | Payer: PRIVATE HEALTH INSURANCE | Source: Ambulatory Visit | Attending: Primary Care

## 2023-10-05 VITALS — BP 144/78 | HR 72 | Temp 97.5°F | Ht 64.0 in | Wt 181.0 lb

## 2023-10-05 DIAGNOSIS — M79643 Pain in unspecified hand: Secondary | ICD-10-CM | POA: Diagnosis not present

## 2023-10-05 DIAGNOSIS — J4540 Moderate persistent asthma, uncomplicated: Secondary | ICD-10-CM

## 2023-10-05 DIAGNOSIS — M79642 Pain in left hand: Secondary | ICD-10-CM | POA: Diagnosis not present

## 2023-10-05 MED ORDER — FLUTICASONE FUROATE-VILANTEROL 100-25 MCG/ACT IN AEPB: 1.0000 | INHALATION_SPRAY | Freq: Every day | RESPIRATORY_TRACT | 2 refills | Status: DC

## 2023-10-05 MED ORDER — KETOROLAC TROMETHAMINE 60 MG/2ML IM SOLN
60.0000 mg | Freq: Once | INTRAMUSCULAR | Status: AC
  Administered 2023-10-05: 60 mg via INTRAMUSCULAR

## 2023-10-05 NOTE — Progress Notes (Addendum)
 Subjective:    Patient ID: Alexandria Leblanc, female    DOB: 1970/09/07, 53 y.o.   MRN: [id number]  Hand Injury    Alexandria Leblanc is a very pleasant 53 y.o. female with a history of hypertension, asthma, alcoholic cirrhosis, type 2 diabetes, thrombocytopenia, hyperlipidemia, splenomegaly who presents today to discuss hand pain.  Symptom onset yesterday with left dorsal hand pain. Her hand pain occurred after she slammed a chair under her dining room table. She hit her hand hard on the back of the chair as the chair hit the dining room table.   She had mild pain inially, but several hours later she developed increased pain and swelling. She tried wrapping her hand in an ACE bandage and applied topical pain gel without improvement.   Her current pain is located to the left dorsal and and wrist with radiation up her forearm. Pain is worse with any form of movement.   She is also needing refills of her Breo.    Review of Systems  Musculoskeletal:  Positive for arthralgias and joint swelling.  Skin:  Negative for color change and wound.         Past Medical History:  Diagnosis Date  . Alcohol abuse   . Anxiety   . Arthritis   . Asthma   . Carpal tunnel syndrome, bilateral   . Depression   . DJD (degenerative joint disease)    lower back  . Gallstones   . Genital warts   . GERD (gastroesophageal reflux disease)   . Hepatic cirrhosis (HCC)   . Hypertension   . Migraines   . Obesity   . Portal hypertension with esophageal varices (HCC)   . Splenomegaly   . Thrombocytopenia (HCC)   . Type 2 diabetes mellitus (HCC)     Social History   Socioeconomic History  . Marital status: Single    Spouse name: Not on file  . Number of children: 0  . Years of education: Not on file  . Highest education level: GED or equivalent  Occupational History  . Occupation: Conservation officer, nature  Tobacco Use  . Smoking status: Every Day    Current packs/day: 1.00    Average packs/day: 1 pack/day for  35.0 years (35.0 ttl pk-yrs)    Types: Cigarettes  . Smokeless tobacco: Never  Vaping Use  . Vaping status: Never Used  Substance and Sexual Activity  . Alcohol use: Yes    Alcohol/week: 35.0 standard drinks of alcohol    Types: 35 Cans of beer per week    Comment: 4 cans of beer 2x week  . Drug use: No  . Sexual activity: Not on file  Other Topics Concern  . 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 1and half a day- smoking; 5 beers a week; no hard liqor.           Social Drivers of Health   Financial Resource Strain: Low Risk  (07/14/2023)   Overall Financial Resource Strain (CARDIA)   . Difficulty of Paying Living Expenses: Not very hard  Food Insecurity: No Food Insecurity (07/14/2023)   Hunger Vital Sign   . Worried About Programme researcher, broadcasting/film/video in the Last Year: Never true   . Ran Out of Food in the Last Year: Never true  Transportation Needs: No Transportation Needs (07/14/2023)   PRAPARE - Transportation   .  Lack of Transportation (Medical): No   . Lack of Transportation (Non-Medical): No  Physical Activity: Inactive (07/14/2023)   Exercise Vital Sign   . Days of Exercise per Week: 0 days   . Minutes of Exercise per Session: 10 min  Stress: Stress Concern Present (07/14/2023)   [employer] of Occupational Health - Occupational Stress Questionnaire   . Feeling of Stress : Very much  Social Connections: Socially Isolated (07/14/2023)   Social Connection and Isolation Panel [NHANES]   . Frequency of Communication with Friends and Family: Once a week   . Frequency of Social Gatherings with Friends and Family: Never   . Attends Religious Services: Never   . Active Member of Clubs or Organizations: No   . Attends Banker Meetings: Not on file   . Marital Status: Never married  Intimate Partner Violence: Not on file    Past Surgical History:   Procedure Laterality Date  . BIOPSY  10/22/2021   Procedure: BIOPSY;  Surgeon: Daina Drum, MD;  Location: [facility];  Service: Gastroenterology;;  EGD and COLON  . COLONOSCOPY WITH PROPOFOL  N/A 10/22/2021   Procedure: COLONOSCOPY WITH PROPOFOL ;  Surgeon: Daina Drum, MD;  Location: [facility];  Service: Gastroenterology;  Laterality: N/A;  . ESOPHAGOGASTRODUODENOSCOPY (EGD) WITH PROPOFOL  N/A 10/22/2021   Procedure: ESOPHAGOGASTRODUODENOSCOPY (EGD) WITH PROPOFOL ;  Surgeon: Daina Drum, MD;  Location: [facility];  Service: Gastroenterology;  Laterality: N/A;  . LESION REMOVAL N/A 02/04/2022   Procedure: EXCISION OF PERIANAL LESION x2;  Surgeon: Melvenia Stabs, MD;  Location: [facility];  Service: General;  Laterality: N/A;  . POLYPECTOMY  10/22/2021   Procedure: POLYPECTOMY;  Surgeon: Daina Drum, MD;  Location: [facility];  Service: Gastroenterology;;  . RECTAL EXAM UNDER ANESTHESIA N/A 02/04/2022   Procedure: ANORECTAL EXAM UNDER ANESTHESIA;  Surgeon: Melvenia Stabs, MD;  Location: [facility];  Service: General;  Laterality: N/A;    Family History  Problem Relation Age of Onset  . Heart disease Mother   . Hypertension Mother   . Dementia Mother   . Diabetes Mother   . Heart attack Mother   . COPD Father   . Hypertension Father   . Alzheimer's disease Sister   . Hyperlipidemia Brother   . Diabetes Brother   . Prostate cancer Paternal Uncle   . Colon cancer Paternal Uncle     Allergies  Allergen Reactions  . Penicillins Hives and Itching  . Sulfa Antibiotics Hives    Current Outpatient Medications on File Prior to Visit  Medication Sig Dispense Refill  . acyclovir  (ZOVIRAX ) 400 MG tablet TAKE ONE TABLET BY MOUTH TWICE A DAY FOR HERPES PREVENTION. 180 tablet 0  . albuterol  (VENTOLIN  HFA) 108 (90 Base) MCG/ACT inhaler INHALE TWO PUFFS INTO LUNGS EVERY SIX HOURS AS NEEDED FOR WHEEZING OR SHORTNESS OF BREATH 8.5 g 0  .  amLODipine  (NORVASC ) 5 MG tablet TAKE ONE TABLET (5 MG TOTAL) BY MOUTH DAILY FOR BLOOD PRESSURE. 90 tablet 2  . Blood Glucose Monitoring Suppl (FREESTYLE LITE) w/Device KIT 1 each by Does not apply route as directed. DX E11.9 1 kit 0  . cetirizine  (ZYRTEC ) 10 MG tablet Take 1 tablet (10 mg total) by mouth daily. For allergies 90 tablet 3  . Dulaglutide  (TRULICITY ) 3 MG/0.5ML SOAJ Inject 3 mg as directed once a week. for diabetes. 6 mL 1  . fluticasone  (FLONASE ) 50 MCG/ACT nasal spray SPRAY ONE SPRAY INTO BOTH NOSTRILS TWO TIMES  DAILY 48 mL 2  . glipiZIDE  (GLUCOTROL  XL) 10 MG 24 hr tablet TAKE ONE TABLET BY MOUTH ONCE DAILY WITH BREAKFAST FOR DIABETES 90 tablet 0  . glucose blood test strip Use to test blood sugars up to three times a day Dx E11.9 200 each 3  . hydrochlorothiazide  (HYDRODIURIL ) 25 MG tablet TAKE ONE TABLET (25 MG TOTAL) BY MOUTH DAILY. FOR BLOOD PRESSURE. 90 tablet 0  . Lancets (FREESTYLE) lancets 1 each by Other route 3 (three) times daily. 100 each 2  . metFORMIN  (GLUCOPHAGE -XR) 500 MG 24 hr tablet TAKE TWO TABLETS (1,000 MG TOTAL) BY MOUTH TWO TIMES DAILY WITH A MEAL FOR DIABETES. 360 tablet 0  . montelukast  (SINGULAIR ) 10 MG tablet TAKE ONE TABLET BY MOUTH EVERY NIGHT AT BEDTIME FOR ALLERGIES AND ASTHMA 90 tablet 1  . olmesartan  (BENICAR ) 40 MG tablet TAKE ONE TABLET BY MOUTH ONCE A DAY FOR BLOOD PRESSURE 90 tablet 1  . omeprazole  (PRILOSEC) 20 MG capsule TAKE ONE CAPSULE (20 MG TOTAL) BY MOUTH TWO (TWO) TIMES DAILY BEFORE A MEAL. FOR HEARTBURN. 180 capsule 0  . ondansetron  (ZOFRAN ) 4 MG tablet Take 1 tablet (4 mg total) by mouth every 8 (eight) hours as needed for nausea or vomiting. 20 tablet 0  . pravastatin  (PRAVACHOL ) 40 MG tablet TAKE ONE TABLET BY MOUTH ONCE A DAY FOR CHOLESTEROL 90 tablet 1  . predniSONE  (DELTASONE ) 20 MG tablet Take two tablets my mouth once daily in the morning for four days, then one tablet once daily in the morning for four days. 12 tablet 0  .  propranolol  ER (INDERAL  LA) 120 MG 24 hr capsule TAKE ONE CAPSULE (120 MG TOTAL) BY MOUTH AT BEDTIME. FOR HEADACHE PREVENTION 90 capsule 0  . rizatriptan  (MAXALT ) 5 MG tablet Take 1 tablet by mouth at migraine onset. May repeat in 2 hours if needed 10 tablet 0   No current facility-administered medications on file prior to visit.    BP (!) 144/78   Pulse 72   Temp (!) 97.5 F (36.4 C) (Temporal)   Ht 5\' 4"  (1.626 m)   Wt 181 lb (82.1 kg)   LMP  (LMP Unknown)   SpO2 97%   BMI 31.07 kg/m  Objective:   Physical Exam Constitutional:      General: She is not in acute distress. Cardiovascular:     Pulses:          Radial pulses are 2+ on the left side.  Pulmonary:     Effort: Pulmonary effort is normal.  Musculoskeletal:     Left wrist: Swelling, tenderness and bony tenderness present. No deformity. Decreased range of motion. Normal pulse.     Left hand: Swelling, tenderness and bony tenderness present. Decreased range of motion.     Comments: Moderate swelling to entire dorsal hand with decrease in flexion of digits 2-5.   Decrease in ROM to left wrist due to pain.   Skin:    General: Skin is warm and dry.          Assessment & Plan:  Pain of hand after trauma Assessment & Plan: X-ray of the left hand and wrist ordered and pending today.  Toradol 60 mg provided today intramuscularly. Wrist brace applied today.  Await results. Will refer to ortho if needed.   Orders: -     DG Hand Complete Left  Moderate persistent asthma without complication -     Fluticasone  Furoate-Vilanterol; Inhale 1 puff into the lungs daily.  Dispense:  180 each; Refill: 2        Gabriel John, NP

## 2023-10-05 NOTE — Addendum Note (Signed)
 Addended by: Kaylen Nghiem K on: 10/05/2023 03:14 PM   Modules accepted: Orders

## 2023-10-05 NOTE — Telephone Encounter (Signed)
 Received a denial letter from [employer] saying pt has health ins that pays for prescription drug(Breo),gave pt a call letting her know the outcome of the application,gave [facility] a call to check copay they run it through pt copay is $10 per pharmacy pt needs a ne RX,pt is aware said she has an appt today and will ask Dr for a new prescription to be send to [facility].

## 2023-10-05 NOTE — Assessment & Plan Note (Signed)
 X-ray of the left hand and wrist ordered and pending today.  Toradol 60 mg provided today intramuscularly. Wrist brace applied today.  Await results. Will refer to ortho if needed.

## 2023-10-05 NOTE — Patient Instructions (Signed)
 Complete xray(s) prior to leaving today. I will notify you of your results once received.  Use the hand brace for support.   Keep your hand/arm elevated.   It was a pleasure to see you today!

## 2023-10-05 NOTE — Addendum Note (Signed)
 Addended by: Kyle Pho on: 10/05/2023 03:25 PM   Modules accepted: Orders

## 2023-10-11 ENCOUNTER — Ambulatory Visit: Payer: PRIVATE HEALTH INSURANCE

## 2023-10-12 ENCOUNTER — Ambulatory Visit (HOSPITAL_COMMUNITY): Payer: PRIVATE HEALTH INSURANCE

## 2023-10-17 NOTE — Progress Notes (Signed)
 I agree with the assessment and plan as outlined by Ms. Steffanie Dunn.

## 2023-10-21 ENCOUNTER — Other Ambulatory Visit: Payer: Self-pay | Admitting: Primary Care

## 2023-10-21 DIAGNOSIS — I10 Essential (primary) hypertension: Secondary | ICD-10-CM

## 2023-10-21 DIAGNOSIS — E785 Hyperlipidemia, unspecified: Secondary | ICD-10-CM

## 2023-10-21 DIAGNOSIS — J4540 Moderate persistent asthma, uncomplicated: Secondary | ICD-10-CM

## 2023-10-27 ENCOUNTER — Other Ambulatory Visit: Payer: Self-pay | Admitting: Primary Care

## 2023-10-27 DIAGNOSIS — A6000 Herpesviral infection of urogenital system, unspecified: Secondary | ICD-10-CM

## 2023-11-21 ENCOUNTER — Other Ambulatory Visit: Payer: Self-pay | Admitting: Primary Care

## 2023-11-21 DIAGNOSIS — R519 Headache, unspecified: Secondary | ICD-10-CM

## 2023-11-24 ENCOUNTER — Other Ambulatory Visit: Payer: Self-pay | Admitting: Primary Care

## 2023-11-24 DIAGNOSIS — I10 Essential (primary) hypertension: Secondary | ICD-10-CM

## 2023-12-15 ENCOUNTER — Emergency Department (HOSPITAL_BASED_OUTPATIENT_CLINIC_OR_DEPARTMENT_OTHER): Payer: PRIVATE HEALTH INSURANCE | Admitting: Radiology

## 2023-12-15 ENCOUNTER — Emergency Department (HOSPITAL_BASED_OUTPATIENT_CLINIC_OR_DEPARTMENT_OTHER): Payer: PRIVATE HEALTH INSURANCE

## 2023-12-15 ENCOUNTER — Emergency Department (HOSPITAL_BASED_OUTPATIENT_CLINIC_OR_DEPARTMENT_OTHER)
Admission: EM | Admit: 2023-12-15 | Discharge: 2023-12-15 | Disposition: A | Discharge status: Home / Self Care | Payer: PRIVATE HEALTH INSURANCE | Attending: Emergency Medicine | Admitting: Emergency Medicine

## 2023-12-15 ENCOUNTER — Other Ambulatory Visit (HOSPITAL_BASED_OUTPATIENT_CLINIC_OR_DEPARTMENT_OTHER): Payer: Self-pay

## 2023-12-15 ENCOUNTER — Encounter (HOSPITAL_BASED_OUTPATIENT_CLINIC_OR_DEPARTMENT_OTHER): Payer: Self-pay

## 2023-12-15 ENCOUNTER — Other Ambulatory Visit: Payer: Self-pay

## 2023-12-15 DIAGNOSIS — I10 Essential (primary) hypertension: Secondary | ICD-10-CM | POA: Insufficient documentation

## 2023-12-15 DIAGNOSIS — J45909 Unspecified asthma, uncomplicated: Secondary | ICD-10-CM | POA: Insufficient documentation

## 2023-12-15 DIAGNOSIS — W01198A Fall on same level from slipping, tripping and stumbling with subsequent striking against other object, initial encounter: Secondary | ICD-10-CM | POA: Diagnosis not present

## 2023-12-15 DIAGNOSIS — Z7951 Long term (current) use of inhaled steroids: Secondary | ICD-10-CM | POA: Diagnosis not present

## 2023-12-15 DIAGNOSIS — E119 Type 2 diabetes mellitus without complications: Secondary | ICD-10-CM | POA: Diagnosis not present

## 2023-12-15 DIAGNOSIS — W19XXXA Unspecified fall, initial encounter: Secondary | ICD-10-CM

## 2023-12-15 DIAGNOSIS — R0789 Other chest pain: Secondary | ICD-10-CM | POA: Diagnosis not present

## 2023-12-15 DIAGNOSIS — M25512 Pain in left shoulder: Secondary | ICD-10-CM | POA: Diagnosis not present

## 2023-12-15 DIAGNOSIS — M25522 Pain in left elbow: Secondary | ICD-10-CM | POA: Diagnosis present

## 2023-12-15 DIAGNOSIS — R519 Headache, unspecified: Secondary | ICD-10-CM | POA: Diagnosis not present

## 2023-12-15 DIAGNOSIS — Z7984 Long term (current) use of oral hypoglycemic drugs: Secondary | ICD-10-CM | POA: Insufficient documentation

## 2023-12-15 DIAGNOSIS — Y93E1 Activity, personal bathing and showering: Secondary | ICD-10-CM | POA: Insufficient documentation

## 2023-12-15 DIAGNOSIS — Z79899 Other long term (current) drug therapy: Secondary | ICD-10-CM | POA: Diagnosis not present

## 2023-12-15 DIAGNOSIS — S5002XA Contusion of left elbow, initial encounter: Secondary | ICD-10-CM | POA: Diagnosis not present

## 2023-12-15 MED ORDER — CELECOXIB 200 MG PO CAPS
200.0000 mg | ORAL_CAPSULE | Freq: Two times a day (BID) | ORAL | 0 refills | Status: AC | PRN
  Filled 2023-12-15: qty 30, 15d supply, fill #0

## 2023-12-15 MED ORDER — CYCLOBENZAPRINE HCL 10 MG PO TABS
10.0000 mg | ORAL_TABLET | Freq: Two times a day (BID) | ORAL | 0 refills | Status: AC | PRN
Start: 2023-12-15 — End: ?
  Filled 2023-12-15: qty 20, 10d supply, fill #0

## 2023-12-15 MED ORDER — ACETAMINOPHEN 325 MG PO TABS
650.0000 mg | ORAL_TABLET | Freq: Once | ORAL | Status: AC
  Administered 2023-12-15: 650 mg via ORAL
  Filled 2023-12-15: qty 2

## 2023-12-15 MED ORDER — CYCLOBENZAPRINE HCL 10 MG PO TABS
10.0000 mg | ORAL_TABLET | Freq: Once | ORAL | Status: AC
  Administered 2023-12-15: 10 mg via ORAL
  Filled 2023-12-15: qty 1

## 2023-12-15 NOTE — Discharge Instructions (Addendum)
 Your imaging studies were negative for any acute abnormality from the fall today.  Will send you home with medications to take for your pain.  Continue to use incentive spirometry at home to make sure you adequately taking deep breaths.  Recommend follow-up with your primary care for reassessment.  Please do not hesitate to return to emergency department if the worrisome signs and symptoms we discussed become apparent.

## 2023-12-15 NOTE — ED Notes (Signed)
 Pt instructed on Incentive Spirometer.  Pt performed well at 1250-1347ml X 5 attempts.  Pt understood usage and had no questions

## 2023-12-15 NOTE — ED Provider Notes (Signed)
 [place] [facility]ENC[facility] DEPARTMENT AT [facility] Provider Note   CSN: [id number] Arrival date & time: 12/15/23  1445     Patient presents with: Alexandria Leblanc is a 53 y.o. female.    Fall   53 year old female presents emergency department for fall.  Patient was in the shower when she went to turn and slipped causing her to fall on her left side.  States that she hit her left ribs/elbow on the wall at the top and then hit her head somewhere in the process of falling.  Denies LOC, blood thinner use.  Denies any current visual disturbance, gait abnormality, slurred speech, facial droop, weakness/sensory deficits in upper extremities.  Denies any shortness of breath, abdominal pain, nausea, vomiting.  Denies any pain elsewhere in upper or lower extremities.  Past medical history significant for diabetes mellitus type 2, portal hypertension with esophageal varices, hepatic cirrhosis, GERD, asthma, alcohol abuse  Prior to Admission medications   Medication Sig Start Date End Date Taking? Authorizing Provider  acyclovir  (ZOVIRAX ) 400 MG tablet TAKE ONE TABLET BY MOUTH TWICE A DAY FOR HERPES PREVENTION. 10/28/23   Gretta Comer POUR, NP  albuterol  (VENTOLIN  HFA) 108 (90 Base) MCG/ACT inhaler INHALE TWO PUFFS INTO LUNGS EVERY SIX HOURS AS NEEDED FOR WHEEZING OR SHORTNESS OF BREATH 08/22/23   Gretta Comer POUR, NP  amLODipine  (NORVASC ) 5 MG tablet TAKE ONE TABLET (5 MG TOTAL) BY MOUTH DAILY FOR BLOOD PRESSURE. 05/30/23   Clark, Katherine K, NP  Blood Glucose Monitoring Suppl (FREESTYLE LITE) w/Device KIT 1 each by Does not apply route as directed. DX E11.9 08/11/20   Gretta Comer POUR, NP  cetirizine  (ZYRTEC ) 10 MG tablet Take 1 tablet (10 mg total) by mouth daily. For allergies 03/02/23   Clark, Katherine K, NP  Dulaglutide  (TRULICITY ) 3 MG/0.5ML SOAJ Inject 3 mg as directed once a week. for diabetes. 09/27/23   Clark, Katherine K, NP  fluticasone  (FLONASE ) 50 MCG/ACT nasal spray  SPRAY ONE SPRAY INTO BOTH NOSTRILS TWO TIMES DAILY 05/30/23   Clark, Katherine K, NP  fluticasone  furoate-vilanterol (BREO ELLIPTA ) 100-25 MCG/ACT AEPB Inhale 1 puff into the lungs daily. 10/05/23   Clark, Katherine K, NP  glipiZIDE  (GLUCOTROL  XL) 10 MG 24 hr tablet TAKE ONE TABLET BY MOUTH ONCE DAILY WITH BREAKFAST FOR DIABETES 09/21/23   Clark, Katherine K, NP  glucose blood test strip Use to test blood sugars up to three times a day Dx E11.9 12/08/21   Clark, Katherine K, NP  hydrochlorothiazide  (HYDRODIURIL ) 25 MG tablet TAKE ONE TABLET (25 MG TOTAL) BY MOUTH DAILY. FOR BLOOD PRESSURE. 11/24/23   Clark, Katherine K, NP  Lancets (FREESTYLE) lancets 1 each by Other route 3 (three) times daily. 12/08/21   Clark, Katherine K, NP  metFORMIN  (GLUCOPHAGE -XR) 500 MG 24 hr tablet TAKE TWO TABLETS (1,000 MG TOTAL) BY MOUTH TWO TIMES DAILY WITH A MEAL FOR DIABETES. 09/26/23   Clark, Katherine K, NP  montelukast  (SINGULAIR ) 10 MG tablet TAKE ONE TABLET BY MOUTH EVERY NIGHT AT BEDTIME FOR ALLERGIES AND ASTHMA 10/21/23   Clark, Katherine K, NP  olmesartan  (BENICAR ) 40 MG tablet TAKE ONE TABLET BY MOUTH ONCE A DAY FOR BLOOD PRESSURE 10/21/23   Clark, Katherine K, NP  omeprazole  (PRILOSEC) 20 MG capsule TAKE ONE CAPSULE (20 MG TOTAL) BY MOUTH TWO (TWO) TIMES DAILY BEFORE A MEAL. FOR HEARTBURN. 10/04/23   Gretta Comer POUR, NP  ondansetron  (ZOFRAN ) 4 MG tablet Take 1 tablet (4 mg total) by mouth  every 8 (eight) hours as needed for nausea or vomiting. 09/29/21   Dugal, Tabitha, FNP  pravastatin  (PRAVACHOL ) 40 MG tablet TAKE ONE TABLET BY MOUTH ONCE A DAY FOR CHOLESTEROL 10/21/23   Clark, Katherine K, NP  predniSONE  (DELTASONE ) 20 MG tablet Take two tablets my mouth once daily in the morning for four days, then one tablet once daily in the morning for four days. 07/19/23   Gretta Comer POUR, NP  propranolol  ER (INDERAL  LA) 120 MG 24 hr capsule TAKE ONE CAPSULE (120 MG TOTAL) BY MOUTH AT BEDTIME. FOR HEADACHE PREVENTION 11/21/23    Clark, Katherine K, NP  rizatriptan  (MAXALT ) 5 MG tablet Take 1 tablet by mouth at migraine onset. May repeat in 2 hours if needed 08/09/23   Clark, Katherine K, NP    Allergies: Penicillins and Sulfa antibiotics    Review of Systems  All other systems reviewed and are negative.   Updated Vital Signs BP (!) 166/61   Pulse 68   Temp 98.6 F (37 C) (Oral)   Resp 20   Ht 5' 3 (1.6 m)   Wt 83.5 kg   LMP  (LMP Unknown)   SpO2 96%   BMI 32.59 kg/m   Physical Exam Vitals and nursing note reviewed.  Constitutional:      General: She is not in acute distress.    Appearance: She is well-developed.  HENT:     Head: Normocephalic.     Comments: Swelling left occipital region. Eyes:     Conjunctiva/sclera: Conjunctivae normal.  Cardiovascular:     Rate and Rhythm: Normal rate and regular rhythm.     Heart sounds: No murmur heard. Pulmonary:     Effort: Pulmonary effort is normal. No respiratory distress.     Breath sounds: Normal breath sounds.  Abdominal:     Palpations: Abdomen is soft.     Tenderness: There is no abdominal tenderness.  Musculoskeletal:        General: No swelling.     Cervical back: Neck supple.     Comments: No midline tenderness cervical, thoracic, lumbar spine without step-off or guarding.  Left-sided chest wall tenderness.  Patient with tenderness lateral malleolus left elbow as well as left proximal humerus, left distal clavicle.  Ecchymosis appreciated lateral aspect of elbow.  Able to range bilateral upper extremities fully.  No other tenderness reproducible bilateral lower extremities or upper extremities.  Skin:    General: Skin is warm and dry.     Capillary Refill: Capillary refill takes less than 2 seconds.  Neurological:     Mental Status: She is alert.     Comments: Alert and oriented to self, place, time and event.   Speech is fluent, clear without dysarthria or dysphasia.   Strength 5/5 in upper/lower extremities   Sensation intact in  upper/lower extremities   Normal gait.  CN I not tested  CN II not tested CN III, IV, VI PERRLA and EOMs intact bilaterally  CN V Intact sensation to sharp and light touch to the face  CN VII facial movements symmetric  CN VIII not tested  CN IX, X no uvula deviation, symmetric rise of soft palate  CN XI 5/5 SCM and trapezius strength bilaterally  CN XII Midline tongue protrusion, symmetric L/R movements     Psychiatric:        Mood and Affect: Mood normal.     (all labs ordered are listed, but only abnormal results are displayed) Labs Reviewed - No data  to display  EKG: None  Radiology: CT Head Wo Contrast Result Date: 12/15/2023 CLINICAL DATA:  Head trauma, moderate-severe EXAM: CT HEAD WITHOUT CONTRAST TECHNIQUE: Contiguous axial images were obtained from the base of the skull through the vertex without intravenous contrast. RADIATION DOSE REDUCTION: This exam was performed according to the departmental dose-optimization program which includes automated exposure control, adjustment of the mA and/or kV according to patient size and/or use of iterative reconstruction technique. COMPARISON:  None Available. FINDINGS: Brain: The ventricles appear age appropriate. No mass effect or midline shift. Gray-white differentiation is preserved without focal attenuation abnormality.No evidence of acute territorial infarction, extra-axial fluid collection, hemorrhage, or mass lesion. The basilar cisterns are patent without downward herniation. The cerebellar hemispheres and vermis are well formed without mass lesion or focal attenuation abnormality. Vascular: No hyperdense vessel. Calcified atherosclerotic plaque within the cavernous/supraclinoid internal carotid arteries. Skull: Normal. Negative for fracture or focal lesion. Sinuses/Orbits: The paranasal sinuses and mastoids are clear.The globes appear intact. No retrobulbar hematoma. Other: None. IMPRESSION: No acute intracranial abnormality,  specifically, no acute hemorrhage, territorial infarction, or intracranial mass. Electronically Signed   By: Rogelia Myers M.D.   On: 12/15/2023 16:17   DG Ribs Unilateral W/Chest Left Result Date: 12/15/2023 CLINICAL DATA:  Left rib pain. EXAM: LEFT RIBS AND CHEST - 3+ VIEW COMPARISON:  March 11, 2021. FINDINGS: No fracture or other bone lesions are seen involving the ribs. There is no evidence of pneumothorax or pleural effusion. Both lungs are clear. Heart size and mediastinal contours are within normal limits. IMPRESSION: Negative. Electronically Signed   By: Lynwood Landy Raddle M.D.   On: 12/15/2023 15:59   DG Elbow Complete Left Result Date: 12/15/2023 CLINICAL DATA:  Elbow pain EXAM: LEFT ELBOW - COMPLETE 3+ VIEW COMPARISON:  None Available. FINDINGS: There is no evidence of fracture, dislocation, or joint effusion. There is no evidence of arthropathy or other focal bone abnormality. There is medial soft tissue swelling. IMPRESSION: Medial soft tissue swelling. No fracture or dislocation. Electronically Signed   By: Greig Pique M.D.   On: 12/15/2023 15:57     Procedures   Medications Ordered in the ED  acetaminophen  (TYLENOL ) tablet 650 mg (650 mg Oral Given 12/15/23 1603)  cyclobenzaprine  (FLEXERIL ) tablet 10 mg (10 mg Oral Given 12/15/23 1603)                                    Medical Decision Making Amount and/or Complexity of Data Reviewed Radiology: ordered.  Risk OTC drugs. Prescription drug management.   This patient presents to the ED for concern of fall, this involves an extensive number of treatment options, and is a complaint that carries with it a high risk of complications and morbidity.  The differential diagnosis includes CVA, fracture, strain/sprain, dislocation, ligamentous/tendinous injury, neurovascular compromise, pneumothorax, intra-abdominal organ damage, other   Co morbidities that complicate the patient evaluation  See HPI   Additional history  obtained:  Additional history obtained from EMR External records from outside source obtained and reviewed including hospital records   Lab Tests:  N/a   Imaging Studies ordered:  I ordered imaging studies including CT head, left elbow x-ray, chest x-ray with left ribs, left shoulder x-ray I independently visualized and interpreted imaging which showed  CT head: No acute abnormality Left elbow x-ray: Soft tissue swelling.  No acute osseous abnormality. Chest x-ray with left ribs: Negative Left shoulder x-ray:  Negative I agree with the radiologist interpretation   Cardiac Monitoring: / EKG:  N/a   Consultations Obtained:  N/a   Problem List / ED Course / Critical interventions / Medication management  Fall I ordered medication including tylenol , flexeril     Reevaluation of the patient after these medicines showed that the patient improved I have reviewed the patients home medicines and have made adjustments as needed   Social Determinants of Health:  Chronic cigarette use.  Alcohol abuse.   Test / Admission - Considered:  Fall Vitals signs significant for hypertension blood pressure 166/61. Otherwise within normal range and stable throughout visit. Imaging studies significant for: See above 53 year old female presents emergency department for fall.  Patient was in the shower when she went to turn and slipped causing her to fall on her left side.  States that she hit her left ribs/elbow on the wall at the top and then hit her head somewhere in the process of falling.  Denies LOC, blood thinner use.  Denies any current visual disturbance, gait abnormality, slurred speech, facial droop, weakness/sensory deficits in upper extremities.  Denies any shortness of breath, abdominal pain, nausea, vomiting.  Denies any pain elsewhere in upper or lower extremities. On exam, swelling left occipital region, reproducible tenderness left shoulder, left elbow, left rib as above.   Imaging studies obtained triage area negative for any acute traumatic injury including CT head, left shoulder x-ray, chest x-ray with left ribs, left elbow x-ray.  Patient reassured by findings.  Given incentive spirometry in the ED to ensure adequate full breaths are being taken.  Recommend symptomatic therapy as described in AVS to follow-up with PCP in the outpatient setting for reassessment.  Treatment plan discussed with patient and she denies understanding and was agreeable to said plan.  Patient well-appearing, afebrile in no acute distress. Worrisome signs and symptoms were discussed with the patient, and the patient acknowledged understanding to return to the ED if noticed. Patient was stable upon discharge.       Final diagnoses:  None    ED Discharge Orders     None          Silver Wonda LABOR, [place] 12/15/23 1706    Lenor Hollering, MD 12/15/23 2100

## 2023-12-15 NOTE — ED Triage Notes (Signed)
 Pt POV from home c/o injuries from falling in shower. Pt states she slipped, Denies LOC and anticoags. Left posterior head pain, left elbow pain, left rib pain. CAOx4.

## 2023-12-19 ENCOUNTER — Other Ambulatory Visit: Payer: Self-pay | Admitting: Primary Care

## 2023-12-19 DIAGNOSIS — E1165 Type 2 diabetes mellitus with hyperglycemia: Secondary | ICD-10-CM

## 2023-12-22 ENCOUNTER — Other Ambulatory Visit: Payer: Self-pay | Admitting: Primary Care

## 2023-12-22 DIAGNOSIS — K219 Gastro-esophageal reflux disease without esophagitis: Secondary | ICD-10-CM

## 2024-01-02 ENCOUNTER — Other Ambulatory Visit: Payer: Self-pay | Admitting: Primary Care

## 2024-01-02 DIAGNOSIS — J4540 Moderate persistent asthma, uncomplicated: Secondary | ICD-10-CM

## 2024-01-03 ENCOUNTER — Encounter: Payer: Self-pay | Admitting: Primary Care

## 2024-01-03 ENCOUNTER — Other Ambulatory Visit: Payer: Self-pay

## 2024-01-03 ENCOUNTER — Ambulatory Visit: Payer: PRIVATE HEALTH INSURANCE | Admitting: Primary Care

## 2024-01-03 VITALS — BP 118/62 | HR 8 | Temp 97.2°F | Ht 63.0 in | Wt 183.0 lb

## 2024-01-03 DIAGNOSIS — R918 Other nonspecific abnormal finding of lung field: Secondary | ICD-10-CM

## 2024-01-03 DIAGNOSIS — Z122 Encounter for screening for malignant neoplasm of respiratory organs: Secondary | ICD-10-CM

## 2024-01-03 DIAGNOSIS — E1165 Type 2 diabetes mellitus with hyperglycemia: Secondary | ICD-10-CM

## 2024-01-03 DIAGNOSIS — Z7984 Long term (current) use of oral hypoglycemic drugs: Secondary | ICD-10-CM

## 2024-01-03 DIAGNOSIS — Z1231 Encounter for screening mammogram for malignant neoplasm of breast: Secondary | ICD-10-CM

## 2024-01-03 DIAGNOSIS — D696 Thrombocytopenia, unspecified: Secondary | ICD-10-CM

## 2024-01-03 DIAGNOSIS — Z0000 Encounter for general adult medical examination without abnormal findings: Secondary | ICD-10-CM | POA: Insufficient documentation

## 2024-01-03 DIAGNOSIS — F17210 Nicotine dependence, cigarettes, uncomplicated: Secondary | ICD-10-CM

## 2024-01-03 DIAGNOSIS — E785 Hyperlipidemia, unspecified: Secondary | ICD-10-CM

## 2024-01-03 DIAGNOSIS — A6000 Herpesviral infection of urogenital system, unspecified: Secondary | ICD-10-CM

## 2024-01-03 DIAGNOSIS — Z23 Encounter for immunization: Secondary | ICD-10-CM

## 2024-01-03 DIAGNOSIS — K219 Gastro-esophageal reflux disease without esophagitis: Secondary | ICD-10-CM

## 2024-01-03 DIAGNOSIS — J4540 Moderate persistent asthma, uncomplicated: Secondary | ICD-10-CM

## 2024-01-03 DIAGNOSIS — I10 Essential (primary) hypertension: Secondary | ICD-10-CM | POA: Diagnosis not present

## 2024-01-03 DIAGNOSIS — Z87891 Personal history of nicotine dependence: Secondary | ICD-10-CM

## 2024-01-03 DIAGNOSIS — K7030 Alcoholic cirrhosis of liver without ascites: Secondary | ICD-10-CM

## 2024-01-03 DIAGNOSIS — R519 Headache, unspecified: Secondary | ICD-10-CM

## 2024-01-03 NOTE — Assessment & Plan Note (Signed)
 Following with GI, office notes reviewed from May 2025.  Advised to stop drinking alcohol.

## 2024-01-03 NOTE — Assessment & Plan Note (Signed)
 Stable.  Continue Breo Ellipta  100-25 mcg 1 puff daily. Continue Singulair  10 mg HS. Continue albuterol  inhaler PRN.

## 2024-01-03 NOTE — Assessment & Plan Note (Signed)
 Due for repeat CT chest, unfortunately this was cost prohibitive.  Will refer to lung cancer screening program.

## 2024-01-03 NOTE — Patient Instructions (Signed)
 You will receive a phone call regarding the lung cancer screening program.  [facility] to [facility] your [facility].   Schedule a lab appointment in November for labs for your GI doctor.  Please schedule a follow up visit for 6 months for a diabetes check.  It was a pleasure to see you today!

## 2024-01-03 NOTE — Assessment & Plan Note (Signed)
 Second Shingrix  vaccine provided.  Pap smear overdue, she declines today desp.ite recommendations  Mammogram due, orders placed. Colonoscopy UTD, due 2033.  Discussed the importance of a healthy diet and regular exercise in order for weight loss, and to reduce the risk of further co-morbidity.  Exam stable. Labs pending.  Follow up in 1 year for repeat physical.

## 2024-01-03 NOTE — Assessment & Plan Note (Addendum)
Repeat lipid panel pending. Continue pravastatin 40 mg daily. 

## 2024-01-03 NOTE — Assessment & Plan Note (Signed)
 Following with GI for alcoholic cirrhosis. Office notes and labs reviewed from May 2025.

## 2024-01-03 NOTE — Assessment & Plan Note (Signed)
 Improved!  Continue propranolol  ER 120 mg daily.

## 2024-01-03 NOTE — Assessment & Plan Note (Signed)
 Controlled.  Continue acyclovir  400 mg twice daily

## 2024-01-03 NOTE — Assessment & Plan Note (Addendum)
 Stable.  Continue omeprazole  20 mg twice daily. Continue Tums PRN.

## 2024-01-03 NOTE — Progress Notes (Signed)
 Subjective:    Patient ID: Alexandria Leblanc, female    DOB: April 25, 1971, 53 y.o.   MRN: [id number]  HPI  Alexandria Leblanc is a very pleasant 53 y.o. female who presents today for complete physical and follow up of chronic conditions.  Immunizations: -Tetanus: Completed in 2022  -Shingles: Completed Shingrix  x 1 vaccine -Pneumonia: Completed Pneumovax 23 in 2017  Diet: Fair diet.  Exercise: No regular exercise.  Eye exam: Completes annually  Dental exam: Completes semi-annually    Pap Smear: Completed in 2020 Mammogram: Completed years ago.   Colonoscopy: Completed in 2023, due 2033 Lung Cancer Screening: Completed in 2022  BP Readings from Last 3 Encounters:  01/03/24 118/62  12/15/23 (!) 166/61  10/05/23 (!) 144/78       Review of Systems  Constitutional:  Negative for unexpected weight change.  HENT:  Negative for rhinorrhea.   Respiratory:  Negative for cough and shortness of breath.   Cardiovascular:  Negative for chest pain.  Gastrointestinal:  Negative for constipation and diarrhea.  Genitourinary:  Negative for difficulty urinating.  Musculoskeletal:  Positive for arthralgias and back pain.  Skin:  Negative for rash.  Allergic/Immunologic: Negative for environmental allergies.  Neurological:  Negative for dizziness and headaches.  Psychiatric/Behavioral:  The patient is not nervous/anxious.          Past Medical History:  Diagnosis Date   Alcohol abuse    Anxiety    Arthritis    Asthma    Carpal tunnel syndrome, bilateral    Depression    DJD (degenerative joint disease)    lower back   Gallstones    Genital warts    GERD (gastroesophageal reflux disease)    Hepatic cirrhosis (HCC)    Hypertension    Migraines    Obesity    Portal hypertension with esophageal varices (HCC)    Right lower quadrant abdominal pain 09/29/2021   Splenomegaly    Thrombocytopenia (HCC)    Type 2 diabetes mellitus (HCC)     Social History   Socioeconomic  History   Marital status: Single    Spouse name: Not on file   Number of children: 0   Years of education: Not on file   Highest education level: GED or equivalent  Occupational History   Occupation: Conservation officer, nature  Tobacco Use   Smoking status: Every Day    Current packs/day: 1.00    Average packs/day: 1 pack/day for 35.0 years (35.0 ttl pk-yrs)    Types: Cigarettes   Smokeless tobacco: Never  Vaping Use   Vaping status: Never Used  Substance and Sexual Activity   Alcohol use: Yes    Alcohol/week: 35.0 standard drinks of alcohol    Types: 35 Cans of beer per week    Comment: 4 cans of beer 2x week   Drug use: No   Sexual activity: Not on file  Other Topics Concern   Not on file  Social History Narrative   Single; No children.; 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 1and half a day- smoking; 5 beers a week; no hard liqor.           Social Drivers of Corporate investment banker Strain: Low Risk  (01/02/2024)   Overall Financial Resource Strain (CARDIA)    Difficulty of Paying Living Expenses: Not hard at all  Food Insecurity: No Food Insecurity (01/02/2024)   Hunger Vital Sign    Worried About  Running Out of Food in the Last Year: Never true    Ran Out of Food in the Last Year: Never true  Transportation Needs: No Transportation Needs (01/02/2024)   PRAPARE - Administrator, Civil Service (Medical): No    Lack of Transportation (Non-Medical): No  Physical Activity: Inactive (01/02/2024)   Exercise Vital Sign    Days of Exercise per Week: 0 days    Minutes of Exercise per Session: Not on file  Stress: Stress Concern Present (01/02/2024)   [employer] of Occupational Health - Occupational Stress Questionnaire    Feeling of Stress: Very much  Social Connections: Socially Isolated (01/02/2024)   Social Connection and Isolation Panel    Frequency of Communication with Friends and Family: Never    Frequency of Social  Gatherings with Friends and Family: Never    Attends Religious Services: Never    Database administrator or Organizations: No    Attends Engineer, structural: Not on file    Marital Status: Never married  Intimate Partner Violence: Not on file    Past Surgical History:  Procedure Laterality Date   BIOPSY  10/22/2021   Procedure: BIOPSY;  Surgeon: Federico Rosario BROCKS, MD;  Location: [facility];  Service: Gastroenterology;;  EGD and COLON   COLONOSCOPY WITH PROPOFOL  N/A 10/22/2021   Procedure: COLONOSCOPY WITH PROPOFOL ;  Surgeon: Federico Rosario BROCKS, MD;  Location: [facility];  Service: Gastroenterology;  Laterality: N/A;   ESOPHAGOGASTRODUODENOSCOPY (EGD) WITH PROPOFOL  N/A 10/22/2021   Procedure: ESOPHAGOGASTRODUODENOSCOPY (EGD) WITH PROPOFOL ;  Surgeon: Federico Rosario BROCKS, MD;  Location: [facility];  Service: Gastroenterology;  Laterality: N/A;   LESION REMOVAL N/A 02/04/2022   Procedure: EXCISION OF PERIANAL LESION x2;  Surgeon: Teresa Lonni HERO, MD;  Location: [facility];  Service: General;  Laterality: N/A;   POLYPECTOMY  10/22/2021   Procedure: POLYPECTOMY;  Surgeon: Federico Rosario BROCKS, MD;  Location: [facility];  Service: Gastroenterology;;   RECTAL EXAM UNDER ANESTHESIA N/A 02/04/2022   Procedure: ANORECTAL EXAM UNDER ANESTHESIA;  Surgeon: Teresa Lonni HERO, MD;  Location: [facility];  Service: General;  Laterality: N/A;    Family History  Problem Relation Age of Onset   Heart disease Mother    Hypertension Mother    Dementia Mother    Diabetes Mother    Heart attack Mother    COPD Father    Hypertension Father    Alzheimer's disease Sister    Hyperlipidemia Brother    Diabetes Brother    Prostate cancer Paternal Uncle    Colon cancer Paternal Uncle     Allergies  Allergen Reactions   Penicillins Hives and Itching   Sulfa Antibiotics Hives    Current Outpatient Medications on File Prior to Visit  Medication Sig Dispense Refill    acyclovir  (ZOVIRAX ) 400 MG tablet TAKE ONE TABLET BY MOUTH TWICE A DAY FOR HERPES PREVENTION. 180 tablet 1   albuterol  (VENTOLIN  HFA) 108 (90 Base) MCG/ACT inhaler INHALE TWO PUFFS INTO LUNGS EVERY SIX HOURS AS NEEDED FOR WHEEZING OR SHORTNESS OF BREATH 8.5 g 0   amLODipine  (NORVASC ) 5 MG tablet TAKE ONE TABLET (5 MG TOTAL) BY MOUTH DAILY FOR BLOOD PRESSURE. 90 tablet 2   Blood Glucose Monitoring Suppl (FREESTYLE LITE) w/Device KIT 1 each by Does not apply route as directed. DX E11.9 1 kit 0   cetirizine  (ZYRTEC ) 10 MG tablet Take 1 tablet (10 mg total) by mouth daily. For allergies 90 tablet 3  Dulaglutide  (TRULICITY ) 3 MG/0.5ML SOAJ Inject 3 mg as directed once a week. for diabetes. 6 mL 1   fluticasone  (FLONASE ) 50 MCG/ACT nasal spray SPRAY ONE SPRAY INTO BOTH NOSTRILS TWO TIMES DAILY 48 mL 2   fluticasone  furoate-vilanterol (BREO ELLIPTA ) 100-25 MCG/ACT AEPB Inhale 1 puff into the lungs daily. 180 each 2   glipiZIDE  (GLUCOTROL  XL) 10 MG 24 hr tablet TAKE ONE TABLET BY MOUTH ONCE DAILY WITH BREAKFAST FOR DIABETES 90 tablet 0   glucose blood test strip Use to test blood sugars up to three times a day Dx E11.9 200 each 3   hydrochlorothiazide  (HYDRODIURIL ) 25 MG tablet TAKE ONE TABLET (25 MG TOTAL) BY MOUTH DAILY. FOR BLOOD PRESSURE. 90 tablet 1   Lancets (FREESTYLE) lancets 1 each by Other route 3 (three) times daily. 100 each 2   metFORMIN  (GLUCOPHAGE -XR) 500 MG 24 hr tablet TAKE TWO TABLETS (1,000 MG TOTAL) BY MOUTH TWO TIMES DAILY WITH A MEAL FOR DIABETES. 360 tablet 0   montelukast  (SINGULAIR ) 10 MG tablet TAKE ONE TABLET BY MOUTH EVERY NIGHT AT BEDTIME FOR ALLERGIES AND ASTHMA 90 tablet 0   olmesartan  (BENICAR ) 40 MG tablet TAKE ONE TABLET BY MOUTH ONCE A DAY FOR BLOOD PRESSURE 90 tablet 0   omeprazole  (PRILOSEC) 20 MG capsule TAKE ONE CAPSULE (20 MG TOTAL) BY MOUTH TWO (TWO) TIMES DAILY BEFORE A MEAL. FOR HEARTBURN. 180 capsule 0   pravastatin  (PRAVACHOL ) 40 MG tablet TAKE ONE TABLET BY  MOUTH ONCE A DAY FOR CHOLESTEROL 90 tablet 0   propranolol  ER (INDERAL  LA) 120 MG 24 hr capsule TAKE ONE CAPSULE (120 MG TOTAL) BY MOUTH AT BEDTIME. FOR HEADACHE PREVENTION 90 capsule 1   rizatriptan  (MAXALT ) 5 MG tablet Take 1 tablet by mouth at migraine onset. May repeat in 2 hours if needed 10 tablet 0   celecoxib  (CELEBREX ) 200 MG capsule Take 1 capsule (200 mg total) by mouth 2 (two) times daily as needed. (Patient not taking: Reported on 01/03/2024) 30 capsule 0   cyclobenzaprine  (FLEXERIL ) 10 MG tablet Take 1 tablet (10 mg total) by mouth 2 (two) times daily as needed for muscle spasms. (Patient not taking: Reported on 01/03/2024) 20 tablet 0   No current facility-administered medications on file prior to visit.    BP 118/62   Pulse (!) 8   Temp (!) 97.2 F (36.2 C) (Temporal)   Ht 5' 3 (1.6 m)   Wt 183 lb (83 kg)   LMP  (LMP Unknown)   SpO2 96%   BMI 32.42 kg/m  Objective:   Physical Exam HENT:     Right Ear: Tympanic membrane and ear canal normal.     Left Ear: Tympanic membrane and ear canal normal.  Eyes:     Pupils: Pupils are equal, round, and reactive to light.  Cardiovascular:     Rate and Rhythm: Normal rate and regular rhythm.  Pulmonary:     Effort: Pulmonary effort is normal.     Breath sounds: Normal breath sounds.  Abdominal:     General: Bowel sounds are normal.     Palpations: Abdomen is soft.     Tenderness: There is no abdominal tenderness.  Musculoskeletal:        General: Normal range of motion.     Cervical back: Neck supple.  Skin:    General: Skin is warm and dry.  Neurological:     Mental Status: She is alert and oriented to person, place, and time.  Cranial Nerves: No cranial nerve deficit.     Deep Tendon Reflexes:     Reflex Scores:      Patellar reflexes are 2+ on the right side and 2+ on the left side. Psychiatric:        Mood and Affect: Mood normal.           Assessment & Plan:  Preventative health care Assessment &  Plan: Second Shingrix  vaccine provided.  Pap smear overdue, she declines today desp.ite recommendations  Mammogram due, orders placed. Colonoscopy UTD, due 2033.  Discussed the importance of a healthy diet and regular exercise in order for weight loss, and to reduce the risk of further co-morbidity.  Exam stable. Labs pending.  Follow up in 1 year for repeat physical.    Essential hypertension Assessment & Plan: Controlled.  Continue amlodipine  5 mg daily, hydrochlorothiazide  25 mg daily, olmesartan  40 mg daily. BMP pending.  Orders: -     Basic metabolic panel with GFR  Moderate persistent asthma without complication Assessment & Plan: Stable.  Continue Breo Ellipta  100-25 mcg 1 puff daily. Continue Singulair  10 mg HS. Continue albuterol  inhaler PRN.   Pulmonary nodules Assessment & Plan: Due for repeat CT chest, unfortunately this was cost prohibitive.  Will refer to lung cancer screening program.    Alcoholic cirrhosis of liver without ascites [facility]) Assessment & Plan: Following with GI, office notes reviewed from May 2025.  Advised to stop drinking alcohol.     Gastroesophageal reflux disease, unspecified whether esophagitis present Assessment & Plan: Stable.  Continue omeprazole  20 mg twice daily. Continue Tums PRN.   Type 2 diabetes mellitus with hyperglycemia, without long-term current use of insulin  (HCC) Assessment & Plan: Repeat A1C pending.   Continue metformin  ER 1000 mg BID, Glipizide  XL 10 mg daily, Trulicity  3 mg weekly.  Repeat urine microalbumin pending.  Orders: -     Hemoglobin A1c -     Lipid panel -     Microalbumin / creatinine urine ratio  Screening mammogram for breast cancer -     3D Screening Mammogram, Left and Right; Future  Screening for lung cancer -     Ambulatory Referral for Lung Cancer Scre  Genital herpes simplex, unspecified site Assessment & Plan: Controlled.  Continue acyclovir  400 mg twice  daily   Thrombocytopenia (HCC) Assessment & Plan: Following with GI for alcoholic cirrhosis. Office notes and labs reviewed from May 2025.   Frequent headaches Assessment & Plan: Improved!  Continue propranolol  ER 120 mg daily.   Hyperlipidemia, unspecified hyperlipidemia type Assessment & Plan: Repeat lipid panel pending. Continue pravastatin  40 mg daily.         Alexandria Paczkowski K Graceann Boileau, NP

## 2024-01-03 NOTE — Assessment & Plan Note (Signed)
 Controlled.  Continue amlodipine  5 mg daily, hydrochlorothiazide  25 mg daily, olmesartan  40 mg daily. BMP pending.

## 2024-01-03 NOTE — Assessment & Plan Note (Signed)
 Repeat A1C pending.   Continue metformin  ER 1000 mg BID, Glipizide  XL 10 mg daily, Trulicity  3 mg weekly.  Repeat urine microalbumin pending.

## 2024-01-04 LAB — MICROALBUMIN / CREATININE URINE RATIO
Creatinine,U: 39.4 mg/dL
Microalb Creat Ratio: 69.8 mg/g — ABNORMAL HIGH (ref 0.0–30.0)
Microalb, Ur: 2.8 mg/dL — ABNORMAL HIGH (ref 0.0–1.9)

## 2024-01-04 LAB — LIPID PANEL
Cholesterol: 141 mg/dL (ref 0–200)
HDL: 43.9 mg/dL (ref >39.00)
LDL Cholesterol: 71 mg/dL (ref 0–99)
NonHDL: 97.22
Total CHOL/HDL Ratio: 3
Triglycerides: 129 mg/dL (ref 0.0–149.0)
VLDL: 25.8 mg/dL (ref 0.0–40.0)

## 2024-01-04 LAB — HEMOGLOBIN A1C: Hgb A1c MFr Bld: 6.5 % (ref 4.6–6.5)

## 2024-01-04 LAB — BASIC METABOLIC PANEL WITH GFR
BUN: 7 mg/dL (ref 6–23)
CO2: 27 meq/L (ref 19–32)
Calcium: 8.9 mg/dL (ref 8.4–10.5)
Chloride: 100 meq/L (ref 96–112)
Creatinine, Ser: 0.4 mg/dL (ref 0.40–1.20)
GFR: 112.97 mL/min (ref >60.00)
Glucose, Bld: 181 mg/dL — ABNORMAL HIGH (ref 70–99)
Potassium: 4.2 meq/L (ref 3.5–5.1)
Sodium: 135 meq/L (ref 135–145)

## 2024-01-05 ENCOUNTER — Ambulatory Visit: Payer: Self-pay | Admitting: Primary Care

## 2024-01-05 DIAGNOSIS — K7030 Alcoholic cirrhosis of liver without ascites: Secondary | ICD-10-CM

## 2024-01-06 ENCOUNTER — Other Ambulatory Visit: Payer: Self-pay | Admitting: Primary Care

## 2024-01-06 DIAGNOSIS — E1165 Type 2 diabetes mellitus with hyperglycemia: Secondary | ICD-10-CM

## 2024-01-09 ENCOUNTER — Other Ambulatory Visit: Payer: Self-pay | Admitting: Family

## 2024-01-11 ENCOUNTER — Other Ambulatory Visit: Payer: Self-pay | Admitting: Primary Care

## 2024-01-11 DIAGNOSIS — E785 Hyperlipidemia, unspecified: Secondary | ICD-10-CM

## 2024-01-23 ENCOUNTER — Other Ambulatory Visit: Payer: Self-pay | Admitting: Primary Care

## 2024-01-23 DIAGNOSIS — J4540 Moderate persistent asthma, uncomplicated: Secondary | ICD-10-CM

## 2024-01-27 DIAGNOSIS — E1165 Type 2 diabetes mellitus with hyperglycemia: Secondary | ICD-10-CM

## 2024-01-29 MED ORDER — TRULICITY 3 MG/0.5ML ~~LOC~~ SOAJ: 3.0000 mg | SUBCUTANEOUS | 1 refills | Status: DC

## 2024-01-31 MED ORDER — TRULICITY 3 MG/0.5ML ~~LOC~~ SOAJ: 3.0000 mg | SUBCUTANEOUS | 1 refills | Status: AC

## 2024-02-02 ENCOUNTER — Other Ambulatory Visit: Payer: Self-pay | Admitting: Primary Care

## 2024-02-02 DIAGNOSIS — I10 Essential (primary) hypertension: Secondary | ICD-10-CM

## 2024-02-27 ENCOUNTER — Ambulatory Visit: Admission: RE | Admit: 2024-02-27 | Payer: PRIVATE HEALTH INSURANCE | Source: Ambulatory Visit

## 2024-03-02 ENCOUNTER — Other Ambulatory Visit: Payer: Self-pay | Admitting: Primary Care

## 2024-03-02 DIAGNOSIS — J309 Allergic rhinitis, unspecified: Secondary | ICD-10-CM

## 2024-03-12 ENCOUNTER — Other Ambulatory Visit: Payer: Self-pay | Admitting: Primary Care

## 2024-03-12 DIAGNOSIS — K219 Gastro-esophageal reflux disease without esophagitis: Secondary | ICD-10-CM

## 2024-03-12 DIAGNOSIS — E1165 Type 2 diabetes mellitus with hyperglycemia: Secondary | ICD-10-CM

## 2024-03-29 ENCOUNTER — Other Ambulatory Visit: Payer: Self-pay | Admitting: Primary Care

## 2024-03-29 DIAGNOSIS — J4540 Moderate persistent asthma, uncomplicated: Secondary | ICD-10-CM

## 2024-03-29 DIAGNOSIS — I10 Essential (primary) hypertension: Secondary | ICD-10-CM

## 2024-04-03 ENCOUNTER — Other Ambulatory Visit (INDEPENDENT_AMBULATORY_CARE_PROVIDER_SITE_OTHER): Payer: PRIVATE HEALTH INSURANCE

## 2024-04-03 ENCOUNTER — Encounter: Payer: PRIVATE HEALTH INSURANCE | Admitting: Primary Care

## 2024-04-03 DIAGNOSIS — K7030 Alcoholic cirrhosis of liver without ascites: Secondary | ICD-10-CM | POA: Diagnosis not present

## 2024-04-03 DIAGNOSIS — J4540 Moderate persistent asthma, uncomplicated: Secondary | ICD-10-CM

## 2024-04-03 LAB — COMPREHENSIVE METABOLIC PANEL WITH GFR
ALT: 38 U/L — ABNORMAL HIGH (ref 0–35)
AST: 24 U/L (ref 0–37)
Albumin: 4.1 g/dL (ref 3.5–5.2)
Alkaline Phosphatase: 105 U/L (ref 39–117)
BUN: 9 mg/dL (ref 6–23)
CO2: 24 meq/L (ref 19–32)
Calcium: 9.4 mg/dL (ref 8.4–10.5)
Chloride: 99 meq/L (ref 96–112)
Creatinine, Ser: 0.43 mg/dL (ref 0.40–1.20)
GFR: 110.83 mL/min (ref >60.00)
Glucose, Bld: 112 mg/dL — ABNORMAL HIGH (ref 70–99)
Potassium: 4.4 meq/L (ref 3.5–5.1)
Sodium: 134 meq/L — ABNORMAL LOW (ref 135–145)
Total Bilirubin: 0.4 mg/dL (ref 0.2–1.2)
Total Protein: 7.9 g/dL (ref 6.0–8.3)

## 2024-04-03 LAB — CBC
HCT: 37.9 % (ref 36.0–46.0)
Hemoglobin: 12.7 g/dL (ref 12.0–15.0)
MCHC: 33.5 g/dL (ref 30.0–36.0)
MCV: 88.5 fl (ref 78.0–100.0)
Platelets: 145 K/uL — ABNORMAL LOW (ref 150.0–400.0)
RBC: 4.28 Mil/uL (ref 3.87–5.11)
RDW: 14.1 % (ref 11.5–15.5)
WBC: 7.6 K/uL (ref 4.0–10.5)

## 2024-04-03 LAB — PROTIME-INR
INR: 1 ratio (ref 0.8–1.0)
Prothrombin Time: 11.1 s (ref 9.6–13.1)

## 2024-04-03 LAB — AMMONIA: Ammonia: 49 umol/L — ABNORMAL HIGH (ref 11–35)

## 2024-04-04 ENCOUNTER — Ambulatory Visit: Payer: Self-pay | Admitting: Primary Care

## 2024-04-04 DIAGNOSIS — K7030 Alcoholic cirrhosis of liver without ascites: Secondary | ICD-10-CM

## 2024-04-04 MED ORDER — ALBUTEROL SULFATE HFA 108 (90 BASE) MCG/ACT IN AERS: INHALATION_SPRAY | RESPIRATORY_TRACT | 0 refills | Status: AC

## 2024-04-05 LAB — AFP TUMOR MARKER: AFP-Tumor Marker: 2.5 ng/mL

## 2024-04-10 ENCOUNTER — Other Ambulatory Visit: Payer: Self-pay | Admitting: Primary Care

## 2024-04-10 ENCOUNTER — Ambulatory Visit (INDEPENDENT_AMBULATORY_CARE_PROVIDER_SITE_OTHER): Payer: PRIVATE HEALTH INSURANCE | Admitting: Primary Care

## 2024-04-10 ENCOUNTER — Encounter: Payer: Self-pay | Admitting: Primary Care

## 2024-04-10 ENCOUNTER — Ambulatory Visit: Payer: Self-pay | Admitting: Primary Care

## 2024-04-10 VITALS — BP 134/66 | HR 72 | Temp 98.4°F | Ht 61.75 in | Wt 187.1 lb

## 2024-04-10 DIAGNOSIS — M79673 Pain in unspecified foot: Secondary | ICD-10-CM

## 2024-04-10 DIAGNOSIS — Z23 Encounter for immunization: Secondary | ICD-10-CM

## 2024-04-10 DIAGNOSIS — E1165 Type 2 diabetes mellitus with hyperglycemia: Secondary | ICD-10-CM | POA: Diagnosis not present

## 2024-04-10 DIAGNOSIS — F4323 Adjustment disorder with mixed anxiety and depressed mood: Secondary | ICD-10-CM | POA: Insufficient documentation

## 2024-04-10 DIAGNOSIS — A6000 Herpesviral infection of urogenital system, unspecified: Secondary | ICD-10-CM

## 2024-04-10 DIAGNOSIS — G8929 Other chronic pain: Secondary | ICD-10-CM

## 2024-04-10 LAB — POCT GLYCOSYLATED HEMOGLOBIN (HGB A1C): Hemoglobin A1C: 6.5 % — AB (ref 4.0–5.6)

## 2024-04-10 MED ORDER — SERTRALINE HCL 25 MG PO TABS: 25.0000 mg | ORAL_TABLET | Freq: Every day | ORAL | 0 refills | Status: DC

## 2024-04-10 NOTE — Assessment & Plan Note (Signed)
 Condolences provided today.  We discussed options for treatment including therapy versus medication.  She opts for medication.  Start sertraline (Zoloft)  25 mg tablets for anxiety and depression.   We discussed potential side effects in instructions for administration.  She will update in 4 weeks.

## 2024-04-10 NOTE — Assessment & Plan Note (Addendum)
 Stable with A1c at 6.5 today  Continue metformin  ER 1000 mg twice daily, Trulicity  3 mg weekly, glipizide  XL 10 mg daily. Referral placed to podiatry for ongoing paresthesias.  Evaluated for neuropathy versus plantar fasciitis  Follow-up in 6 months

## 2024-04-10 NOTE — Patient Instructions (Addendum)
 Start sertraline (Zoloft) 25 mg once daily for anxiety/depression.  Please update me in 4 weeks.   Please schedule a follow up visit for 6 months for a diabetes check.  It was a pleasure to see you today!

## 2024-04-10 NOTE — Progress Notes (Signed)
 Subjective:    Patient ID: Alexandria Leblanc, female    DOB: 12/03/1970, 53 y.o.   MRN: [id number]  [name] is a very pleasant 53 y.o. female with a history of hypertension, type 2 diabetes, cirrhosis, chronic back pain who presents today for follow-up diabetes.  1) Type 2 Diabetes:  Current medications include: Glipizide  XL 10 mg daily, metformin  ER 1000 mg twice daily, Trulicity  3 mg weekly  She is checking her blood glucose 0 times daily.  Last A1C: 6.5 in August 2025, 6.5 today. Last Eye Exam: Due Last Foot Exam: Up-to-date Pneumonia Vaccination: 2017 Urine Microalbumin: Up-to-date Statin: Pravastatin    Dietary changes since last visit: Decreased appetite. 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    Exercise: None.   Wt Readings from Last 3 Encounters:  04/10/24 187 lb 2 oz (84.9 kg)  01/03/24 183 lb (83 kg)  12/15/23 184 lb (83.5 kg)   BP Readings from Last 3 Encounters:  04/10/24 134/66  01/03/24 118/62  12/15/23 (!) 166/61   2) Grief/GAD/Depression: Chronic for years. Worse over the last few weeks with the passing of her mother. She's feeling very overwhelmed, anxious, down/depressed, worried, irritable. She is her sister's executor who is on disability. Lots of family stress. She is interested in treatment. Never undergone treatment.     Review of Systems  Respiratory:  Negative for shortness of breath.   Cardiovascular:  Negative for chest pain.  Neurological:  Positive for numbness.  Psychiatric/Behavioral:  The patient is nervous/anxious.        Grieving the loss of her mother, see HPI         Past Medical History:  Diagnosis Date   Alcohol abuse    Allergy    Sulfa and penicillin   Anxiety    Arthritis    Asthma    Cant breathe good sometimes because i smoke.   Carpal tunnel syndrome, bilateral    Depression    DJD (degenerative joint disease)    lower back   Gallstones    Genital warts    GERD (gastroesophageal reflux disease)     About 20 years ago i noticed i would get sick after i ate.   Hepatic cirrhosis (HCC)    Hypertension    Its been about 15 years ago when i was told   Migraines    Obesity    Portal hypertension with esophageal varices (HCC)    Right lower quadrant abdominal pain 09/29/2021   Splenomegaly    Thrombocytopenia    Type 2 diabetes mellitus (HCC)     Social History   Socioeconomic History   Marital status: Single    Spouse name: Not on file   Number of children: 0   Years of education: Not on file   Highest education level: GED or equivalent  Occupational History   Occupation: conservation officer, nature  Tobacco Use   Smoking status: Every Day    Current packs/day: 1.00    Average packs/day: 1.1 packs/day for 46.7 years (52.5 ttl pk-yrs)    Types: Cigarettes   Smokeless tobacco: Never  Vaping Use   Vaping status: Never Used  Substance and Sexual Activity   Alcohol use: Yes    Alcohol/week: 35.0 standard drinks of alcohol    Types: 35 Cans of beer per week    Comment: 4 cans of beer 2x week   Drug use: Never   Sexual activity: Not Currently    Birth control/protection: None  Comment: Havent had sex in 9 years  Other Topics Concern   Not on file  Social History Narrative   Single; No children.; Works as a Chartered Certified Accountant in a [employer] level of education GED; lives with mother. 1and half a day- smoking; 5 beers a week; no hard liqor.           Social Drivers of Corporate Investment Banker Strain: Low Risk  (04/06/2024)   Overall Financial Resource Strain (CARDIA)    Difficulty of Paying Living Expenses: Not hard at all  Food Insecurity: No Food Insecurity (04/06/2024)   Hunger Vital Sign    Worried About Running Out of Food in the Last Year: Never true    Ran Out of Food in the Last Year: Never true  Transportation Needs: No Transportation Needs (04/06/2024)   PRAPARE - Administrator, Civil Service (Medical): No    Lack of Transportation (Non-Medical): No   Physical Activity: Inactive (04/06/2024)   Exercise Vital Sign    Days of Exercise per Week: 1 day    Minutes of Exercise per Session: 0 min  Stress: Stress Concern Present (04/06/2024)   [employer] of Occupational Health - Occupational Stress Questionnaire    Feeling of Stress: Very much  Social Connections: Socially Isolated (04/06/2024)   Social Connection and Isolation Panel    Frequency of Communication with Friends and Family: Twice a week    Frequency of Social Gatherings with Friends and Family: Once a week    Attends Religious Services: Never    Database Administrator or Organizations: No    Attends Engineer, Structural: Not on file    Marital Status: Never married  Intimate Partner Violence: Not on file    Past Surgical History:  Procedure Laterality Date   BIOPSY  10/22/2021   Procedure: BIOPSY;  Surgeon: Federico Rosario BROCKS, MD;  Location: [facility];  Service: Gastroenterology;;  EGD and COLON   COLONOSCOPY WITH PROPOFOL  N/A 10/22/2021   Procedure: COLONOSCOPY WITH PROPOFOL ;  Surgeon: Federico Rosario BROCKS, MD;  Location: [facility];  Service: Gastroenterology;  Laterality: N/A;   ESOPHAGOGASTRODUODENOSCOPY (EGD) WITH PROPOFOL  N/A 10/22/2021   Procedure: ESOPHAGOGASTRODUODENOSCOPY (EGD) WITH PROPOFOL ;  Surgeon: Federico Rosario BROCKS, MD;  Location: [facility];  Service: Gastroenterology;  Laterality: N/A;   LESION REMOVAL N/A 02/04/2022   Procedure: EXCISION OF PERIANAL LESION x2;  Surgeon: Teresa Lonni HERO, MD;  Location: [facility];  Service: General;  Laterality: N/A;   POLYPECTOMY  10/22/2021   Procedure: POLYPECTOMY;  Surgeon: Federico Rosario BROCKS, MD;  Location: [facility];  Service: Gastroenterology;;   RECTAL EXAM UNDER ANESTHESIA N/A 02/04/2022   Procedure: ANORECTAL EXAM UNDER ANESTHESIA;  Surgeon: Teresa Lonni HERO, MD;  Location: [facility];  Service: General;  Laterality: N/A;    Family History  Problem Relation Age of Onset    Heart disease Mother    Hypertension Mother    Dementia Mother    Diabetes Mother    Heart attack Mother    Arthritis Mother    Asthma Mother    Stroke Mother    Varicose Veins Mother    COPD Father    Hypertension Father    Alzheimer's disease Sister    Hyperlipidemia Brother    Diabetes Brother    Prostate cancer Paternal Uncle    Colon cancer Paternal Uncle    Alcohol abuse Sister    Miscarriages / Stillbirths Sister  Allergies  Allergen Reactions   Penicillins Hives and Itching   Sulfa Antibiotics Hives    Current Outpatient Medications on File Prior to Visit  Medication Sig Dispense Refill   albuterol  (VENTOLIN  HFA) 108 (90 Base) MCG/ACT inhaler INHALE TWO PUFFS INTO LUNGS EVERY SIX HOURS AS NEEDED FOR WHEEZING OR SHORTNESS OF BREATH 8.5 g 0   amLODipine  (NORVASC ) 5 MG tablet TAKE ONE TABLET (5 MG TOTAL) BY MOUTH DAILY FOR BLOOD PRESSURE. 90 tablet 2   Blood Glucose Monitoring Suppl (FREESTYLE LITE) w/Device KIT 1 each by Does not apply route as directed. DX E11.9 1 kit 0   celecoxib  (CELEBREX ) 200 MG capsule Take 1 capsule (200 mg total) by mouth 2 (two) times daily as needed. 30 capsule 0   cetirizine  (ZYRTEC ) 10 MG tablet TAKE ONE TABLET (10 MG TOTAL) BY MOUTH DAILY FOR ALLERGIES 90 tablet 3   cyclobenzaprine  (FLEXERIL ) 10 MG tablet Take 1 tablet (10 mg total) by mouth 2 (two) times daily as needed for muscle spasms. 20 tablet 0   Dulaglutide  (TRULICITY ) 3 MG/0.5ML SOAJ Inject 3 mg as directed once a week. for diabetes. 6 mL 1   fluticasone  (FLONASE ) 50 MCG/ACT nasal spray SPRAY ONE SPRAY INTO BOTH NOSTRILS TWO TIMES DAILY 48 mL 2   fluticasone  furoate-vilanterol (BREO ELLIPTA ) 100-25 MCG/ACT AEPB Inhale 1 puff into the lungs daily. 180 each 2   glipiZIDE  (GLUCOTROL  XL) 10 MG 24 hr tablet TAKE ONE TABLET BY MOUTH ONCE DAILY WITH BREAKFAST FOR DIABETES 90 tablet 1   glucose blood test strip Use to test blood sugars up to three times a day Dx E11.9 200 each 3    hydrochlorothiazide  (HYDRODIURIL ) 25 MG tablet TAKE ONE TABLET (25 MG TOTAL) BY MOUTH DAILY. FOR BLOOD PRESSURE. 90 tablet 1   Lancets (FREESTYLE) lancets 1 each by Other route 3 (three) times daily. 100 each 2   metFORMIN  (GLUCOPHAGE -XR) 500 MG 24 hr tablet TAKE TWO TABLETS (1,000 MG TOTAL) BY MOUTH TWO TIMES DAILY WITH A MEAL FOR DIABETES. 360 tablet 1   montelukast  (SINGULAIR ) 10 MG tablet TAKE ONE TABLET BY MOUTH EVERY NIGHT AT BEDTIME FOR ALLERGIES AND ASTHMA 90 tablet 2   olmesartan  (BENICAR ) 40 MG tablet TAKE ONE TABLET BY MOUTH ONCE A DAY FOR BLOOD PRESSURE 90 tablet 2   omeprazole  (PRILOSEC) 20 MG capsule TAKE ONE CAPSULE (20 MG TOTAL) BY MOUTH TWO TIMES DAILY BEFORE A MEAL. FOR HEARTBURN. 180 capsule 2   pravastatin  (PRAVACHOL ) 40 MG tablet TAKE ONE TABLET BY MOUTH ONCE A DAY FOR CHOLESTEROL 90 tablet 3   propranolol  ER (INDERAL  LA) 120 MG 24 hr capsule TAKE ONE CAPSULE (120 MG TOTAL) BY MOUTH AT BEDTIME. FOR HEADACHE PREVENTION 90 capsule 1   rizatriptan  (MAXALT ) 5 MG tablet Take 1 tablet by mouth at migraine onset. May repeat in 2 hours if needed 10 tablet 0   No current facility-administered medications on file prior to visit.    BP 134/66   Pulse 72   Temp 98.4 F (36.9 C) (Oral)   Ht 5' 1.75 (1.568 m)   Wt 187 lb 2 oz (84.9 kg)   LMP  (LMP Unknown)   SpO2 94%   BMI 34.50 kg/m  Objective:   Physical Exam Cardiovascular:     Rate and Rhythm: Normal rate and regular rhythm.  Pulmonary:     Effort: Pulmonary effort is normal.     Breath sounds: Normal breath sounds.  Musculoskeletal:     Cervical back: Neck  supple.  Skin:    General: Skin is warm and dry.  Neurological:     Mental Status: She is alert and oriented to person, place, and time.  Psychiatric:        Mood and Affect: Mood normal.     Physical Exam        Assessment & Plan:  Type 2 diabetes mellitus with hyperglycemia, without long-term current use of insulin  (HCC) Assessment & Plan: Stable  with A1c at 6.5 today  Continue metformin  ER 1000 mg twice daily, Trulicity  3 mg weekly, glipizide  XL 10 mg daily.  Follow-up in 6 months  Orders: -     POCT glycosylated hemoglobin (Hb A1C)  Need for influenza vaccination -     Flu vaccine trivalent PF, 6mos and older(Flulaval,Afluria,Fluarix,Fluzone)  Adjustment reaction with anxiety and depression Assessment & Plan: Condolences provided today.  We discussed options for treatment including therapy versus medication.  She opts for medication.  Start sertraline (Zoloft)  25 mg tablets for anxiety and depression.   We discussed potential side effects in instructions for administration.  She will update in 4 weeks.   Orders: -     Sertraline HCl; Take 1 tablet (25 mg total) by mouth daily. for anxiety and depression.  Dispense: 90 tablet; Refill: 0    Assessment and Plan Assessment & Plan         Comer MARLA Gaskins, NP    History of Present Illness

## 2024-04-16 ENCOUNTER — Ambulatory Visit: Payer: PRIVATE HEALTH INSURANCE

## 2024-04-20 ENCOUNTER — Ambulatory Visit
Admission: RE | Admit: 2024-04-20 | Discharge: 2024-04-20 | Disposition: A | Discharge status: Home / Self Care | Payer: PRIVATE HEALTH INSURANCE | Source: Ambulatory Visit | Attending: Physician Assistant | Admitting: Physician Assistant

## 2024-04-20 ENCOUNTER — Ambulatory Visit: Payer: Self-pay | Admitting: Physician Assistant

## 2024-04-20 DIAGNOSIS — K7030 Alcoholic cirrhosis of liver without ascites: Secondary | ICD-10-CM | POA: Insufficient documentation

## 2024-04-23 ENCOUNTER — Encounter: Payer: Self-pay | Admitting: Podiatry

## 2024-04-23 ENCOUNTER — Ambulatory Visit: Payer: PRIVATE HEALTH INSURANCE

## 2024-04-23 ENCOUNTER — Ambulatory Visit: Payer: PRIVATE HEALTH INSURANCE | Admitting: Podiatry

## 2024-04-23 DIAGNOSIS — E1142 Type 2 diabetes mellitus with diabetic polyneuropathy: Secondary | ICD-10-CM | POA: Diagnosis not present

## 2024-04-23 NOTE — Patient Instructions (Signed)
  VISIT SUMMARY: Today, we discussed the burning pain in your feet, which you have been experiencing for the past two years, as well as your history of diabetes and back issues. We reviewed your current A1c level and discussed the importance of maintaining good blood sugar control to prevent further complications.  YOUR PLAN: -PAINFUL DIABETIC POLYNEUROPATHY OF BILATERAL FEET: This condition is a type of nerve damage caused by long-standing diabetes, leading to burning pain and a pins-and-needles sensation in your feet. We have referred you to Dr. Lazarus at the [facility] for further evaluation and management of your neuropathy and back issues. Please inspect your feet daily for any issues and report any findings immediately.  -CHRONIC LOW BACK PAIN WITH SCIATICA: This is a condition where you experience persistent pain in your lower back that radiates down your legs, possibly contributing to your neuropathy symptoms. We have referred you to Dr. Lazarus at the [facility] for further evaluation and management of your back pain and sciatica.  -TYPE 2 DIABETES MELLITUS: This is a chronic condition that affects the way your body processes blood sugar. Your current A1c level is 6.5, which is an improvement from previous levels. It is important to continue monitoring and maintaining your A1c levels to prevent further nerve damage. Please continue with your current diabetes management plan.  INSTRUCTIONS: Please follow up with Dr. Lazarus at the [facility] for evaluation and management of your neuropathy and back issues. Continue to monitor your A1c levels and maintain good blood sugar control. Inspect your feet daily and report any issues immediately.                      Contains text generated by Abridge.                                 Contains text generated by Abridge.

## 2024-04-24 ENCOUNTER — Other Ambulatory Visit: Payer: Self-pay | Admitting: Primary Care

## 2024-04-24 DIAGNOSIS — J4540 Moderate persistent asthma, uncomplicated: Secondary | ICD-10-CM

## 2024-04-25 NOTE — Progress Notes (Signed)
 Subjective:  Patient ID: Alexandria Leblanc, female    DOB: 16-Nov-1970,  MRN: [id number]  Chief Complaint  Patient presents with   Diabetes    New Patient Diabetic foot care - worsening burning in both feet x 2 years. A1C 6.5 Burning sensation covers the whole foot - She works at [employer] and has to stand for long periods of time    Discussed the use of AI scribe software for clinical note transcription with the patient, who gave verbal consent to proceed.  History of Present Illness Alexandria Leblanc is a 53 year old female with diabetes who presents with burning pain in her feet.  She has been experiencing a burning sensation in her feet for the past two years, with the right foot being more affected than the left. The sensation is described as feeling like her feet are 'completely on fire,' and it is present all over the feet. She is not currently using any medications for the burning sensation, such as gabapentin or Lyrica, and has not used these medications in the past.  She has a history of diabetes, diagnosed approximately eight years ago, with her most recent A1c level at 6.5. She notes that her A1c was previously higher, although she does not specify how high. No reported issues with kidney function.  She also reports a history of back issues, including sciatica and an unspecified issue with the lumbar spine, for which she received injections once without significant relief. She is not currently seeing anyone for her back issues. She mentions that her legs sometimes go numb at work and occasionally experiences weakness in her legs.      Objective:    Physical Exam VASCULAR: DP and PT pulse palpable. Foot is warm and well-perfused. Capillary fill time is brisk. DERMATOLOGIC: Normal skin turgor, texture, and temperature. No open lesions, rashes, or ulcerations. NEUROLOGIC: Diffuse polyneuropathy.  Abnormal sensation to light touch and pressure. No paresthesias on  examination. ORTHOPEDIC: Smooth, pain-free range of motion of all examined joints. No ecchymosis or bruising. No gross deformity. Non-tender. No tarsal tunnel syndrome.   No images are attached to the encounter.    Results LABS   Hemoglobin A1c: 6.5%   Assessment:   1. Diabetic polyneuropathy associated with type 2 diabetes mellitus (HCC)      Plan:  Patient was evaluated and treated and all questions answered.  Assessment and Plan Assessment & Plan Painful diabetic polyneuropathy of bilateral feet Chronic burning pain in both feet for two years, with the right foot more affected. Likely diabetic neuropathy due to long-standing diabetes. Symptoms include burning sensation and pins and needles. No numbness or motor weakness, indicating sensory neuropathy. No open ulcerations or lesions, and good circulation. Risk of infection if A1c remains elevated and unnoticed injuries occur. - Referred to Dr. Lazarus at the [facility] for evaluation and management of neuropathy and back issues. - Advised daily foot inspection for any issues and to report any findings.  Chronic low back pain with sciatica Chronic low back pain with sciatica, possibly contributing to neuropathy symptoms. Previous injections were ineffective. No recent follow-up with a specialist. Potential motor symptoms noted, warranting re-evaluation. - Referred to Dr. Lazarus at the [facility] for evaluation and management of back pain and sciatica.  Type 2 diabetes mellitus Diagnosed eight years ago. Current A1c is 6.5, previously higher. Diabetes likely contributing to neuropathy. Emphasis on maintaining A1c control to prevent further nerve damage. - Continue to monitor A1c  levels and maintain control to prevent further neuropathy progression.      No follow-ups on file.

## 2024-05-08 ENCOUNTER — Other Ambulatory Visit: Payer: Self-pay | Admitting: Primary Care

## 2024-05-08 DIAGNOSIS — I10 Essential (primary) hypertension: Secondary | ICD-10-CM

## 2024-05-11 ENCOUNTER — Other Ambulatory Visit: Payer: Self-pay | Admitting: Primary Care

## 2024-05-11 DIAGNOSIS — R519 Headache, unspecified: Secondary | ICD-10-CM

## 2024-06-05 ENCOUNTER — Other Ambulatory Visit: Payer: Self-pay | Admitting: Primary Care

## 2024-06-05 DIAGNOSIS — J4540 Moderate persistent asthma, uncomplicated: Secondary | ICD-10-CM

## 2024-06-27 ENCOUNTER — Other Ambulatory Visit: Payer: Self-pay | Admitting: Primary Care

## 2024-06-27 DIAGNOSIS — E1165 Type 2 diabetes mellitus with hyperglycemia: Secondary | ICD-10-CM

## 2024-07-10 ENCOUNTER — Ambulatory Visit: Payer: PRIVATE HEALTH INSURANCE | Admitting: Primary Care

## 2024-07-17 ENCOUNTER — Ambulatory Visit: Payer: PRIVATE HEALTH INSURANCE | Admitting: Student in an Organized Health Care Education/Training Program

## 2024-10-09 ENCOUNTER — Ambulatory Visit: Payer: PRIVATE HEALTH INSURANCE | Admitting: Primary Care
